# Patient Record
Sex: Female | Born: 1960
Health system: Southern US, Community
[De-identification: ages and names within clinical notes are randomized; demographics above are authoritative.]

## PROBLEM LIST (undated history)

## (undated) DIAGNOSIS — F329 Major depressive disorder, single episode, unspecified: Secondary | ICD-10-CM

## (undated) DIAGNOSIS — I10 Essential (primary) hypertension: Secondary | ICD-10-CM

## (undated) DIAGNOSIS — F32A Depression, unspecified: Secondary | ICD-10-CM

## (undated) DIAGNOSIS — E785 Hyperlipidemia, unspecified: Secondary | ICD-10-CM

## (undated) HISTORY — DX: Hyperlipidemia, unspecified: E78.5

## (undated) HISTORY — DX: Major depressive disorder, single episode, unspecified: F32.9

## (undated) HISTORY — DX: Essential (primary) hypertension: I10

## (undated) HISTORY — DX: Depression, unspecified: F32.A

---

## 1998-08-15 ENCOUNTER — Other Ambulatory Visit: Admission: RE | Admit: 1998-08-15 | Discharge: 1998-08-15 | Payer: Self-pay | Admitting: *Deleted

## 1999-09-07 ENCOUNTER — Other Ambulatory Visit: Admission: RE | Admit: 1999-09-07 | Discharge: 1999-09-07 | Payer: Self-pay | Admitting: *Deleted

## 2000-10-09 ENCOUNTER — Other Ambulatory Visit: Admission: RE | Admit: 2000-10-09 | Discharge: 2000-10-09 | Payer: Self-pay | Admitting: *Deleted

## 2002-04-16 HISTORY — PX: ABDOMINAL HYSTERECTOMY: SHX81

## 2002-04-16 HISTORY — PX: TOTAL ABDOMINAL HYSTERECTOMY: SHX209

## 2008-05-27 ENCOUNTER — Ambulatory Visit: Payer: Self-pay | Admitting: Family Medicine

## 2008-05-27 DIAGNOSIS — G4700 Insomnia, unspecified: Secondary | ICD-10-CM | POA: Insufficient documentation

## 2008-05-27 DIAGNOSIS — I10 Essential (primary) hypertension: Secondary | ICD-10-CM | POA: Insufficient documentation

## 2008-05-27 DIAGNOSIS — E785 Hyperlipidemia, unspecified: Secondary | ICD-10-CM | POA: Insufficient documentation

## 2008-05-27 DIAGNOSIS — F341 Dysthymic disorder: Secondary | ICD-10-CM | POA: Insufficient documentation

## 2008-07-19 ENCOUNTER — Ambulatory Visit: Payer: Self-pay | Admitting: Family Medicine

## 2008-08-02 ENCOUNTER — Telehealth (INDEPENDENT_AMBULATORY_CARE_PROVIDER_SITE_OTHER): Payer: Self-pay | Admitting: *Deleted

## 2008-08-12 ENCOUNTER — Ambulatory Visit: Payer: Self-pay | Admitting: Family Medicine

## 2008-10-19 ENCOUNTER — Ambulatory Visit: Payer: Self-pay | Admitting: Occupational Medicine

## 2008-12-31 ENCOUNTER — Ambulatory Visit: Payer: Self-pay | Admitting: Family Medicine

## 2009-01-02 LAB — CONVERTED CEMR LAB
Alkaline Phosphatase: 61 units/L (ref 39–117)
BUN: 20 mg/dL (ref 6–23)
CO2: 26 meq/L (ref 19–32)
Cholesterol: 300 mg/dL — ABNORMAL HIGH (ref 0–200)
Creatinine, Ser: 1.06 mg/dL (ref 0.40–1.20)
Folate: >20 ng/mL
Glucose, Bld: 95 mg/dL (ref 70–99)
HDL: 53 mg/dL (ref >39)
LDL Cholesterol: 193 mg/dL — ABNORMAL HIGH (ref 0–99)
Sodium: 142 meq/L (ref 135–145)
Total Bilirubin: 0.4 mg/dL (ref 0.3–1.2)
Total Protein: 7.7 g/dL (ref 6.0–8.3)
Triglycerides: 268 mg/dL — ABNORMAL HIGH (ref <150)
VLDL: 54 mg/dL — ABNORMAL HIGH (ref 0–40)

## 2009-04-23 ENCOUNTER — Ambulatory Visit: Payer: Self-pay | Admitting: Family Medicine

## 2009-04-25 ENCOUNTER — Encounter: Payer: Self-pay | Admitting: Family Medicine

## 2009-04-29 ENCOUNTER — Ambulatory Visit: Payer: Self-pay | Admitting: Family Medicine

## 2009-05-03 ENCOUNTER — Telehealth: Payer: Self-pay | Admitting: Family Medicine

## 2009-09-06 ENCOUNTER — Ambulatory Visit: Payer: Self-pay | Admitting: Family Medicine

## 2009-09-06 ENCOUNTER — Encounter: Payer: Self-pay | Admitting: Family Medicine

## 2009-11-08 ENCOUNTER — Encounter: Payer: Self-pay | Admitting: Family Medicine

## 2010-01-06 ENCOUNTER — Telehealth: Payer: Self-pay | Admitting: Family Medicine

## 2010-01-21 ENCOUNTER — Ambulatory Visit: Payer: Self-pay | Admitting: Emergency Medicine

## 2010-01-26 ENCOUNTER — Encounter: Payer: Self-pay | Admitting: Family Medicine

## 2010-01-27 ENCOUNTER — Telehealth (INDEPENDENT_AMBULATORY_CARE_PROVIDER_SITE_OTHER): Payer: Self-pay | Admitting: *Deleted

## 2010-01-27 ENCOUNTER — Ambulatory Visit: Payer: Self-pay | Admitting: Family Medicine

## 2010-02-03 ENCOUNTER — Encounter: Payer: Self-pay | Admitting: Family Medicine

## 2010-04-11 ENCOUNTER — Encounter: Payer: Self-pay | Admitting: Family Medicine

## 2010-04-11 LAB — CONVERTED CEMR LAB: Cholesterol: 336 mg/dL

## 2010-04-19 ENCOUNTER — Encounter: Payer: Self-pay | Admitting: Family Medicine

## 2010-05-14 LAB — CONVERTED CEMR LAB
Cholesterol: 266 mg/dL
HDL: 51 mg/dL
LDL Cholesterol: 156 mg/dL

## 2010-05-18 NOTE — Assessment & Plan Note (Signed)
Summary: THYORID CHECK for possible thyroiditis   Vital Signs:  Patient profile:   50 year old female Height:      62.5 inches Weight:      157 pounds Pulse rate:   69 / minute BP sitting:   107 / 64  (left arm) Cuff size:   regular  Vitals Entered By: Kathlene November (April 29, 2009 8:09 AM) CC: seen at [facility] Saturday for sore throat, chills and achy- told had virus on thyroid- feeling better but still tired   Primary Care Provider:  Nani Gasser, MD   CC:  seen at [facility] Saturday for sore throat and chills and achy- told had virus on thyroid- feeling better but still tired.  History of Present Illness: seen at [facility] Saturday for sore throat, chills and achy- told had virus on thyroid- feeling better but still tired.  Had had ST for about a week.  Had tenderness over her thyroid gland.   Is is better. Feels it responded pretty quickly to the steroids. No fever. Throat still a little sore.   Current Medications (verified): 1)  Diovan 80 Mg Tabs (Valsartan) .... Take One Tablet By Mouth Once A Day 2)  Triamterene-Hctz 37.5-25 Mg Tabs (Triamterene-Hctz) .... Take One Tablet By Mouth Once A Day 3)  Fluvoxamine Maleate 100 Mg Tabs (Fluvoxamine Maleate) .... Take One Tablet By Mouth Once A Day 4)  Trazodone Hcl 50 Mg Tabs (Trazodone Hcl) .... Take 1/2 To One  Tablet By Mouth Daily 5)  Alprazolam 0.5 Mg Tabs (Alprazolam) .... Take One Tablet By Mouth Once A Day 6)  Maxifed .... Twice A Day 7)  Simvastatin 80 Mg Tabs (Simvastatin) .... Take 1 Tablet By Mouth Once A Day At [facility]  Allergies (verified): 1)  ! * Lisinopril  Comments:  Nurse/Medical Assistant: The patient's medications and allergies were reviewed with the patient and were updated in the Medication and Allergy Lists. Kathlene November (April 29, 2009 8:10 AM)  Physical Exam  General:  Well-developed,well-nourished,in no acute distress; alert,appropriate and cooperative throughout examination Head:  Normocephalic and atraumatic  without obvious abnormalities. No apparent alopecia or balding. Eyes:  No corneal or conjunctival inflammation noted. EOMI. Perrla.  Ears:  External ear exam shows no significant lesions or deformities.  Otoscopic examination reveals clear canals, tympanic membranes are intact bilaterally without bulging, retraction, inflammation or discharge. Hearing is grossly normal bilaterally. Nose:  External nasal examination shows no deformity or inflammation. Nasal mucosa are pink and moist without lesions or exudates. Mouth:  Oral mucosa and oropharynx without lesions or exudates.  Teeth in good repair. Neck:  No deformities, masses, or tenderness noted. Thyroid gland no enlarged or swollen. Stil a little tender on the left side.  Lungs:  Normal respiratory effort, chest expands symmetrically. Lungs are clear to auscultation, no crackles or wheezes. Heart:  Normal rate and regular rhythm. S1 and S2 normal without gallop, murmur, click, rub or other extra sounds. Skin:  no rashes.   Psych:  Cognition and judgment appear intact. Alert and cooperative with normal attention span and concentration. No apparent delusions, illusions, hallucinations   Impression & Recommendations:  Problem # 1:  UNSPECIFIED THYROIDITIS (ICD-245.9) She is still alittle tender on the left side. Overall likely viral but will check thyroid levels today. If abnormal then will recheck in 2 weeks. If normal then likely not her thyroid gland. Orders: T-TSH [phone]) T-T3, Free [phone]) T-T4, Free [phone]) T-TSH Antibodies  (5500)  Problem # 2:  ACUTE PHARYNGITIS (ICD-462)  Likely viral. Cutlure was neg.  Symptomatic care. May take another 5 days to completely resolved.   Complete Medication List: 1)  Diovan 80 Mg Tabs (Valsartan) .... Take one tablet by mouth once a day 2)  Triamterene-hctz 37.5-25 Mg Tabs (Triamterene-hctz) .... Take one tablet by mouth once a day 3)  Fluvoxamine Maleate 100 Mg Tabs (Fluvoxamine  maleate) .... Take one tablet by mouth once a day 4)  Trazodone Hcl 50 Mg Tabs (Trazodone hcl) .... Take 1/2 to one  tablet by mouth daily 5)  Alprazolam 0.5 Mg Tabs (Alprazolam) .... Take one tablet by mouth once a day 6)  Maxifed  .... Twice a day 7)  Simvastatin 80 Mg Tabs (Simvastatin) .... Take 1 tablet by mouth once a day at hs

## 2010-05-18 NOTE — Assessment & Plan Note (Signed)
Summary: CONGESTION/TM   Vital Signs:  Patient Profile:   50 Years Old Female Height:     62.5 inches O2 Sat:      98 % O2 treatment:    Room Air Temp:     98.5 degrees F oral Pulse rate:   72 / minute Pulse rhythm:   regular Resp:     18 per minute BP sitting:   118 / 92  (left arm)                  Prior Medications Reviewed Using: Patient Recall  Current Allergies: ! * LISINOPRILHistory of Present Illness History from: patient History of Present Illness: Patient complains of onset of cold symptoms for 5 days.  They have been using Tessalon which is helping a little bit.  She is taking care of a cancer patient now and is mostly concerned with being contagious. + sore throat + cough No pleuritic pain No wheezing + nasal congestion + post-nasal drainage + sinus pain/pressure No itchy/red eyes No earache No hemoptysis No SOB No chills/sweats No fever No nausea No vomiting No abdominal pain No diarrhea No skin rashes No fatigue No myalgias + headache    Physical Exam General appearance: well developed, well nourished, no acute distress Ears: normal, no lesions or deformities Nasal: clear discharge Oral/Pharynx: pharyngeal erythema without exudate, uvula midline without deviation.  +clear PND Neck: neck supple,  trachea midline, no masses Chest/Lungs: no rales, wheezes, or rhonchi bilateral, breath sounds equal without effort Heart: regular rate and  rhythm, no murmur Skin: no obvious rashes or lesions MSE: oriented to time, place, and person Assessment New Problems: UPPER RESPIRATORY INFECTION, ACUTE (ICD-465.9)   Patient Education: Patient and/or caregiver instructed in the following: rest, fluids, Tylenol prn. wash hands frequently  Plan New Medications/Changes: CHERATUSSIN AC 100-10 MG/5ML SYRP (GUAIFENESIN-CODEINE) 5-10cc by mouth every 6 hours as needed for cough  #6oz x 0, 01/21/2010, Hoyt Koch MD ZITHROMAX Z-PAK 250 MG TABS  (AZITHROMYCIN) use as directed  #1 x 0, 01/21/2010, Hoyt Koch MD  New Orders: Est. Patient Level II [09811] Planning Comments:   Follow-up with your primary care physician if not improving or if getting worse   The patient and/or caregiver has been counseled thoroughly with regard to medications prescribed including dosage, schedule, interactions, rationale for use, and possible side effects and they verbalize understanding.  Diagnoses and expected course of recovery discussed and will return if not improved as expected or if the condition worsens. Patient and/or caregiver verbalized understanding.  Prescriptions: CHERATUSSIN AC 100-10 MG/5ML SYRP (GUAIFENESIN-CODEINE) 5-10cc by mouth every 6 hours as needed for cough  #6oz x 0   Entered and Authorized by:   Hoyt Koch MD   Signed by:   Hoyt Koch MD on 01/21/2010   Method used:   Print then Give to Patient   RxID:   [id number] ZITHROMAX Z-PAK 250 MG TABS (AZITHROMYCIN) use as directed  #1 x 0   Entered and Authorized by:   Hoyt Koch MD   Signed by:   Hoyt Koch MD on 01/21/2010   Method used:   Print then Give to Patient   RxID:   [id number]   Orders Added: 1)  Est. Patient Level II [40102]

## 2010-05-18 NOTE — Progress Notes (Signed)
Summary: Lab results  Phone Note Outgoing Call   Summary of Call: Call pt: Labs show thyroid is completelynormal so no need to recheck.  Normal liver and kidney and cholesterol. Blood sugar was borderline at 101, so rec recheck in 6 months the sugar to make sure stable.  Initial call taken by: Nani Gasser MD,  May 03, 2009 1:03 PM  Follow-up for Phone Call        Pt notified of results.  Follow-up by: Kathlene November,  May 04, 2009 8:00 AM

## 2010-05-18 NOTE — Assessment & Plan Note (Signed)
Summary: Achy, chills, Sorethroat x 3 dys rm 3   Vital Signs:  Patient Profile:   50 Years Old Female CC:      Cold & URI symptoms Height:     62.5 inches Weight:      156 pounds O2 Sat:      100 % O2 treatment:    Room Air Temp:     98 degrees F oral Pulse rate:   83 / minute Pulse rhythm:   regular Resp:     16 per minute BP sitting:   115 / 77  (right arm) Cuff size:   regular  Vitals Entered By: Areta Haber CMA (April 23, 2009 10:21 AM)                  Current Allergies: ! * LISINOPRIL    History of Present Illness Chief Complaint: Cold & URI symptoms History of Present Illness: Subjective: Patient complains of sore throat for 3 days. No cough No pleuritic pain No wheezing No nasal congestion No post-nasal drainage No sinus pain/pressure No itchy/red eyes No earache No hemoptysis No SOB No fever/chills No nausea No vomiting No abdominal pain No diarrhea No skin rashes + fatigue + myalgias No headache Used OTC meds without relief   Current Problems: ACUTE PHARYNGITIS (ICD-462) ROUTINE GYNECOLOGICAL EXAMINATION (ICD-V72.31) URI (ICD-465.9) ACUTE MAXILLARY SINUSITIS (ICD-461.0) HRT (ICD-V07.4) SINUSITIS- ACUTE-NOS (ICD-461.9) HYPERLIPIDEMIA (ICD-272.4) INSOMNIA (ICD-780.52) ANXIETY DEPRESSION (ICD-300.4) HYPERTENSION, BENIGN (ICD-401.1)   Current Meds DIOVAN 80 MG TABS (VALSARTAN) Take one tablet by mouth once a day TRIAMTERENE-HCTZ 37.5-25 MG TABS (TRIAMTERENE-HCTZ) Take one tablet by mouth once a day FLUVOXAMINE MALEATE 100 MG TABS (FLUVOXAMINE MALEATE) Take one tablet by mouth once a day TRAZODONE HCL 50 MG TABS (TRAZODONE HCL) Take 1/2 to one  tablet by mouth daily ALPRAZOLAM 0.5 MG TABS (ALPRAZOLAM) Take one tablet by mouth once a day * MAXIFED twice a day SIMVASTATIN 80 MG TABS (SIMVASTATIN) Take 1 tablet by mouth once a day at HS ERYTHROMYCIN 5 MG/GM OINT (ERYTHROMYCIN) Apply a ribbon of ointment in the lower eyelid twice a  day for 5 days. PREDNISONE 10 MG TABS (PREDNISONE) 2 PO BID for 2 days, then 1 BID for 2 days, then 1 daily for 2 days.  Take PC  REVIEW OF SYSTEMS Constitutional Symptoms       Complains of chills and fatigue.     Denies fever, night sweats, weight loss, and weight gain.  Eyes       Denies change in vision, eye pain, eye discharge, glasses, contact lenses, and eye surgery. Ear/Nose/Throat/Mouth       Complains of sore throat.      Denies hearing loss/aids, change in hearing, ear pain, ear discharge, dizziness, frequent runny nose, frequent nose bleeds, sinus problems, hoarseness, and tooth pain or bleeding.      Comments: x3 dys Respiratory       Denies dry cough, productive cough, wheezing, shortness of breath, asthma, bronchitis, and emphysema/COPD.  Cardiovascular       Denies murmurs, chest pain, and tires easily with exhertion.    Gastrointestinal       Denies stomach pain, nausea/vomiting, diarrhea, constipation, blood in bowel movements, and indigestion. Genitourniary       Denies painful urination, kidney stones, and loss of urinary control. Neurological       Denies paralysis, seizures, and fainting/blackouts. Musculoskeletal       Denies muscle pain, joint pain, joint stiffness, decreased range of motion, redness, swelling,  muscle weakness, and gout.  Skin       Denies bruising, unusual mles/lumps or sores, and hair/skin or nail changes.  Psych       Denies mood changes, temper/anger issues, anxiety/stress, speech problems, depression, and sleep problems. Other Comments: Pt has not seen PCP   Past History:  Past Medical History: Last updated: 12/31/2008 high blood pressure high cholesterol depression Dr. Abbe Amsterdam - GYN.  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 Single.  denies drinking denies smoking denies recreational drug use  Risk Factors: Caffeine Use: 1 (05/27/2008)  Risk Factors: Smoking Status: never (05/27/2008) Passive Smoke Exposure: no (05/27/2008)   Objective:  Appearance:  Patient appears healthy, stated age, and in no acute distress  Eyes:  Pupils are equal, round, and reactive to light and accomdation.  Extraocular movement is intact.  Conjunctivae are not inflamed.  Ears:  Canals normal.  Tympanic membranes normal.   Nose:  Normal septum.  Normal turbinates, mildly congested.   No sinus tenderness present.  Pharynx:  Normal  Neck:  Supple.  Anterior/posterior nodes are tender but not enlarged.  Thyroid is tender to palpation but not enlarged Lungs:  Clear to auscultation.  Breath sounds are equal.  Heart:  Regular rate and rhythm without murmurs, rubs, or gallops.  Abdomen:  Nontender without masses or hepatosplenomegaly.  Bowel sounds are present.  No CVA or flank tenderness.  Rapid strep test negative  Assessment New Problems: ACUTE PHARYNGITIS (ICD-462)  SUSPECT EARLY VIRAL URI AND THYROID INFLAMMATION  Plan New Medications/Changes: PREDNISONE 10 MG TABS (PREDNISONE) 2 PO BID for 2 days, then 1 BID for 2 days, then 1 daily for 2 days.  Take PC  #14 x 0, 04/23/2009, Donna Christen MD  New Orders: Rapid Strep [[id number]] T-Culture, Throat [[id number]] Est. Patient Level III [09811] Planning Comments:   Begin tapering dose of prednisone.  Throat culture pending. Follow-up with PCP if not improving 5 to 7days.   The patient and/or caregiver has been counseled thoroughly with regard to medications prescribed including dosage, schedule, interactions, rationale for use, and possible side effects and they verbalize understanding.  Diagnoses and expected course of recovery discussed and will return if not improved as expected or if the condition worsens. Patient and/or caregiver  verbalized understanding.  Prescriptions: PREDNISONE 10 MG TABS (PREDNISONE) 2 PO BID for 2 days, then 1 BID for 2 days, then 1 daily for 2 days.  Take PC  #14 x 0   Entered and Authorized by:   Donna Christen MD   Signed by:   Donna Christen MD on 04/23/2009   Method used:   Print then Give to Patient   RxID:   [id number]   Laboratory Results  Date/Time Received: April 23, 2009 10:40 AM  Date/Time Reported: April 23, 2009 10:40 AM   Other Tests  Rapid Strep: negative  Kit Test Internal QC: Negative   (Normal Range: Negative)

## 2010-05-18 NOTE — Miscellaneous (Signed)
Summary: lipids  Clinical Lists Changes  Medications: Changed medication from CRESTOR 10 MG TABS (ROSUVASTATIN CALCIUM) Take 1 tablet by mouth once a day at bedtime to SIMVASTATIN 40 MG TABS (SIMVASTATIN) Take 1 tablet by mouth once a day - Signed Rx of SIMVASTATIN 40 MG TABS (SIMVASTATIN) Take 1 tablet by mouth once a day;  #30 x 6;  Signed;  Entered by: Nani Gasser MD;  Authorized by: Nani Gasser MD;  Method used: Electronically to [facility] [phone]*, [address] [place], [place]  [address], Ph: [phone], Fax: [phone] Allergies: Added new allergy or adverse reaction of CRESTOR (ROSUVASTATIN CALCIUM) Observations: Added new observation of TRIGLYCERIDE: 213 mg/dL (32/35/5732 2:02) Added new observation of HDL: 55 mg/dL (54/27/0623 7:62) Added new observation of LDL: 238 mg/dL (83/15/1761 6:07) Added new observation of CHOLESTEROL: 336 mg/dL (37/01/6268 4:85)    Prescriptions: SIMVASTATIN 40 MG TABS (SIMVASTATIN) Take 1 tablet by mouth once a day  #30 x 6   Entered and Authorized by:   Nani Gasser MD   Signed by:   Nani Gasser MD on 04/21/2010   Method used:   Electronically to        [facility] [phone]* (retail)       [address]       [place], [place]  [address]       Ph: [phone]       Fax: [phone]   RxID:   [id number]    Lipid Panel Test Date: 04/11/2010                        Value        Units        H/L   Reference  Cholesterol:          336          mg/dL         H    (235-361) LDL Cholesterol:      238          mg/dL         H    (44-315) HDL Cholesterol:      55           mg/dL              (40-08) Triglyceride:         213          mg/dL              (67-619)   Call pt: Did she start the crestor?   January  4, 20128:58 AM Levonte Molina MD, Santina Evans   1:57 PM April 19, 2010 McCrimmon CMA, Duncan Dull), Sue Lush called and left message withj pt's mother to call us back in re starting the  crestor  04/21/2010 @ 9:35am- Pt returned call and states she took the Crestor but has stopped it for sometime now because it made her ache all ocer and not feel good. York Spaniel has used Simvastatin in past and tolerated this ok if this is something you would consider giving her.KJ LPN

## 2010-05-18 NOTE — Progress Notes (Signed)
Summary: Simvastatin  Phone Note From Pharmacy Call back at [phone]   [facility]er: [facility] Call For: [place]  Summary of Call: Alot of risk of myopathy too high for Simvastatin 80mg . Do you want to contiune at this dose or rx something else. Pharmacist states they have to call and if you want to continue with the dose prescribed they just have to document you are aware and wish to continue med. Initial call taken by: Kathlene November,  January 06, 2010 8:47 AM  Follow-up for Phone Call        Yes, would like to change her to lipitor 40mg  nightly. Call pt and ler her know also have changed her medication. New sent to pharmacy.   Seh can go online to their website and request a $4 copay card so it will on ly cost $4.  Follow-up by: Nani Gasser MD,  January 06, 2010 8:59 AM  Additional Follow-up for Phone Call Additional follow up Details #1::        Pt notified Additional Follow-up by: Kathlene November,  January 06, 2010 9:06 AM    New/Updated Medications: LIPITOR 40 MG TABS (ATORVASTATIN CALCIUM) Take 1 tablet by mouth once a day at bedtime Prescriptions: LIPITOR 40 MG TABS (ATORVASTATIN CALCIUM) Take 1 tablet by mouth once a day at bedtime  #30 x 3   Entered by:   Kathlene November   Authorized by:   Nani Gasser MD   Signed by:   Kathlene November on 01/06/2010   Method used:   Electronically to        [facility] [phone]* (retail)       [address]       [place], [place]  [address]       Ph: [phone]       Fax: [phone]   RxID:   [id number] LIPITOR 40 MG TABS (ATORVASTATIN CALCIUM) Take 1 tablet by mouth once a day at bedtime  #30 x 3   Entered and Authorized by:   Nani Gasser MD   Signed by:   Nani Gasser MD on 01/06/2010   Method used:   Electronically to        [facility] [phone]* (retail)       [address] [place], [place]  [address]       Ph: [phone]       Fax: [phone]   RxID:   [id number]

## 2010-05-18 NOTE — Progress Notes (Signed)
----   Converted from flag ---- ---- 01/27/2010 12:17 PM, Nani Gasser MD wrote: Call pt:Let her know will mail her the lab sli pfor cholesterol so she can get it checked in one month. ------------------------------  01/27/10 called and left pt message stating we will mail rx an sent lab in the mail

## 2010-05-18 NOTE — Progress Notes (Signed)
Summary: Cholesterol med change  Phone Note From Pharmacy   Caller: Patient Call For: Nani Gasser MD Summary of Call: Pharmacist called back submitted the cholesterol med Lipitor and they recommend Crestor first 20mg . [facility]er: [facility] Call For: [place]  Summary of Call: When submitted the Lipitor rx insurance came back and said pt needs to try Crestor 20mg  first. Initial call taken by: Kathlene November,  January 06, 2010 10:00 AM  Follow-up for Phone Call        OK will send over crestor.  Follow-up by: Nani Gasser MD,  January 06, 2010 10:11 AM    New/Updated Medications: CRESTOR 5 MG TABS (ROSUVASTATIN CALCIUM) Take 1 tablet by mouth once a day at bedtime Prescriptions: CRESTOR 5 MG TABS (ROSUVASTATIN CALCIUM) Take 1 tablet by mouth once a day at bedtime  #90 x 1   Entered and Authorized by:   Nani Gasser MD   Signed by:   Nani Gasser MD on 01/06/2010   Method used:   Electronically to        [facility] [phone]* (retail)       [address]       [place], [place]  [address]       Ph: [phone]       Fax: [phone]   RxID:   [id number]

## 2010-05-18 NOTE — Assessment & Plan Note (Signed)
Summary: SINUS PAIN/PRESSURE/TJ   Vital Signs:  Patient Profile:   50 Years Old Female CC:      runny nose, productive cough, X 4 days, fatigue abdominal discomfort X today Height:     62.5 inches Weight:      149 pounds O2 Sat:      96 % O2 treatment:    Room Air Temp:     98.1 degrees F oral Pulse rate:   76 / minute Pulse rhythm:   regular Resp:     16 per minute BP sitting:   120 / 76  (right arm) Cuff size:   regular  Pt. in pain?   no  Vitals Entered By: Lajean Saver RN (Sep 06, 2009 5:37 PM)                   Updated Prior Medication List: DIOVAN 80 MG TABS (VALSARTAN) Take one tablet by mouth once a day HYDROCHLOROTHIAZIDE 25 MG TABS (HYDROCHLOROTHIAZIDE) 1 tab by mouth qAM FLUVOXAMINE MALEATE 100 MG TABS (FLUVOXAMINE MALEATE) Take one tablet by mouth once a day TRAZODONE HCL 50 MG TABS (TRAZODONE HCL) Take 1/2 to one  tablet by mouth daily ALPRAZOLAM 0.5 MG TABS (ALPRAZOLAM) Take one tablet by mouth once a day SIMVASTATIN 80 MG TABS (SIMVASTATIN) once daily TYLENOL 325 MG TABS (ACETAMINOPHEN) as needed symptoms  Current Allergies (reviewed today): ! * LISINOPRILHistory of Present Illness Chief Complaint: runny nose, productive cough, X 4 days, fatigue abdominal discomfort X today History of Present Illness: Subjective: Patient complains of URI symptoms that started 4 days ago with runny nose, sneezing, and mild sore throat (now resolved). Now has a productive cough No pleuritic pain No wheezing No post-nasal drainage No sinus pain/pressure No itchy/red eyes No earache No hemoptysis No SOB No fever, + chills No nausea No vomiting No abdominal pain No diarrhea No skin rashes + fatigue + myalgias initially No headache Used OTC meds without relief   REVIEW OF SYSTEMS Constitutional Symptoms       Complains of fatigue.     Denies fever, chills, night sweats, weight loss, and weight gain.  Eyes       Denies change in vision, eye pain, eye  discharge, glasses, contact lenses, and eye surgery. Ear/Nose/Throat/Mouth       Complains of frequent runny nose, sinus problems, and hoarseness.      Denies hearing loss/aids, change in hearing, ear pain, ear discharge, dizziness, frequent nose bleeds, sore throat, and tooth pain or bleeding.      Comments: sore throat X 2 days-resolved Respiratory       Complains of productive cough and wheezing.      Denies dry cough, shortness of breath, asthma, bronchitis, and emphysema/COPD.  Cardiovascular       Denies murmurs, chest pain, and tires easily with exhertion.    Gastrointestinal       Denies stomach pain, nausea/vomiting, diarrhea, constipation, blood in bowel movements, and indigestion. Genitourniary       Denies painful urination, kidney stones, and loss of urinary control. Neurological       Complains of headaches.      Denies paralysis, seizures, and fainting/blackouts. Musculoskeletal       Denies muscle pain, joint pain, joint stiffness, decreased range of motion, redness, swelling, muscle weakness, and gout.  Skin       Denies bruising, unusual mles/lumps or sores, and hair/skin or nail changes.  Psych       Denies mood changes, temper/anger  issues, anxiety/stress, speech problems, depression, and sleep problems.  Past History:  Past Medical History: Reviewed history from 12/31/2008 and no changes required. high blood pressure high cholesterol depression Dr. Abbe Amsterdam - GYN.  Past Surgical History: Reviewed history from 05/27/2008 and no changes required. [facility] 2004 @ [facility]  Family History: Reviewed history from 12/31/2008 and no changes required. Aunt with Breast  Cancer mother and father deceased brother alive and healthy  Social History: Reviewed history from 07/19/2008 and no changes required. 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  denies drinking denies smoking denies recreational drug use   Objective:  Appearance:  Patient appears  healthy, stated age, and in no acute distress  Eyes:  Pupils are equal, round, and reactive to light and accomdation.  Extraocular movement is intact.  Conjunctivae are not inflamed.  Ears:  Canals normal.  Tympanic membranes normal.   Nose:  Normal septum.  Normal turbinates, mildly congested.   No sinus tenderness present.  Pharynx:  Normal  Neck:  Supple.  No adenopathy is present.  Lungs:  Clear to auscultation.  Breath sounds are equal.  Heart:  Regular rate and rhythm with Gr 2/4 SEM LSB (not new) Abdomen:  Nontender without masses or hepatosplenomegaly.  Bowel sounds are present.  No CVA or flank tenderness.  Extremities:  No edema.   Assessment New Problems: URI (ICD-465.9)  VIRAL URI  Plan New Medications/Changes: AZITHROMYCIN 250 MG TABS (AZITHROMYCIN) Two tabs by mouth on day 1, then 1 tab daily on days 2 through 5 (Rx void after 09/14/09)  #6 tabs x 0, 09/06/2009, Donna Christen MD BENZONATATE 200 MG CAPS (BENZONATATE) One by mouth hs as needed cough  #12 x 0, 09/06/2009, Donna Christen MD  New Orders: Est. Patient Level III [phone] Planning Comments:   Treat symptomatically for now:  expectorant, topical decongestant, fluids. cough suppressant at bedtime. If develops facial pain or fever after several days, begin Z-pack (given Rx to hold).  Follow-up with PCP if not improving.   The patient and/or caregiver has been counseled thoroughly with regard to medications prescribed including dosage, schedule, interactions, rationale for use, and possible side effects and they verbalize understanding.  Diagnoses and expected course of recovery discussed and will return if not improved as expected or if the condition worsens. Patient and/or caregiver verbalized understanding.  Prescriptions: AZITHROMYCIN 250 MG TABS (AZITHROMYCIN) Two tabs by mouth on day 1, then 1 tab daily on days 2 through 5 (Rx void after 09/14/09)  #6 tabs x 0   Entered and Authorized by:   Donna Christen MD   Signed  by:   Donna Christen MD on 09/06/2009   Method used:   Print then Give to Patient   RxID:   [id number] BENZONATATE 200 MG CAPS (BENZONATATE) One by mouth hs as needed cough  #12 x 0   Entered and Authorized by:   Donna Christen MD   Signed by:   Donna Christen MD on 09/06/2009   Method used:   Print then Give to Patient   RxID:   [id number]   Patient Instructions: 1)  May use Mucinex  (guaifenesin) twice daily for congestion, or use plain Robitussin several times daily. 2)  Increase fluid intake, rest. 3)  May use Afrin nasal spray (or generic oxymetazoline) twice daily for about 5 days.  Also recommend using saline nasal spray several times daily and/or saline nasal irrigation. 4)  Add azithromycin if facial pain or fever develop 5)  Followup with family  doctor if not improving 7 to 10 days.

## 2010-05-18 NOTE — Miscellaneous (Signed)
Summary: change Maxzide to HCTZ  Clinical Lists Changes  Medications: Changed medication from TRIAMTERENE-HCTZ 37.5-25 MG TABS (TRIAMTERENE-HCTZ) Take one tablet by mouth once a day to HYDROCHLOROTHIAZIDE 25 MG TABS (HYDROCHLOROTHIAZIDE) 1 tab by mouth qAM - Signed Rx of HYDROCHLOROTHIAZIDE 25 MG TABS (HYDROCHLOROTHIAZIDE) 1 tab by mouth qAM;  #30 x 3;  Signed;  Entered by: Seymour Bars DO;  Authorized by: Seymour Bars DO;  Method used: Electronically to [id number]*, [address] [place], [place]  [address], Ph: [phone], Fax: [phone]    Prescriptions: HYDROCHLOROTHIAZIDE 25 MG TABS (HYDROCHLOROTHIAZIDE) 1 tab by mouth qAM  #30 x 3   Entered and Authorized by:   Seymour Bars DO   Signed by:   Seymour Bars DO on 09/06/2009   Method used:   Electronically to        [facility] [phone]* (retail)       [address] [place], [place]  [address]       Ph: [phone]       Fax: [phone]   RxID:   [id number]   Appended Document: change Maxzide to HCTZ Pls let pt know that i changed her Maxzide to HCTZ b/c they aren't making Maxzide right now.  Seymour Bars, D.O.  Appended Document: change Maxzide to HCTZ Pt notified. KJ LPN

## 2010-05-18 NOTE — Miscellaneous (Signed)
  Clinical Lists Changes  Medications: Removed medication of HYDROCHLOROTHIAZIDE 25 MG TABS (HYDROCHLOROTHIAZIDE) 1 tab by mouth qAM Added new medication of TRIAMTERENE-HCTZ 37.5-25 MG TABS (TRIAMTERENE-HCTZ) Take one tablet by mouth once a day - Signed Rx of TRIAMTERENE-HCTZ 37.5-25 MG TABS (TRIAMTERENE-HCTZ) Take one tablet by mouth once a day;  #30 x 4;  Signed;  Entered by: Kathlene November;  Authorized by: Nani Gasser MD;  Method used: Electronically to [facility] [address]*, [address] [place], [place]  [address], Ph: [phone], Fax: [phone]   Pt came in office BP-121/70 Prescriptions: TRIAMTERENE-HCTZ 37.5-25 MG TABS (TRIAMTERENE-HCTZ) Take one tablet by mouth once a day  #30 x 4   Entered by:   Kathlene November   Authorized by:   Nani Gasser MD   Signed by:   Kathlene November on 11/08/2009   Method used:   Electronically to        [facility] [phone]* (retail)       [address] [place], [place]  [address]       Ph: [phone]       Fax: [phone]   RxID:   [id number]

## 2010-05-18 NOTE — Assessment & Plan Note (Signed)
Summary: CPE   Vital Signs:  Patient profile:   50 year old female Height:      62.5 inches Weight:      148 pounds BMI:     26.73 Pulse rate:   91 / minute BP sitting:   98 / 61  (right arm) Cuff size:   regular  Vitals Entered By: Avon Gully CMA, Duncan Dull) (January 27, 2010 11:03 AM) CC: CPE no pap   Primary Care Provider:  Nani Gasser, MD   CC:  CPE no pap.  History of Present Illness: Here for CPE. Does have a GYN who orders her mammogram. She says she is due soon Hx of hysterectomy.  Also wants to discusse her simvastain 80mg . See the phone notes. Had tried lipitor in the past and caused muscles aches. Says tried [employer] as well but didn't work to bring her cholesterol down.   Habits & Providers  Exercise-Depression-Behavior     Does Patient Exercise: yes  Current Medications (verified): 1)  Diovan 80 Mg Tabs (Valsartan) .... Take One Tablet By Mouth Once A Day 2)  Fluvoxamine Maleate 100 Mg Tabs (Fluvoxamine Maleate) .... Take One Tablet By Mouth Once A Day 3)  Trazodone Hcl 50 Mg Tabs (Trazodone Hcl) .... Take 1/2 To One  Tablet By Mouth Daily 4)  Alprazolam 0.5 Mg Tabs (Alprazolam) .... Take One Tablet By Mouth Once A Day 5)  Crestor 5 Mg Tabs (Rosuvastatin Calcium) .... Take 1 Tablet By Mouth Once A Day At Bedtime 6)  Tylenol 325 Mg Tabs (Acetaminophen) .... As Needed Symptoms 7)  Benzonatate 200 Mg Caps (Benzonatate) .... One By Mouth Hs As Needed Cough 8)  Triamterene-Hctz 37.5-25 Mg Tabs (Triamterene-Hctz) .... Take One Tablet By Mouth Once A Day 9)  Zithromax Z-Pak 250 Mg Tabs (Azithromycin) .... Use As Directed 10)  Cheratussin Ac 100-10 Mg/34ml Syrp (Guaifenesin-Codeine) .... 5-10cc By Mouth Every 6 Hours As Needed For Cough  Allergies (verified): 1)  ! * Lisinopril 2)  ! Lipitor (Atorvastatin Calcium)  Comments:  Nurse/Medical Assistant: The patient's medications and allergies were reviewed with the patient and were updated in the Medication  and Allergy Lists. 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    Physical Exam  General:  Well-developed,well-nourished,in no acute distress; alert,appropriate and cooperative throughout examination Head:  Normocephalic and atraumatic without obvious abnormalities. No apparent alopecia or balding. Eyes:  No corneal or conjunctival inflammation noted. EOMI. Perrla. Ears:  External ear exam shows no significant lesions or deformities.  Otoscopic examination reveals clear canals, tympanic membranes are intact bilaterally without bulging, retraction, inflammation or discharge. Hearing is grossly normal bilaterally. Nose:  External nasal examination shows no deformity or inflammation. Nasal mucosa are pink and moist without lesions or exudates. Mouth:  Oral mucosa and oropharynx without lesions  or exudates.  Teeth in good repair. Neck:  No deformities, masses, or tenderness noted. Chest Wall:  No deformities, masses, or tenderness noted. Breasts:  No mass, nodules, thickening, tenderness, bulging, retraction, inflamation,  nipple discharge or skin changes noted.   Lungs:  Normal respiratory effort, chest expands symmetrically. Lungs are clear to auscultation, no crackles or wheezes. Heart:  Normal rate and regular rhythm. S1 and S2 normal without gallop, murmur, click, rub or other extra sounds. Abdomen:  Bowel sounds positive,abdomen soft and non-tender without masses, organomegaly or hernias noted. Msk:  No deformity or scoliosis noted of thoracic or lumbar spine.   Pulses:  R and L carotid,radial,dorsalis pedis and posterior tibial pulses are full and equal bilaterally Extremities:  No clubbing, cyanosis, edema, or deformity noted with normal full range of motion of all joints.   Neurologic:  No cranial nerve deficits noted. Station and gait are normal. . DTRs are symmetrical throughout. Sensory, motor and coordinative functions appear intact. Skin:  no rashes.   Cervical Nodes:  No lymphadenopathy noted Axillary Nodes:  No palpable lymphadenopathy Psych:  Cognition and judgment appear intact. Alert and cooperative with normal attention span and concentration. No apparent delusions, illusions, hallucinations   Impression & Recommendations:  Problem # 1:  HEALTH MAINTENANCE EXAM (ICD-V70.0) Dsicussed regular exercise.  Recommend replens for vaginal dryness since she is not intersted in an estrogen cream.  Went for labs yesterday at [facility]. I don't have the results yet.  BP looks great. Thinks her tdap is up to date. Will call wiht that info.   Problem # 2:  HYPERLIPIDEMIA (ICD-272.4)  Discussed why no longer using simvastatin 80mg  and asked her to retry the Crestor. GAve samples of 10mg  for one month and then go and have cholesterol checked in one month.  Her updated medication list for this problem includes:    Crestor 10 Mg Tabs (Rosuvastatin calcium) .Marland Kitchen... Take 1 tablet by mouth once a day at bedtime  Orders: T-Lipid Profile ([id number])  Complete Medication List: 1)  Diovan 80 Mg Tabs  (Valsartan) .... Take one tablet by mouth once a day 2)  Fluvoxamine Maleate 100 Mg Tabs (Fluvoxamine maleate) .... Take one tablet by mouth once a day 3)  Trazodone Hcl 50 Mg Tabs (Trazodone hcl) .... Take 1/2 to one  tablet by mouth daily 4)  Alprazolam 0.5 Mg Tabs (Alprazolam) .... Take one tablet by mouth once a day 5)  Crestor 10 Mg Tabs (Rosuvastatin calcium) .... Take 1 tablet by mouth once a day at bedtime 6)  Tylenol 325 Mg Tabs (Acetaminophen) .... As needed symptoms 7)  Triamterene-hctz 37.5-25 Mg Tabs (Triamterene-hctz) .... Take one tablet by mouth once a day  Patient Instructions: 1)  Start crestor one a day at bedtime.  2)  It is important that you exercise reguarly at least 20 minutes 5 times a week. If you develop chest pain, have severe difficulty breathing, or feel very tired, stop exercising immediately and seek medical attention.  3)  Take calcium +vitamin D daily.     Immunization History:  Influenza Immunization History:    Influenza:  historical (01/16/2010)

## 2010-06-02 ENCOUNTER — Telehealth: Payer: Self-pay | Admitting: Family Medicine

## 2010-06-06 ENCOUNTER — Ambulatory Visit (INDEPENDENT_AMBULATORY_CARE_PROVIDER_SITE_OTHER): Payer: 59 | Admitting: Emergency Medicine

## 2010-06-06 ENCOUNTER — Encounter: Payer: Self-pay | Admitting: Emergency Medicine

## 2010-06-06 DIAGNOSIS — H6980 Other specified disorders of Eustachian tube, unspecified ear: Secondary | ICD-10-CM

## 2010-06-07 NOTE — Progress Notes (Signed)
Summary: [facility]-REfill meds  Phone Note From Pharmacy   Caller: [facility] Summary of Call: Pt is [facility].  All rx's crossed over with refills except Trazadone and Alprazolam.  Is it OK to fill these meds or does pt need OV first?  Pt last seen for CPE on 01/27/10.  Refills should be sent to [facility]. Initial call taken by: Francee Piccolo CMA Duncan Dull),  June 02, 2010 2:39 PM  Follow-up for Phone Call        [facility] Follow-up by: Nani Gasser MD,  June 02, 2010 4:45 PM    Prescriptions: ALPRAZOLAM 0.5 MG TABS (ALPRAZOLAM) Take one tablet by mouth once a day  #30 x 1   Entered and Authorized by:   Nani Gasser MD   Signed by:   Nani Gasser MD on 06/02/2010   Method used:   Printed then faxed to ...         RxID:   [id number] TRAZODONE HCL 50 MG TABS (TRAZODONE HCL) Take 1/2 to one  tablet by mouth daily  #30 Each x 1   Entered and Authorized by:   Nani Gasser MD   Signed by:   Nani Gasser MD on 06/02/2010   Method used:   Printed then faxed to ...         RxID:   [id number]

## 2010-06-13 NOTE — Assessment & Plan Note (Signed)
Summary: EARS STUFFY/TJ rm 4   Vital Signs:  Patient Profile:   50 Years Old Female CC:      ears stopped up, sinus pain Height:     62.5 inches O2 Sat:      96 % O2 treatment:    Room Air Temp:     99.3 degrees F oral Pulse rate:   73 / minute Resp:     16 per minute BP sitting:   110 / 72  (left arm) Cuff size:   regular  Vitals Entered By: Clemens Catholic LPN (June 06, 2010 6:07 PM)                  Updated Prior Medication List: DIOVAN 80 MG TABS (VALSARTAN) Take one tablet by mouth once a day SIMVASTATIN 40 MG TABS (SIMVASTATIN) Take 1 tablet by mouth once a day TRIAMTERENE-HCTZ 37.5-25 MG TABS (TRIAMTERENE-HCTZ) Take one tablet by mouth once a day  Current Allergies (reviewed today): ! * LISINOPRIL ! LIPITOR (ATORVASTATIN CALCIUM) ! CRESTOR (ROSUVASTATIN CALCIUM) ! ASAHistory of Present Illness History from: patient Chief Complaint: ears stopped up, sinus pain History of Present Illness: 50 Years Old Female complains of onset of cold symptoms for a month.  Tina Meyer has been using no OTC meds.  Mild congestion, but ears feel stuffy as if she were in a tunnel.  No exposure to loud sounds, no new meds.  She feels that she is getting a little worse over the past 7-10 days.  She is a dental asst. No sore throat No cough No pleuritic pain No wheezing No nasal congestion No post-nasal drainage + sinus pain/pressure No chest congestion No itchy/red eyes + ears feels stuffy, as in a tunnel No hemoptysis No SOB No chills/sweats No fever No nausea No vomiting No abdominal pain No diarrhea No skin rashes No fatigue No myalgias No headache   REVIEW OF SYSTEMS Constitutional Symptoms      Denies fever, chills, night sweats, weight loss, weight gain, and fatigue.  Eyes       Denies change in vision, eye pain, eye discharge, glasses, contact lenses, and eye surgery. Ear/Nose/Throat/Mouth       Complains of ear pain and sinus problems.      Denies hearing  loss/aids, change in hearing, ear discharge, dizziness, frequent runny nose, frequent nose bleeds, sore throat, hoarseness, and tooth pain or bleeding.  Respiratory       Denies dry cough, productive cough, wheezing, shortness of breath, asthma, bronchitis, and emphysema/COPD.  Cardiovascular       Complains of murmurs.      Denies chest pain and tires easily with exhertion.    Gastrointestinal       Denies stomach pain, nausea/vomiting, diarrhea, constipation, blood in bowel movements, and indigestion.      Comments: nausea Genitourniary       Denies painful urination, kidney stones, and loss of urinary control. Neurological       Complains of headaches.      Denies paralysis, seizures, and fainting/blackouts. Musculoskeletal       Denies muscle pain, joint pain, joint stiffness, decreased range of motion, redness, swelling, muscle weakness, and gout.  Skin       Denies bruising, unusual mles/lumps or sores, and hair/skin or nail changes.  Psych       Denies mood changes, temper/anger issues, anxiety/stress, speech problems, depression, and sleep problems. Other Comments: pt c/o ears stuffy, dizzy at times, RT jaw hurts, she can feel her  E-tubes draining, and sinus pain x . she states that she was nauseated on friday. she has not tried any OTC meds, she did use OTC swimmers ear med in her ears with no relief.   Past History:  Past Surgical History: Reviewed history from 05/27/2008 and no changes required. [facility] 2004 @ [facility]  Family History: Reviewed history from 12/31/2008 and no changes required. Aunt with Breast  Cancer mother and father deceased brother alive and healthy  Social History: Reviewed history from 01/27/2010 and no changes required. 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  denies drinking denies smoking denies recreational drug use Regular exercise-yes Physical Exam General appearance: well developed, well nourished, no acute  distress Ears: normal, no lesions or deformities Nasal: mucosa pink, nonedematous, no septal deviation, turbinates normal Oral/Pharynx: tongue normal, posterior pharynx without erythema or exudate Neck: mild tenderness bilateral TMJ areas Chest/Lungs: no rales, wheezes, or rhonchi bilateral, breath sounds equal without effort Heart: regular rate and  rhythm, no murmur MSE: oriented to time, place, and person Assessment New Problems: DYSFUNCTION OF EUSTACHIAN TUBE (ICD-381.81)   Patient Education: Patient and/or caregiver instructed in the following: rest, fluids.  Plan New Medications/Changes: PREDNISONE 10 MG TABS (PREDNISONE) 20mg  two times a day for 2 days, then 10mg  two times a day for 2 days  #QS x 0, 06/06/2010, Hoyt Koch MD AMOXICILLIN 875 MG TABS (AMOXICILLIN) 1 by mouth two times a day for 7 days  #14 x 0, 06/06/2010, Hoyt Koch MD  New Orders: Est. Patient Level III [phone] Planning Comments:   I'm not sure why her ears feel stuffy since exam completely normal.  However is becoming a chronic problem and perhaps getting worse.  Possible chronic sinusitis causing her symptoms via eustachian tube compression/dysfunction, so will treat with Amox + Pred + Sudafed.  Chew gum and yawn as well.  If not improving after a week, would suggest visiting an ENT for further evaluation.     The patient and/or caregiver has been counseled thoroughly with regard to medications prescribed including dosage, schedule, interactions, rationale for use, and possible side effects and they verbalize understanding.  Diagnoses and expected course of recovery discussed and will return if not improved as expected or if the condition worsens. Patient and/or caregiver verbalized understanding.  Prescriptions: PREDNISONE 10 MG TABS (PREDNISONE) 20mg  two times a day for 2 days, then 10mg  two times a day for 2 days  #QS x 0   Entered and Authorized by:   Hoyt Koch MD   Signed by:    Hoyt Koch MD on 06/06/2010   Method used:   Print then Give to Patient   RxID:   [id number] AMOXICILLIN 875 MG TABS (AMOXICILLIN) 1 by mouth two times a day for 7 days  #14 x 0   Entered and Authorized by:   Hoyt Koch MD   Signed by:   Hoyt Koch MD on 06/06/2010   Method used:   Print then Give to Patient   RxID:   [id number]   Orders Added: 1)  Est. Patient Level III [53664]

## 2010-07-15 ENCOUNTER — Other Ambulatory Visit: Payer: Self-pay | Admitting: Family Medicine

## 2010-07-19 ENCOUNTER — Other Ambulatory Visit: Payer: Self-pay | Admitting: *Deleted

## 2010-07-19 MED ORDER — VALSARTAN 80 MG PO TABS: 80.0000 mg | ORAL_TABLET | Freq: Every day | ORAL | Status: DC

## 2010-08-05 ENCOUNTER — Other Ambulatory Visit: Payer: Self-pay | Admitting: Family Medicine

## 2010-08-29 ENCOUNTER — Other Ambulatory Visit: Payer: Self-pay | Admitting: Family Medicine

## 2010-09-24 ENCOUNTER — Other Ambulatory Visit: Payer: Self-pay | Admitting: Family Medicine

## 2010-09-30 ENCOUNTER — Other Ambulatory Visit: Payer: Self-pay | Admitting: Family Medicine

## 2010-10-04 ENCOUNTER — Other Ambulatory Visit: Payer: Self-pay | Admitting: Family Medicine

## 2010-10-10 ENCOUNTER — Other Ambulatory Visit: Payer: Self-pay | Admitting: Family Medicine

## 2010-10-19 ENCOUNTER — Other Ambulatory Visit: Payer: Self-pay | Admitting: Family Medicine

## 2010-10-19 NOTE — Telephone Encounter (Signed)
Pt needs an office visit and has been told the last two refills she needed an office visit.  Please have patient call the office to schedule an appt.  Triage nurse

## 2010-10-27 ENCOUNTER — Other Ambulatory Visit: Payer: Self-pay | Admitting: *Deleted

## 2010-10-27 MED ORDER — VALSARTAN 80 MG PO TABS: 80.0000 mg | ORAL_TABLET | Freq: Every day | ORAL | Status: DC

## 2010-11-01 ENCOUNTER — Other Ambulatory Visit: Payer: Self-pay | Admitting: Family Medicine

## 2010-11-04 ENCOUNTER — Other Ambulatory Visit: Payer: Self-pay | Admitting: Family Medicine

## 2010-11-05 ENCOUNTER — Encounter: Payer: Self-pay | Admitting: Family Medicine

## 2010-11-09 ENCOUNTER — Ambulatory Visit (INDEPENDENT_AMBULATORY_CARE_PROVIDER_SITE_OTHER): Payer: 59 | Admitting: Family Medicine

## 2010-11-09 ENCOUNTER — Encounter: Payer: Self-pay | Admitting: Family Medicine

## 2010-11-09 VITALS — BP 105/70 | HR 77 | Ht 62.5 in | Wt 148.0 lb

## 2010-11-09 DIAGNOSIS — I10 Essential (primary) hypertension: Secondary | ICD-10-CM

## 2010-11-09 DIAGNOSIS — E785 Hyperlipidemia, unspecified: Secondary | ICD-10-CM

## 2010-11-09 DIAGNOSIS — F341 Dysthymic disorder: Secondary | ICD-10-CM

## 2010-11-09 LAB — LIPID PANEL
HDL: 54 mg/dL (ref >39)
Total CHOL/HDL Ratio: 4.2 Ratio
Triglycerides: 164 mg/dL — ABNORMAL HIGH (ref <150)

## 2010-11-09 LAB — TSH: TSH: 2.515 u[IU]/mL (ref 0.350–4.500)

## 2010-11-09 MED ORDER — VALSARTAN-HYDROCHLOROTHIAZIDE 160-25 MG PO TABS: 1.0000 | ORAL_TABLET | Freq: Every day | ORAL | Status: DC

## 2010-11-09 NOTE — Assessment & Plan Note (Signed)
Given lab slip. Due to recheck levels today. Adjust medication if needed. Encourage more regular exercise.

## 2010-11-09 NOTE — Assessment & Plan Note (Addendum)
The blood pressure is well-controlled that we can back off of her medication. We'll discontinue the triamterene. We'll combine HCTZ and Diovan. Followup in 6 months. She can call sooner if she is having any low blood pressures. I encouraged regular exercise. Given coupon card for Diovan.

## 2010-11-09 NOTE — Assessment & Plan Note (Addendum)
Stable. She is happy with her current regimen. Her GAD-7 score is 7.

## 2010-11-09 NOTE — Progress Notes (Signed)
  Subjective:    Patient ID: Tina Meyer, female    DOB: 21-Sep-1960, 50 y.o.   MRN: [id number]  HPI Hyperlipidemia-she has had no problems or side effects with her medication. She says she takes it regularly. She is to recheck her lipid levels.  Anxiety-she says her mood is stable and well controlled. She is currently very happy with the results as she's had. She does want to get the trazodone  in 25 mg as she doesn't have to split them in half.  Hypertension-doing well and she is very compliant with her medications. She has checked her blood pressure several times and once it was actually in the 90s. Typically it tends to run under 120. She denies any headache chest pain, shortness of breath or dizziness. She does get some exercise and has not retained. She currently takes $50 per month for her Diovan.  Review of Systems  BP 105/70  Pulse 77  Ht 5' 2.5" (1.588 m)  Wt 148 lb (67.132 kg)  BMI 26.64 kg/m2    Allergies  Allergen Reactions  . Aspirin     REACTION: nausea  . Atorvastatin     REACTION: muscle aches.  . Lisinopril     REACTION: anaphalactic  . Rosuvastatin     REACTION: myalgias    Past Medical History  Diagnosis Date  . Hypertension   . Hyperlipidemia   . Depression     Past Surgical History  Procedure Date  . Abdominal hysterectomy     History   Social History  . Marital Status: Divorced    Spouse Name: N/A    Number of Children: N/A  . Years of Education: N/A   Occupational History  . Not on file.   Social History Main Topics  . Smoking status: Never Smoker   . Smokeless tobacco: Not on file  . Alcohol Use: No  . Drug Use: No  . Sexually Active:    Other Topics Concern  . Not on file   Social History Narrative  . No narrative on file    Family History  Problem Relation Age of Onset  . Breast cancer      aunt    Ms. Bevins had no medications administered during this visit.     Objective:   Physical Exam  Constitutional:  She is oriented to person, place, and time. She appears well-developed and well-nourished.  HENT:  Head: Normocephalic and atraumatic.  Eyes: EOM are normal.  Neck: Neck supple. No thyromegaly present.  Cardiovascular: Normal rate, regular rhythm and normal heart sounds.   Pulmonary/Chest: Effort normal and breath sounds normal.  Musculoskeletal: She exhibits no edema.  Lymphadenopathy:    She has no cervical adenopathy.  Neurological: She is alert and oriented to person, place, and time.  Skin: Skin is warm and dry.  Psychiatric: She has a normal mood and affect. Her behavior is normal.          Assessment & Plan:

## 2010-11-10 ENCOUNTER — Telehealth: Payer: Self-pay | Admitting: Family Medicine

## 2010-11-10 LAB — COMPLETE METABOLIC PANEL WITH GFR
ALT: 23 U/L (ref 0–35)
BUN: 19 mg/dL (ref 6–23)
CO2: 28 mEq/L (ref 19–32)
Creat: 1.04 mg/dL (ref 0.50–1.10)
GFR, Est African American: >60 mL/min (ref >60)
GFR, Est Non African American: 56 mL/min — ABNORMAL LOW (ref >60)
Total Bilirubin: 0.5 mg/dL (ref 0.3–1.2)

## 2010-11-10 MED ORDER — SIMVASTATIN 80 MG PO TABS: 80.0000 mg | ORAL_TABLET | Freq: Every day | ORAL | Status: DC

## 2010-11-10 NOTE — Telephone Encounter (Signed)
Pt.notified

## 2010-11-10 NOTE — Telephone Encounter (Signed)
Call patient: Complete metabolic looks good. Kidney function is stable. Cholesterol including triglycerides and LDL are high. Let's work on low-fat diet and regular exercise and recheck in 6 months. I would also like to increase her simvastatin to 80 mg. I will send her for new prescription. Thyroid looks great.

## 2010-12-14 ENCOUNTER — Other Ambulatory Visit: Payer: Self-pay | Admitting: Family Medicine

## 2010-12-15 ENCOUNTER — Other Ambulatory Visit: Payer: Self-pay | Admitting: Family Medicine

## 2010-12-19 ENCOUNTER — Encounter: Payer: Self-pay | Admitting: Emergency Medicine

## 2010-12-19 ENCOUNTER — Inpatient Hospital Stay (INDEPENDENT_AMBULATORY_CARE_PROVIDER_SITE_OTHER)
Admission: RE | Admit: 2010-12-19 | Discharge: 2010-12-19 | Disposition: A | Discharge status: Home / Self Care | Payer: 59 | Source: Ambulatory Visit | Attending: Emergency Medicine | Admitting: Emergency Medicine

## 2010-12-19 DIAGNOSIS — L2089 Other atopic dermatitis: Secondary | ICD-10-CM

## 2010-12-19 DIAGNOSIS — D690 Allergic purpura: Secondary | ICD-10-CM | POA: Insufficient documentation

## 2011-01-16 ENCOUNTER — Other Ambulatory Visit: Payer: Self-pay | Admitting: Family Medicine

## 2011-01-23 ENCOUNTER — Telehealth: Payer: Self-pay | Admitting: Family Medicine

## 2011-01-24 NOTE — Telephone Encounter (Signed)
Closed

## 2011-02-20 ENCOUNTER — Other Ambulatory Visit: Payer: Self-pay | Admitting: Family Medicine

## 2011-02-26 ENCOUNTER — Other Ambulatory Visit: Payer: Self-pay | Admitting: *Deleted

## 2011-02-26 MED ORDER — ALPRAZOLAM 0.5 MG PO TABS: 0.5000 mg | ORAL_TABLET | Freq: Every day | ORAL | Status: DC | PRN

## 2011-03-17 ENCOUNTER — Other Ambulatory Visit: Payer: Self-pay | Admitting: Family Medicine

## 2011-03-19 NOTE — Progress Notes (Signed)
Summary: ALLERIC REACTION? (room 2)   Vital Signs:  Patient Profile:   50 Years Old Female CC:      Scattered Height:     62.5 inches Weight:      147 pounds O2 Sat:      97 % O2 treatment:    Room Air Temp:     98.3 degrees F oral Pulse rate:   71 / minute Resp:     16 per minute BP sitting:   108 / 75  (left arm) Cuff size:   regular  Pt. in pain?   no  Vitals Entered By: Lavell Islam RN (December 19, 2010 7:42 PM)                   Updated Prior Medication List: DIOVAN 80 MG TABS (VALSARTAN) Take one tablet by mouth once a day SIMVASTATIN 40 MG TABS (SIMVASTATIN) Take 1 tablet by mouth once a day TRIAMTERENE-HCTZ 37.5-25 MG TABS (TRIAMTERENE-HCTZ) Take one tablet by mouth once a day  Current Allergies: ! * LISINOPRIL ! LIPITOR (ATORVASTATIN CALCIUM) ! CRESTOR (ROSUVASTATIN CALCIUM) ! ASAHistory of Present Illness Chief Complaint: Scattered History of Present Illness: Itchy scattered rash over the past few days.  Doesn't know what she got into.  Started on lower legs and progressed to L neck, chest, back.  Took 1 prednisone prior to arriving which helped.  No URI symptoms.  No new meds, soaps, shampoos, detergents, travel, tick bites.  REVIEW OF SYSTEMS Constitutional Symptoms      Denies fever, chills, night sweats, weight loss, weight gain, and fatigue.  Eyes       Denies change in vision, eye pain, eye discharge, glasses, contact lenses, and eye surgery. Ear/Nose/Throat/Mouth       Denies hearing loss/aids, change in hearing, ear pain, ear discharge, dizziness, frequent runny nose, frequent nose bleeds, sinus problems, sore throat, hoarseness, and tooth pain or bleeding.  Respiratory       Denies dry cough, productive cough, wheezing, shortness of breath, asthma, bronchitis, and emphysema/COPD.  Cardiovascular       Denies murmurs, chest pain, and tires easily with exhertion.    Gastrointestinal       Complains of diarrhea.      Denies stomach pain,  nausea/vomiting, constipation, blood in bowel movements, and indigestion.      Comments: last night Genitourniary       Denies painful urination, kidney stones, and loss of urinary control. Neurological       Denies paralysis, seizures, and fainting/blackouts. Musculoskeletal       Denies muscle pain, joint pain, joint stiffness, decreased range of motion, redness, swelling, muscle weakness, and gout.  Skin       Denies bruising, unusual mles/lumps or sores, and hair/skin or nail changes.  Psych       Denies mood changes, temper/anger issues, anxiety/stress, speech problems, depression, and sleep problems. Other Comments: scattered rash x 4 days   Past History:  Past Medical History: Reviewed history from 12/31/2008 and no changes required. high blood pressure high cholesterol depression Dr. Abbe Amsterdam - GYN.  Past Surgical History: Reviewed history from 05/27/2008 and no changes required. Hysterectomy 2004 @ [facility]  Family History: Reviewed history from 12/31/2008 and no changes required. Aunt with Breast  Cancer mother and father deceased brother alive and healthy  Social History: Reviewed history from 01/27/2010 and no changes required. [employer] at [facility]. Single.  denies drinking denies smoking denies recreational drug use  Regular exercise-yes Physical Exam General appearance: well developed, well nourished, no acute distress MSE: oriented to time, place, and person Scattered irritated eczema/excoritation rash on neck, legs.  Lower legs with a mild vasulitic rash.   Assessment New Problems: DERMATITIS, ATOPIC (ICD-691.8) ALLERGIC PURPURA (ICD-287.0)   Plan New Medications/Changes: PREDNISONE (PAK) 10 MG TABS (PREDNISONE) 6 day pack, use as directed  #1 x 0, 12/19/2010, Hoyt Koch MD  New Orders: Solumedrol up to 125mg  [J2930] Admin of Therapeutic Inj  intramuscular or subcutaneous [[id number]] Planning Comments:   Pt with seemingly  a few types of rashes.  Lower legs appear more like a small vessel vaculitis or allergic purpura but the rest seems atopic.  No new meds and no major medical issues (no SLE, etc) , so likely is an allergic or hypersensitivity cause.  Will treat with prednisone (Solumedrol given in clinic also).  She reports not being able to take antihistamines because she doesn't feel good while taking them.  If not improving or if worsening, would like her to see her PCP to consider CBC, UA, ESR, HIV, or other workup such as RMSF (but no HA or fever yet).   The patient and/or caregiver has been counseled thoroughly with regard to medications prescribed including dosage, schedule, interactions, rationale for use, and possible side effects and they verbalize understanding.  Diagnoses and expected course of recovery discussed and will return if not improved as expected or if the condition worsens. Patient and/or caregiver verbalized understanding.  Prescriptions: PREDNISONE (PAK) 10 MG TABS (PREDNISONE) 6 day pack, use as directed  #1 x 0   Entered and Authorized by:   Hoyt Koch MD   Signed by:   Hoyt Koch MD on 12/19/2010   Method used:   Print then Give to Patient   RxID:   [id number]   Medication Administration  Injection # 1:    Medication: Solumedrol up to 125mg     Diagnosis: DERMATITIS, ATOPIC (ICD-691.8)    Route: IM    Site: RUOQ gluteus    Exp Date: 08/14/2013    Lot #: [id number]    Mfr: [employer]    Comments: 125 mg given     Patient tolerated injection without complications    Given by: Clemens Catholic LPN (December 19, 2010 8:10 PM)  Orders Added: 1)  Solumedrol up to 125mg  [J2930] 2)  Admin of Therapeutic Inj  intramuscular or subcutaneous [[id number]]

## 2011-04-06 ENCOUNTER — Emergency Department
Admission: EM | Admit: 2011-04-06 | Discharge: 2011-04-06 | Disposition: A | Discharge status: Home / Self Care | Payer: 59 | Source: Home / Self Care | Attending: Emergency Medicine | Admitting: Emergency Medicine

## 2011-04-06 ENCOUNTER — Encounter: Payer: Self-pay | Admitting: *Deleted

## 2011-04-06 DIAGNOSIS — R05 Cough: Secondary | ICD-10-CM

## 2011-04-06 DIAGNOSIS — J069 Acute upper respiratory infection, unspecified: Secondary | ICD-10-CM

## 2011-04-06 DIAGNOSIS — J029 Acute pharyngitis, unspecified: Secondary | ICD-10-CM

## 2011-04-06 DIAGNOSIS — R059 Cough, unspecified: Secondary | ICD-10-CM

## 2011-04-06 LAB — POCT INFLUENZA A/B
Influenza A, POC: NEGATIVE
Influenza B, POC: NEGATIVE

## 2011-04-06 LAB — POCT RAPID STREP A (OFFICE): Rapid Strep A Screen: NEGATIVE

## 2011-04-06 MED ORDER — AZITHROMYCIN 250 MG PO TABS: ORAL_TABLET | ORAL | Status: AC

## 2011-04-06 MED ORDER — GUAIFENESIN-CODEINE 100-10 MG/5ML PO SYRP: 5.0000 mL | ORAL_SOLUTION | Freq: Four times a day (QID) | ORAL | Status: AC | PRN

## 2011-04-06 NOTE — ED Provider Notes (Signed)
History     CSN: [id number]  Arrival date & time 04/06/11  1535   First MD Initiated Contact with Patient 04/06/11 1554      Chief Complaint  Patient presents with  . Generalized Body Aches  . Chills    (Consider location/radiation/quality/duration/timing/severity/associated sxs/prior treatment) HPI Tina Meyer is a 50 y.o. female who complains of onset of cold symptoms for a few days. Her husband has non-Hodgkin's lymphoma and she is very concerned that she is to be contagious around him. She had a flu shot a few months ago. + sore throat + cough No pleuritic pain No wheezing + nasal congestion + post-nasal drainage + sinus pain/pressure + chest congestion No itchy/red eyes No earache No hemoptysis No SOB + chills/sweats No fever No nausea No vomiting No abdominal pain No diarrhea No skin rashes No fatigue No myalgias No headache    Past Medical History  Diagnosis Date  . Hypertension   . Hyperlipidemia   . Depression     Past Surgical History  Procedure Date  . Abdominal hysterectomy     Family History  Problem Relation Age of Onset  . Breast cancer      aunt    History  Substance Use Topics  . Smoking status: Never Smoker   . Smokeless tobacco: Not on file  . Alcohol Use: No    OB History    Grav Para Term Preterm Abortions TAB SAB Ect Mult Living                  Review of Systems  Allergies  Aspirin; Atorvastatin; Lisinopril; and Rosuvastatin  Home Medications   Current Outpatient Rx  Name Route Sig Dispense Refill  . ALPRAZOLAM 0.5 MG PO TABS Oral Take 1 tablet (0.5 mg total) by mouth daily as needed for sleep. 30 tablet 0  . AZITHROMYCIN 250 MG PO TABS  Use as directed 1 each 0  . FLUVOXAMINE MALEATE 100 MG PO TABS  TAKE 1 TABLET BY MOUTH DAILY 30 tablet 2  . GUAIFENESIN-CODEINE 100-10 MG/5ML PO SYRP Oral Take 5 mLs by mouth 4 (four) times daily as needed for cough or congestion. 120 mL 0  . SIMVASTATIN 80 MG PO TABS Oral Take  1 tablet (80 mg total) by mouth at bedtime. 90 tablet 1  . TRAZODONE HCL 50 MG PO TABS  TAKE 1/2 TO 1 TABLET BY MOUTH DAILY 30 tablet 1  . TRIAMTERENE-HCTZ 37.5-25 MG PO TABS  TAKE 1 TABLET BY MOUTH EVERY DAY 30 tablet 1  . VALSARTAN-HYDROCHLOROTHIAZIDE 160-25 MG PO TABS Oral Take 1 tablet by mouth daily. 90 tablet 1    BP 98/64  Pulse 87  Temp(Src) 98.4 F (36.9 C) (Oral)  Resp 16  Ht 5\' 2"  (1.575 m)  Wt 144 lb (65.318 kg)  BMI 26.34 kg/m2  SpO2 97%  Physical Exam  Nursing note and vitals reviewed. Constitutional: She is oriented to person, place, and time. She appears well-developed and well-nourished.  HENT:  Head: Normocephalic and atraumatic.  Right Ear: Tympanic membrane, external ear and ear canal normal.  Left Ear: Tympanic membrane, external ear and ear canal normal.  Nose: Mucosal edema and rhinorrhea present.  Mouth/Throat: Posterior oropharyngeal erythema present. No oropharyngeal exudate or posterior oropharyngeal edema.  Eyes: No scleral icterus.  Neck: Neck supple.  Cardiovascular: Regular rhythm and normal heart sounds.   Pulmonary/Chest: Effort normal and breath sounds normal. No respiratory distress.  Neurological: She is alert and oriented to person, place, and  time.  Skin: Skin is warm and dry.  Psychiatric: She has a normal mood and affect. Her speech is normal.    ED Course  Procedures (including critical care time)   Labs Reviewed  STREP A DNA PROBE  POCT INFLUENZA A/B  POCT RAPID STREP A (OFFICE)   No results found.   1. Acute upper respiratory infections of unspecified site   2. Acute pharyngitis   3. Cough       MDM   We tested her for strep and culture. A rapid strep test is negative. We also tested for flu which is negative. We did this because of her husbands compromised immune system. I gave her prescription for a Z-Pak as well as Cheratussin for the cough. I have advised her to make sure that her hygiene is very good and that she  wears the mask around her husband to prevent him from becoming sick.  Follow up with your primary care physician or specialist within 5-7 days if not improving, have worsening of symptoms, or develop new severe symptoms.      Lily Kocher, MD 04/06/11 1622

## 2011-04-06 NOTE — ED Notes (Signed)
Patient c/o body aches, fever/chills, HA and scratchy throat x 1 1/2 days. Had flu shot 2 months ago.

## 2011-04-07 LAB — STREP A DNA PROBE: GASP: NEGATIVE

## 2011-04-16 ENCOUNTER — Other Ambulatory Visit: Payer: Self-pay | Admitting: *Deleted

## 2011-04-16 MED ORDER — VALSARTAN-HYDROCHLOROTHIAZIDE 160-25 MG PO TABS: 1.0000 | ORAL_TABLET | Freq: Every day | ORAL | Status: DC

## 2011-04-16 MED ORDER — ALPRAZOLAM 0.5 MG PO TABS: 0.5000 mg | ORAL_TABLET | Freq: Every day | ORAL | Status: DC | PRN

## 2011-04-20 ENCOUNTER — Other Ambulatory Visit: Payer: Self-pay | Admitting: Family Medicine

## 2011-05-18 ENCOUNTER — Other Ambulatory Visit: Payer: Self-pay | Admitting: Family Medicine

## 2011-05-28 ENCOUNTER — Other Ambulatory Visit: Payer: Self-pay | Admitting: *Deleted

## 2011-05-28 MED ORDER — ALPRAZOLAM 0.5 MG PO TABS: 0.5000 mg | ORAL_TABLET | Freq: Every day | ORAL | Status: DC | PRN

## 2011-06-15 ENCOUNTER — Other Ambulatory Visit: Payer: Self-pay | Admitting: *Deleted

## 2011-06-15 MED ORDER — VALSARTAN-HYDROCHLOROTHIAZIDE 160-25 MG PO TABS: 1.0000 | ORAL_TABLET | Freq: Every day | ORAL | Status: DC

## 2011-07-23 ENCOUNTER — Other Ambulatory Visit: Payer: Self-pay | Admitting: *Deleted

## 2011-07-23 MED ORDER — ALPRAZOLAM 0.5 MG PO TABS: 0.5000 mg | ORAL_TABLET | Freq: Every day | ORAL | Status: DC | PRN

## 2011-08-02 ENCOUNTER — Other Ambulatory Visit: Payer: Self-pay | Admitting: Family Medicine

## 2011-08-03 ENCOUNTER — Encounter: Payer: Self-pay | Admitting: *Deleted

## 2011-08-07 ENCOUNTER — Encounter: Payer: Self-pay | Admitting: Family Medicine

## 2011-08-07 ENCOUNTER — Ambulatory Visit (INDEPENDENT_AMBULATORY_CARE_PROVIDER_SITE_OTHER): Payer: 59 | Admitting: Family Medicine

## 2011-08-07 VITALS — BP 90/58 | HR 65 | Ht 61.5 in | Wt 141.0 lb

## 2011-08-07 DIAGNOSIS — Z1211 Encounter for screening for malignant neoplasm of colon: Secondary | ICD-10-CM

## 2011-08-07 DIAGNOSIS — Z0000 Encounter for general adult medical examination without abnormal findings: Secondary | ICD-10-CM

## 2011-08-07 DIAGNOSIS — R011 Cardiac murmur, unspecified: Secondary | ICD-10-CM

## 2011-08-07 NOTE — Progress Notes (Signed)
  Subjective:     Tina Meyer is a 51 y.o. female and is here for a comprehensive physical exam. The patient reports no problems.  History   Social History  . Marital Status: Widowed    Spouse Name: N/A    Number of Children: 0  . Years of Education: N/A   Occupational History  . dental office     [employer]   Social History Main Topics  . Smoking status: Never Smoker   . Smokeless tobacco: Not on file  . Alcohol Use: No  . Drug Use: No  . Sexually Active:    Other Topics Concern  . Not on file   Social History Narrative   Had dogs.  Occ exercise.     Health Maintenance  Topic Date Due  . Pap Smear  07/30/1978  . Tetanus/tdap  07/30/1979  . Mammogram  07/30/2010  . Colonoscopy  07/30/2010  . Influenza Vaccine  01/15/2012    The following portions of the patient's history were reviewed and updated as appropriate: allergies, current medications, past family history, past medical history, past social history, past surgical history and problem list.  Review of Systems A comprehensive review of systems was negative.   Objective:    BP 90/58  Pulse 65  Ht 5' 1.5" (1.562 m)  Wt 141 lb (63.957 kg)  BMI 26.21 kg/m2 General appearance: alert, cooperative and appears stated age Head: Normocephalic, without obvious abnormality, atraumatic Eyes: conj clear, EOMi, PEERLA Ears: normal TM's and external ear canals both ears Nose: Nares normal. Septum midline. Mucosa normal. No drainage or sinus tenderness., no sinus tenderness Throat: lips, mucosa, and tongue normal; teeth and gums normal Neck: no adenopathy, no carotid bruit, no JVD, supple, symmetrical, trachea midline and thyroid not enlarged, symmetric, no tenderness/mass/nodules Back: symmetric, no curvature. ROM normal. No CVA tenderness. Lungs: clear to auscultation bilaterally Breasts: normal appearance, no masses or tenderness Heart: systolic murmur: holosystolic 3/6,  at lower left sternal  border Abdomen: soft, non-tender; bowel sounds normal; no masses,  no organomegaly Extremities: extremities normal, atraumatic, no cyanosis or edema Pulses: 2+ and symmetric Skin: Skin color, texture, turgor normal. No rashes or lesions Lymph nodes: Cervical, supraclavicular, and axillary nodes normal. Neurologic: Alert and oriented X 3, normal strength and tone. Normal symmetric reflexes. Normal coordination and gait    Assessment:    Healthy female exam.      Plan:     See After Visit Summary for Counseling Recommendations  Start a regular exercise program and make sure you are eating a healthy diet Try to eat 4 servings of dairy a day or take a calcium supplement (500mg  twice a day). Your vaccines are up to date.  Due fore screening labs. Given lab slip to go later. She is not fasting today She will check with employee health at her work to see for tetanus is up-to-date. If not she will likely get it there.  Refer for screening colonoscopy. She will test her mammogram this year. She does have a GYN as her Pap smear for her.

## 2011-08-07 NOTE — Patient Instructions (Signed)
Start a regular exercise program and make sure you are eating a healthy diet Try to eat 4 servings of dairy a day or take a calcium supplement (500mg twice a day). Your vaccines are up to date.   

## 2011-08-08 ENCOUNTER — Telehealth: Payer: Self-pay | Admitting: Family Medicine

## 2011-08-08 NOTE — Telephone Encounter (Signed)
We tried to call or cardiology to see if we could get copy of her old echo from 2000. They were unfortunately unable to locate it. Thus if she would like her to get an up-to-date echocardiogram. Certainly she is not having symptoms so is not pressing, thus we can get it anytime.

## 2011-08-10 NOTE — Telephone Encounter (Signed)
Called and left message with woman that answered the phone to have pt to call back

## 2011-08-13 NOTE — Telephone Encounter (Signed)
Called and pt is not at home per mother?. Will mail letter

## 2011-08-30 ENCOUNTER — Other Ambulatory Visit: Payer: Self-pay | Admitting: Family Medicine

## 2011-09-21 LAB — COMPLETE METABOLIC PANEL WITH GFR
ALT: 13 U/L (ref 0–35)
AST: 20 U/L (ref 0–37)
Albumin: 4.6 g/dL (ref 3.5–5.2)
CO2: 29 mEq/L (ref 19–32)
Calcium: 9.4 mg/dL (ref 8.4–10.5)
Chloride: 103 mEq/L (ref 96–112)
Creat: 1 mg/dL (ref 0.50–1.10)
GFR, Est African American: 75 mL/min
Potassium: 3.9 mEq/L (ref 3.5–5.3)
Total Protein: 7.2 g/dL (ref 6.0–8.3)

## 2011-09-21 LAB — LIPID PANEL
Cholesterol: 181 mg/dL (ref 0–200)
Triglycerides: 123 mg/dL (ref <150)
VLDL: 25 mg/dL (ref 0–40)

## 2011-09-21 LAB — CBC
HCT: 36.8 % (ref 36.0–46.0)
Hemoglobin: 12.6 g/dL (ref 12.0–15.0)
MCH: 27.9 pg (ref 26.0–34.0)
RBC: 4.51 MIL/uL (ref 3.87–5.11)

## 2011-09-27 ENCOUNTER — Other Ambulatory Visit: Payer: Self-pay | Admitting: Family Medicine

## 2011-10-23 ENCOUNTER — Other Ambulatory Visit: Payer: Self-pay | Admitting: Family Medicine

## 2011-11-01 ENCOUNTER — Other Ambulatory Visit: Payer: Self-pay | Admitting: Family Medicine

## 2011-11-05 ENCOUNTER — Other Ambulatory Visit: Payer: Self-pay | Admitting: *Deleted

## 2011-11-05 MED ORDER — ALPRAZOLAM 0.5 MG PO TABS: 0.5000 mg | ORAL_TABLET | Freq: Every day | ORAL | Status: DC | PRN

## 2011-11-23 ENCOUNTER — Other Ambulatory Visit: Payer: Self-pay | Admitting: Family Medicine

## 2011-11-23 NOTE — Telephone Encounter (Signed)
Under medications it states 1/4 of tablet. Last filled 03/2011. Please advise.

## 2011-11-23 NOTE — Telephone Encounter (Signed)
Ok to keep instructions the same.

## 2011-12-01 ENCOUNTER — Other Ambulatory Visit: Payer: Self-pay | Admitting: Family Medicine

## 2011-12-13 ENCOUNTER — Other Ambulatory Visit: Payer: Self-pay | Admitting: *Deleted

## 2011-12-13 MED ORDER — ALPRAZOLAM 0.5 MG PO TABS: 0.5000 mg | ORAL_TABLET | Freq: Every day | ORAL | Status: DC | PRN

## 2012-01-14 ENCOUNTER — Other Ambulatory Visit: Payer: Self-pay | Admitting: Family Medicine

## 2012-01-16 ENCOUNTER — Telehealth: Payer: Self-pay | Admitting: *Deleted

## 2012-01-16 MED ORDER — LOSARTAN POTASSIUM-HCTZ 100-25 MG PO TABS: 1.0000 | ORAL_TABLET | Freq: Every day | ORAL | Status: DC

## 2012-01-16 NOTE — Telephone Encounter (Signed)
Surgery. She needs to stop the Maxzide. We can also change her to losartan HCTZ which is fairly comparable to Diovan HCT. New prescriptions sent. Thus the only pill that she should be taking for blood pressure is the losartan HCTZ. Please have her followup in one month for blood pressure check.

## 2012-01-16 NOTE — Telephone Encounter (Signed)
Pt states on the Diovan-HCTZ her copay is still $30. She would like to know if there is something on the $4 list that will work as well. She also is asking if she should be on 2 diuretics?e advise.

## 2012-01-16 NOTE — Telephone Encounter (Signed)
Pt unable to come to phone and ask that we call back.

## 2012-01-17 NOTE — Telephone Encounter (Signed)
Pt informed

## 2012-01-25 ENCOUNTER — Other Ambulatory Visit: Payer: Self-pay | Admitting: Family Medicine

## 2012-01-26 ENCOUNTER — Other Ambulatory Visit: Payer: Self-pay | Admitting: Family Medicine

## 2012-02-08 ENCOUNTER — Other Ambulatory Visit: Payer: Self-pay | Admitting: Family Medicine

## 2012-02-12 ENCOUNTER — Other Ambulatory Visit: Payer: Self-pay | Admitting: Family Medicine

## 2012-02-18 ENCOUNTER — Other Ambulatory Visit: Payer: Self-pay | Admitting: Family Medicine

## 2012-02-19 NOTE — Telephone Encounter (Signed)
Is this ok to refill?  

## 2012-02-22 ENCOUNTER — Ambulatory Visit (INDEPENDENT_AMBULATORY_CARE_PROVIDER_SITE_OTHER): Payer: 59 | Admitting: Family Medicine

## 2012-02-22 ENCOUNTER — Encounter: Payer: Self-pay | Admitting: Family Medicine

## 2012-02-22 VITALS — BP 97/55 | HR 88 | Temp 97.7°F | Ht 62.0 in | Wt 143.0 lb

## 2012-02-22 DIAGNOSIS — R011 Cardiac murmur, unspecified: Secondary | ICD-10-CM

## 2012-02-22 DIAGNOSIS — E785 Hyperlipidemia, unspecified: Secondary | ICD-10-CM

## 2012-02-22 DIAGNOSIS — I10 Essential (primary) hypertension: Secondary | ICD-10-CM

## 2012-02-22 DIAGNOSIS — J329 Chronic sinusitis, unspecified: Secondary | ICD-10-CM

## 2012-02-22 LAB — COMPLETE METABOLIC PANEL WITH GFR
ALT: 12 U/L (ref 0–35)
AST: 19 U/L (ref 0–37)
Alkaline Phosphatase: 67 U/L (ref 39–117)
BUN: 14 mg/dL (ref 6–23)
Creat: 0.93 mg/dL (ref 0.50–1.10)
Total Bilirubin: 0.4 mg/dL (ref 0.3–1.2)

## 2012-02-22 LAB — LIPID PANEL
Cholesterol: 166 mg/dL (ref 0–200)
HDL: 49 mg/dL (ref >39)
Total CHOL/HDL Ratio: 3.4 Ratio
Triglycerides: 129 mg/dL (ref <150)
VLDL: 26 mg/dL (ref 0–40)

## 2012-02-22 MED ORDER — LOSARTAN POTASSIUM-HCTZ 50-12.5 MG PO TABS: 1.0000 | ORAL_TABLET | Freq: Every day | ORAL | Status: DC

## 2012-02-22 MED ORDER — AMOXICILLIN 875 MG PO TABS: 875.0000 mg | ORAL_TABLET | Freq: Two times a day (BID) | ORAL | Status: DC

## 2012-02-22 NOTE — Progress Notes (Signed)
  Subjective:    Patient ID: Tina Meyer, female    DOB: 1960/11/27, 51 y.o.   MRN: [id number]  HPI 5 days of watery itching eyes and then aches and pain started about 2 days ago. Now has green sputum from nose and chest.  + HA.  No fever.  Taking mucinex.  Had low grade fever the last 2 days. No other allergy sxs.    HTN - no CP or SOB. Taking meds regulalry. No dizziness. No swelling  Hyperlipidemia - Taking zocor regularly.  NO SE.    Review of Systems     Objective:   Physical Exam  Constitutional: She is oriented to person, place, and time. She appears well-developed and well-nourished.  HENT:  Head: Normocephalic and atraumatic.  Right Ear: External ear normal.  Left Ear: External ear normal.  Nose: Nose normal.  Mouth/Throat: Oropharynx is clear and moist.       TMs and canals are clear.   Eyes: Conjunctivae normal and EOM are normal. Pupils are equal, round, and reactive to light.  Neck: Neck supple. No thyromegaly present.       Small anterior cervical lymph node.   Cardiovascular: Normal rate, regular rhythm and normal heart sounds.        Harsh systolic murmur best heard at the left sternal border   Pulmonary/Chest: Effort normal and breath sounds normal. She has no wheezes.  Lymphadenopathy:    She has no cervical adenopathy.  Neurological: She is alert and oriented to person, place, and time.  Skin: Skin is warm and dry.  Psychiatric: She has a normal mood and affect.          Assessment & Plan:  HTN - Well controlled. Will dec her medication to 50/12.5mg . Call if blood pressure becomes elevated. Otherwise followup in 6 months.  Sinusitis  - acute sinusitis. Because she has developed fever the last couple days and let go ahead and treat her for bacterial sinusitis. Will place on high-dose amoxicillin twice a day for 10 days. If not significantly better in one week please call the office back. Can continue over-the-counter symptomatic  care.  Hyperlipidemia-she was almost at goal in June. Recheck today. She says she has made some changes to her diet. If her LDL is not under 100 then consider changing her to Livalo for better control. She had side effects with Lipitor in the past. Lab Results  Component Value Date   CHOL 181 09/21/2011   HDL 49 09/21/2011   LDLCALC 409* 09/21/2011   TRIG 123 09/21/2011   CHOLHDL 3.7 09/21/2011   Heart murmur-she says her last echocardiogram was over 10 years ago. I would like to schedule repeat echocardiogram just to further evaluate any changes in her bowels.

## 2012-02-22 NOTE — Patient Instructions (Signed)

## 2012-03-08 ENCOUNTER — Other Ambulatory Visit: Payer: Self-pay | Admitting: Family Medicine

## 2012-03-10 ENCOUNTER — Other Ambulatory Visit: Payer: Self-pay | Admitting: *Deleted

## 2012-03-25 ENCOUNTER — Ambulatory Visit (INDEPENDENT_AMBULATORY_CARE_PROVIDER_SITE_OTHER): Payer: 59 | Admitting: Sports Medicine

## 2012-03-25 ENCOUNTER — Encounter: Payer: Self-pay | Admitting: Sports Medicine

## 2012-03-25 VITALS — BP 113/76 | HR 98 | Temp 98.1°F | Wt 146.0 lb

## 2012-03-25 DIAGNOSIS — J040 Acute laryngitis: Secondary | ICD-10-CM

## 2012-03-25 MED ORDER — GUAIFENESIN-CODEINE 200-20 MG/5ML PO LIQD: 5.0000 mL | Freq: Three times a day (TID) | ORAL | Status: DC | PRN

## 2012-03-25 MED ORDER — MELOXICAM 15 MG PO TABS: ORAL_TABLET | ORAL | Status: DC

## 2012-03-25 NOTE — Patient Instructions (Addendum)
Laryngitis At the top of your windpipe is your voice box. It is the source of your voice. Inside your voice box are 2 bands of muscles called vocal cords. When you breathe, your vocal cords are relaxed and open so that air can get into the lungs. When you decide to say something, these cords come together and vibrate. The sound from these vibrations goes into your throat and comes out through your mouth as sound. Laryngitis is an inflammation of the vocal cords that causes hoarseness, cough, loss of voice, sore throat, and dry throat. Laryngitis can be temporary (acute) or long-term (chronic). Most cases of acute laryngitis improve with time.Chronic laryngitis lasts for more than 3 weeks. CAUSES Laryngitis can often be related to excessive smoking, talking, or yelling, as well as inhalation of toxic fumes and allergies. Acute laryngitis is usually caused by a viral infection, vocal strain, measles or mumps, or bacterial infections. Chronic laryngitis is usually caused by vocal cord strain, vocal cord injury, postnasal drip, growths on the vocal cords, or acid reflux. SYMPTOMS   Cough.  Sore throat.  Dry throat. RISK FACTORS  Respiratory infections.  Exposure to irritating substances, such as cigarette smoke, excessive amounts of alcohol, stomach acids, and workplace chemicals.  Voice trauma, such as vocal cord injury from shouting or speaking too loud. DIAGNOSIS  Your cargiver will perform a physical exam. During the physical exam, your caregiver will examine your throat. The most common sign of laryngitis is hoarseness. Laryngoscopy may be necessary to confirm the diagnosis of this condition. This procedure allows your caregiver to look into the larynx. HOME CARE INSTRUCTIONS  Drink enough fluids to keep your urine clear or pale yellow.  Rest until you no longer have symptoms or as directed by your caregiver.  Breathe in moist air.  Take all medicine as directed by your  caregiver.  Do not smoke.  Talk as little as possible (this includes whispering).  Write on paper instead of talking until your voice is back to normal.  Follow up with your caregiver if your condition has not improved after 10 days. SEEK MEDICAL CARE IF:   You have trouble breathing.  You cough up blood.  You have persistent fever.  You have increasing pain.  You have difficulty swallowing. MAKE SURE YOU:  Understand these instructions.  Will watch your condition.  Will get help right away if you are not doing well or get worse. Document Released: 04/02/2005 Document Revised: 06/25/2011 Document Reviewed: 06/08/2010 Exit[employer] Patient Information 2013 [employer].  

## 2012-03-25 NOTE — Progress Notes (Signed)
Subjective:    CC: Sick  HPI: Tina Meyer comes in with about a week and a half of soreness in her throat, as well as voice hoarseness. She initially had a sinusitis and was treated with amoxicillin. Unfortunately she got a little bit better, but then got worse. She denies any sinus pressure, or pain, her worst symptom is throat soreness around her larynx, as well as hoarseness. She denies any headache, shortness of breath, has a mild cough that is nonproductive, no runny nose, no HEe eyes.  Past medical history, Surgical history, Family history, Social history, Allergies, and medications have been entered into the medical record, reviewed, and no changes needed.   Review of Systems: No fevers, chills, night sweats, weight loss, chest pain, or shortness of breath.   Objective:    General: Well Developed, well nourished, and in no acute distress.  Neuro: Alert and oriented x3, extra-ocular muscles intact.  HEENT: Normocephalic, atraumatic, pupils equal round reactive to light, neck supple, no masses, shotty lymphadenopathy, thyroid nonpalpable. Her legs is mildly tender to palpation. Skin: Warm and dry, no rashes. Cardiac: Regular rate and rhythm, no murmurs rubs or gallops.  Respiratory: Clear to auscultation bilaterally. Not using accessory muscles, speaking in full sentences.  Impression and Recommendations:

## 2012-03-25 NOTE — Assessment & Plan Note (Signed)
Guaifenesin/codeine syrup. Mobic. Vocal rest. Return to clinic as needed.

## 2012-04-19 ENCOUNTER — Other Ambulatory Visit: Payer: Self-pay | Admitting: Family Medicine

## 2012-05-07 ENCOUNTER — Other Ambulatory Visit: Payer: Self-pay | Admitting: Family Medicine

## 2012-05-21 ENCOUNTER — Other Ambulatory Visit: Payer: Self-pay | Admitting: Family Medicine

## 2012-05-22 ENCOUNTER — Other Ambulatory Visit: Payer: Self-pay | Admitting: Family Medicine

## 2012-07-04 ENCOUNTER — Other Ambulatory Visit: Payer: Self-pay | Admitting: Family Medicine

## 2012-08-15 ENCOUNTER — Other Ambulatory Visit: Payer: Self-pay | Admitting: Family Medicine

## 2012-08-17 ENCOUNTER — Other Ambulatory Visit: Payer: Self-pay | Admitting: Family Medicine

## 2012-08-29 ENCOUNTER — Other Ambulatory Visit: Payer: Self-pay | Admitting: Family Medicine

## 2012-09-26 ENCOUNTER — Other Ambulatory Visit: Payer: Self-pay | Admitting: Family Medicine

## 2012-10-02 ENCOUNTER — Other Ambulatory Visit: Payer: Self-pay | Admitting: Family Medicine

## 2012-10-03 ENCOUNTER — Other Ambulatory Visit: Payer: Self-pay | Admitting: Family Medicine

## 2012-10-10 ENCOUNTER — Ambulatory Visit (INDEPENDENT_AMBULATORY_CARE_PROVIDER_SITE_OTHER): Payer: 59 | Admitting: Family Medicine

## 2012-10-10 ENCOUNTER — Encounter: Payer: Self-pay | Admitting: Family Medicine

## 2012-10-10 ENCOUNTER — Other Ambulatory Visit: Payer: Self-pay | Admitting: Family Medicine

## 2012-10-10 VITALS — BP 95/54 | HR 74 | Ht 62.0 in | Wt 142.0 lb

## 2012-10-10 DIAGNOSIS — Z23 Encounter for immunization: Secondary | ICD-10-CM

## 2012-10-10 DIAGNOSIS — Z1231 Encounter for screening mammogram for malignant neoplasm of breast: Secondary | ICD-10-CM

## 2012-10-10 DIAGNOSIS — Z1211 Encounter for screening for malignant neoplasm of colon: Secondary | ICD-10-CM

## 2012-10-10 DIAGNOSIS — I10 Essential (primary) hypertension: Secondary | ICD-10-CM

## 2012-10-10 DIAGNOSIS — L659 Nonscarring hair loss, unspecified: Secondary | ICD-10-CM

## 2012-10-10 LAB — COMPLETE METABOLIC PANEL WITH GFR
ALT: 15 U/L (ref 0–35)
Albumin: 4.4 g/dL (ref 3.5–5.2)
CO2: 30 mEq/L (ref 19–32)
Calcium: 9.5 mg/dL (ref 8.4–10.5)
Chloride: 105 mEq/L (ref 96–112)
GFR, Est African American: 74 mL/min
Sodium: 143 mEq/L (ref 135–145)
Total Protein: 7 g/dL (ref 6.0–8.3)

## 2012-10-10 LAB — FERRITIN: Ferritin: 89 ng/mL (ref 10–291)

## 2012-10-10 LAB — T3, FREE: T3, Free: 2.9 pg/mL (ref 2.3–4.2)

## 2012-10-10 LAB — CBC WITH DIFFERENTIAL/PLATELET
Eosinophils Relative: 3 % (ref 0–5)
Lymphocytes Relative: 30 % (ref 12–46)
Lymphs Abs: 2.1 10*3/uL (ref 0.7–4.0)
MCV: 80.2 fL (ref 78.0–100.0)
Platelets: 289 10*3/uL (ref 150–400)
RBC: 4.86 MIL/uL (ref 3.87–5.11)
WBC: 6.9 10*3/uL (ref 4.0–10.5)

## 2012-10-10 MED ORDER — VALSARTAN 80 MG PO TABS: 80.0000 mg | ORAL_TABLET | Freq: Every day | ORAL | Status: DC

## 2012-10-10 MED ORDER — FLUVOXAMINE MALEATE 100 MG PO TABS: ORAL_TABLET | ORAL | Status: DC

## 2012-10-10 NOTE — Progress Notes (Signed)
  Subjective:    Patient ID: Tina Meyer, female    DOB: 05-10-1960, 52 y.o.   MRN: [id number]  HPI  Hypertension followup-she's been feeling really fatigued on losartan HCTZ. She never picked up the 50/12.5 and is halving the 100/25 mg tablet. She wants to go back to her old Diovan and she think she felt better on it. She has been trying to work on her weight.  Hair loss-she's noticed hair loss for the last 4-5 months. She says it started after switching to the losartan. She then went to a local dermatologist. He felt like it was not any type of scalp disorder but felt like it could be either her thyroid or possibly from her medications. She did go [employer] and saw the losartan can cause hair loss. She is interested in switching back to her Diovan. She denies any other major changes like joint pain swelling or fevers. She has had borderline thyroid levels in the past but no true hyper-hypothyroidism.  Review of Systems Has noticed hair loss as well.  No recent skin or hair changes.     Objective:   Physical Exam  Constitutional: She is oriented to person, place, and time. She appears well-developed and well-nourished.  HENT:  Head: Normocephalic and atraumatic.  Eyes: Conjunctivae are normal. Pupils are equal, round, and reactive to light.  Neck: Neck supple. No thyromegaly present.  Cardiovascular: Normal rate, regular rhythm and normal heart sounds.   Pulmonary/Chest: Effort normal and breath sounds normal.  Lymphadenopathy:    She has no cervical adenopathy.  Neurological: She is alert and oriented to person, place, and time.  Skin: Skin is warm and dry.  Psychiatric: She has a normal mood and affect. Her behavior is normal.          Assessment & Plan:  HTN - BP is acutally low today. We will switch her back to Diovan without the HCTZ. Restart Diovan 80 mg. Followup in 2 months to make sure that she's tolerating it well and that her blood pressures coming back up and  that she's feeling better. Hopefully parallel to improve as well. Check CMP today.  Hair loss - this could be coming from the losartan HCTZ. Though this is still a potential side effect of the Diovan but she was not noticing it on the Diovan. We will switch her back. Call if there's any palms with cost. We'll also check thyroid levels, check for anemia, and check a CMP.  Declined tetanus vaccine today.  Will schedule for colonoscopy. Referral made.  Will schedule for mammogram. Referral made. She prefers the breast clinic in [place].

## 2012-10-10 NOTE — Addendum Note (Signed)
Addended by: Deno Etienne on: 10/10/2012 09:31 AM   Modules accepted: Orders, SmartSet

## 2012-10-10 NOTE — Telephone Encounter (Signed)
Last filled per med list 2012

## 2012-10-10 NOTE — Progress Notes (Signed)
Quick Note:  All labs are normal. ______ 

## 2012-10-19 ENCOUNTER — Encounter: Payer: Self-pay | Admitting: Emergency Medicine

## 2012-10-19 ENCOUNTER — Emergency Department
Admission: EM | Admit: 2012-10-19 | Discharge: 2012-10-19 | Disposition: A | Discharge status: Home / Self Care | Payer: 59 | Source: Home / Self Care | Attending: Family Medicine | Admitting: Family Medicine

## 2012-10-19 DIAGNOSIS — J069 Acute upper respiratory infection, unspecified: Secondary | ICD-10-CM

## 2012-10-19 DIAGNOSIS — M940 Chondrocostal junction syndrome [Tietze]: Secondary | ICD-10-CM

## 2012-10-19 DIAGNOSIS — J029 Acute pharyngitis, unspecified: Secondary | ICD-10-CM

## 2012-10-19 MED ORDER — AZITHROMYCIN 250 MG PO TABS: ORAL_TABLET | ORAL | Status: DC

## 2012-10-19 MED ORDER — BENZONATATE 200 MG PO CAPS: 200.0000 mg | ORAL_CAPSULE | Freq: Every day | ORAL | Status: DC

## 2012-10-19 NOTE — ED Notes (Signed)
Patient reports congestion, headache, cough, hoarseness and sore throat, with general body aches and greenish sputum x 5 days. Lives in house with person recently diagnosed with bronchitis. No OTCs this a.m.

## 2012-10-19 NOTE — ED Provider Notes (Signed)
History    CSN: [id number] Arrival date & time 10/19/12  1133  First MD Initiated Contact with Patient 10/19/12 1239     Chief Complaint  Patient presents with  . Nasal Congestion  . Cough  . Headache  . Hoarse      HPI Comments: Patient reports congestion, headache, cough, hoarseness and sore throat, with general body aches and greenish sputum x 5 days. Lives in house with person recently diagnosed with bronchitis. No OTCs this a.m.  The history is provided by the patient.   Past Medical History  Diagnosis Date  . Hypertension   . Hyperlipidemia   . Depression    Past Surgical History  Procedure Laterality Date  . Abdominal hysterectomy     Family History  Problem Relation Age of Onset  . Breast cancer Maternal Aunt   . Heart disease Maternal Aunt    History  Substance Use Topics  . Smoking status: Never Smoker   . Smokeless tobacco: Not on file  . Alcohol Use: No   OB History   Grav Para Term Preterm Abortions TAB SAB Ect Mult Living                 Review of Systems + sore throat + cough No pleuritic pain No wheezing + nasal congestion + post-nasal drainage No sinus pain/pressure No itchy/red eyes ? earache No hemoptysis No SOB No fever, + chills + nausea No vomiting No abdominal pain No diarrhea No urinary symptoms No skin rashes + fatigue + myalgias + headache Used OTC meds without relief  Allergies  Aspirin; Atorvastatin; Lisinopril; and Rosuvastatin  Home Medications   Current Outpatient Rx  Name  Route  Sig  Dispense  Refill  . ALPRAZolam (XANAX) 0.5 MG tablet      TAKE 1 TABLET BY MOUTH AT BEDTIME   30 tablet   0   . azithromycin (ZITHROMAX Z-PAK) 250 MG tablet      Take 2 tabs today; then begin one tab once daily for 4 more days. (Rx void after 10/27/12)   6 each   0   . benzonatate (TESSALON) 200 MG capsule   Oral   Take 1 capsule (200 mg total) by mouth at bedtime.   12 capsule   0   . fluvoxaMINE (LUVOX) 100 MG  tablet      TAKE 1 TABLET BY MOUTH DAILY   30 tablet   0   . simvastatin (ZOCOR) 80 MG tablet      TAKE 1 TABLET BY MOUTH AT BEDTIME.   90 tablet   1   . traZODone (DESYREL) 50 MG tablet      TAKE 1/2 TO 1 TABLET BY MOUTH DAILY   30 tablet   2   . valsartan (DIOVAN) 80 MG tablet   Oral   Take 1 tablet (80 mg total) by mouth daily.   30 tablet   3   . VIVELLE-DOT 0.05 MG/24HR                BP 107/72  Pulse 100  Temp(Src) 99.8 F (37.7 C) (Oral)  Resp 16  Ht 5\' 2"  (1.575 m)  Wt 143 lb (64.864 kg)  BMI 26.15 kg/m2  SpO2 96% Physical Exam Nursing notes and Vital Signs reviewed. Appearance:  Patient appears healthy, stated age, and in no acute distress Eyes:  Pupils are equal, round, and reactive to light and accomodation.  Extraocular movement is intact.  Conjunctivae are not inflamed  Ears:  Canals normal.  Tympanic membranes normal.  Nose:  Mildly congested turbinates.  No sinus tenderness.  Pharynx:  Normal Neck:  Supple.  Slightly tender anterior, and more tender shotty posterior nodes are palpated bilaterally  Lungs:  Clear to auscultation.  Breath sounds are equal.  Chest:  Distinct tenderness to palpation over the mid-sternum.  Heart:  Regular rate and rhythm without murmurs, rubs, or gallops.  Abdomen:  Nontender without masses or hepatosplenomegaly.  Bowel sounds are present.  No CVA or flank tenderness.  Extremities:  No edema.  No calf tenderness Skin:  No rash present.   ED Course  Procedures  None   Labs Reviewed  POCT RAPID STREP A (OFFICE) - Normal    1. Sore throat   2. Acute upper respiratory infections of unspecified site   3. Costochondritis     MDM  There is no evidence of bacterial infection today.   Prescription written for Benzonatate [facility]) to take at bedtime for night-time cough.  Take plain Robitussin (guaifenesin) for cough and congestion.  Increase fluid intake, rest. May use Afrin nasal spray (or generic  oxymetazoline) twice daily for about 5 days.  Also recommend using saline nasal spray several times daily and saline nasal irrigation (AYR is a common brand) Stop all antihistamines for now, and other non-prescription cough/cold preparations. May take Ibuprofen 200mg , 4 tabs every 8 hours with food for chest/sternum discomfort. Begin Azithromycin if not improving about one week or if persistent fever develops (Given a prescription to hold, with an expiration date)  Follow-up with family doctor if not improving about10 days.  Lattie Haw, MD 10/23/12 1056

## 2012-10-23 ENCOUNTER — Telehealth: Payer: Self-pay | Admitting: *Deleted

## 2012-10-23 NOTE — Telephone Encounter (Signed)
Pt calls and states you switched her BP med to Diovan from Losartan/HCTZ and she is having swelling in her legs and wants to know if you will give her a fluid pill back

## 2012-10-24 MED ORDER — LOSARTAN POTASSIUM-HCTZ 50-12.5 MG PO TABS: 1.0000 | ORAL_TABLET | Freq: Every day | ORAL | Status: DC

## 2012-10-24 MED ORDER — VALSARTAN-HYDROCHLOROTHIAZIDE 80-12.5 MG PO TABS: 1.0000 | ORAL_TABLET | Freq: Every day | ORAL | Status: DC

## 2012-10-24 NOTE — Telephone Encounter (Signed)
Changed prescription to Diovan HCT which has a diuretic. Please let me know she's having any problems after she starts the medication.

## 2012-10-24 NOTE — Telephone Encounter (Signed)
Ok med changed back to losartan hct.

## 2012-10-24 NOTE — Telephone Encounter (Signed)
Patient advised.

## 2012-10-24 NOTE — Telephone Encounter (Signed)
LM to call office back. Barry Dienes, LPN

## 2012-10-24 NOTE — Telephone Encounter (Signed)
Beautifull states the Diovan is not working. Her legs are swelling and she has headaches. She switched herself back to Losartan/HCTV.

## 2012-11-03 ENCOUNTER — Other Ambulatory Visit: Payer: Self-pay | Admitting: Sports Medicine

## 2012-11-04 ENCOUNTER — Ambulatory Visit (INDEPENDENT_AMBULATORY_CARE_PROVIDER_SITE_OTHER): Payer: Commercial Indemnity

## 2012-11-04 ENCOUNTER — Encounter: Payer: Self-pay | Admitting: Family Medicine

## 2012-11-04 ENCOUNTER — Ambulatory Visit (INDEPENDENT_AMBULATORY_CARE_PROVIDER_SITE_OTHER): Payer: Commercial Indemnity | Admitting: Family Medicine

## 2012-11-04 VITALS — BP 106/66 | HR 81 | Ht 62.0 in | Wt 141.0 lb

## 2012-11-04 DIAGNOSIS — I10 Essential (primary) hypertension: Secondary | ICD-10-CM

## 2012-11-04 DIAGNOSIS — Z1231 Encounter for screening mammogram for malignant neoplasm of breast: Secondary | ICD-10-CM

## 2012-11-04 MED ORDER — ALPRAZOLAM 0.5 MG PO TABS: ORAL_TABLET | ORAL | Status: DC

## 2012-11-04 MED ORDER — OLMESARTAN MEDOXOMIL-HCTZ 20-12.5 MG PO TABS: 1.0000 | ORAL_TABLET | Freq: Every day | ORAL | Status: DC

## 2012-11-04 NOTE — Progress Notes (Signed)
  Subjective:    Patient ID: Tina Meyer, female    DOB: 1961-02-26, 52 y.o.   MRN: [id number]  HPI We changed her to Diovan (no HCT) and BP went up. She started getting HA and head pressure and felt more swollen.  She went back to her losartan HCT and felt much better.     Review of Systems     Objective:   Physical Exam  Constitutional: She is oriented to person, place, and time. She appears well-developed and well-nourished.  HENT:  Head: Normocephalic and atraumatic.  Cardiovascular: Normal rate, regular rhythm and normal heart sounds.   Pulmonary/Chest: Effort normal and breath sounds normal.  Neurological: She is alert and oriented to person, place, and time.  Skin: Skin is warm and dry.  Psychiatric: She has a normal mood and affect. Her behavior is normal.          Assessment & Plan:  HTN- well controlled. But Will change to benicar HCT since she is losing hair on the losartan. If not feeling well on it then change to Diovan HCT.

## 2012-11-13 ENCOUNTER — Telehealth: Payer: Self-pay | Admitting: *Deleted

## 2012-11-13 MED ORDER — OLMESARTAN MEDOXOMIL-HCTZ 20-12.5 MG PO TABS: 1.0000 | ORAL_TABLET | Freq: Every day | ORAL | Status: DC

## 2012-11-13 NOTE — Telephone Encounter (Signed)
LMOM notifying pt of rx. 

## 2012-11-13 NOTE — Telephone Encounter (Signed)
Rx sent.  Make sure to use coupon card.

## 2012-11-13 NOTE — Telephone Encounter (Signed)
Pt called & states that the benicar samples you gave her seem to be working well & her home bp's have been great.  She would like rx for it.  Just wanted to run it by you

## 2012-11-21 ENCOUNTER — Other Ambulatory Visit: Payer: Self-pay | Admitting: *Deleted

## 2012-11-21 ENCOUNTER — Other Ambulatory Visit: Payer: Self-pay | Admitting: Family Medicine

## 2012-11-21 MED ORDER — OLMESARTAN MEDOXOMIL-HCTZ 20-12.5 MG PO TABS: 1.0000 | ORAL_TABLET | Freq: Every day | ORAL | Status: DC

## 2012-12-08 ENCOUNTER — Encounter: Payer: 59 | Admitting: Family Medicine

## 2012-12-10 ENCOUNTER — Other Ambulatory Visit: Payer: Self-pay | Admitting: Family Medicine

## 2012-12-11 ENCOUNTER — Other Ambulatory Visit: Payer: Self-pay | Admitting: Family Medicine

## 2012-12-11 NOTE — Telephone Encounter (Signed)
Looks like this was discontinued

## 2012-12-12 ENCOUNTER — Encounter: Payer: Self-pay | Admitting: Family Medicine

## 2012-12-12 ENCOUNTER — Ambulatory Visit (INDEPENDENT_AMBULATORY_CARE_PROVIDER_SITE_OTHER): Payer: Commercial Indemnity | Admitting: Family Medicine

## 2012-12-12 VITALS — BP 90/58 | HR 88 | Wt 144.0 lb

## 2012-12-12 DIAGNOSIS — I10 Essential (primary) hypertension: Secondary | ICD-10-CM

## 2012-12-12 DIAGNOSIS — Z1211 Encounter for screening for malignant neoplasm of colon: Secondary | ICD-10-CM

## 2012-12-12 MED ORDER — HYDROCHLOROTHIAZIDE 25 MG PO TABS: 25.0000 mg | ORAL_TABLET | Freq: Every day | ORAL | Status: DC

## 2012-12-12 NOTE — Progress Notes (Signed)
  Subjective:    Patient ID: Tina Meyer, female    DOB: [birth year], 52 y.o.   MRN: [id number]  HPI HTN-  Pt denies chest pain, SOB, dizziness, or heart palpitations.  Taking meds as directed w/o problems.  Denies medication side effects. She denies any lightheadedness. No headaches. She says she's taking her medications regularly.  She is not quite ready to have colon cancer screening. She says she wants to hold off until next year. She is willing to do stool cards. Next  She did have her mammogram in July and we did get that report.  She has had a hysterectomy so does not need another Pap smear at this point in time.  Review of Systems     Objective:   Physical Exam  Constitutional: She is oriented to person, place, and time. She appears well-developed and well-nourished.  HENT:  Head: Normocephalic and atraumatic.  Cardiovascular: Normal rate, regular rhythm and normal heart sounds.   Pulmonary/Chest: Effort normal and breath sounds normal.  Neurological: She is alert and oriented to person, place, and time.  Skin: Skin is warm and dry.  Psychiatric: She has a normal mood and affect. Her behavior is normal.          Assessment & Plan:  HTN - well controlled. Bp actually low today.  Will stop benicar and inc HCTZ to 25mg . F/u in 8 weeks.   colonoscopy needed.  Discussed. She declines today. She is willing to do stool cards.   Will flu shot through work.

## 2012-12-13 ENCOUNTER — Other Ambulatory Visit: Payer: Self-pay | Admitting: Family Medicine

## 2013-01-13 ENCOUNTER — Other Ambulatory Visit: Payer: Self-pay | Admitting: Family Medicine

## 2013-01-14 ENCOUNTER — Other Ambulatory Visit: Payer: Self-pay | Admitting: Family Medicine

## 2013-02-11 ENCOUNTER — Telehealth: Payer: Self-pay | Admitting: *Deleted

## 2013-02-11 MED ORDER — OLMESARTAN MEDOXOMIL-HCTZ 20-12.5 MG PO TABS: 1.0000 | ORAL_TABLET | Freq: Every day | ORAL | Status: DC

## 2013-02-11 NOTE — Telephone Encounter (Signed)
Okay. No problem. I will send a new prescription for the Benicar HCT.

## 2013-02-11 NOTE — Telephone Encounter (Signed)
LMOM with response.  Ceejay Kegley, LPN  

## 2013-02-11 NOTE — Telephone Encounter (Signed)
Pt LM saying she didn't feel right on the HCTZ and that her BP is up and she has a HA. She is asking if she can go back on the Benicar and states she feels comfortable taking it. Tried to call pt back to getting what her BP readings are but got no answer. Meyer Cory, LPN

## 2013-02-13 ENCOUNTER — Other Ambulatory Visit: Payer: Self-pay | Admitting: Family Medicine

## 2013-02-19 ENCOUNTER — Other Ambulatory Visit: Payer: Self-pay | Admitting: Family Medicine

## 2013-02-27 ENCOUNTER — Other Ambulatory Visit: Payer: Self-pay | Admitting: Family Medicine

## 2013-03-11 ENCOUNTER — Other Ambulatory Visit: Payer: Self-pay | Admitting: Family Medicine

## 2013-03-12 ENCOUNTER — Other Ambulatory Visit: Payer: Self-pay | Admitting: Family Medicine

## 2013-03-22 ENCOUNTER — Other Ambulatory Visit: Payer: Self-pay | Admitting: Family Medicine

## 2013-03-26 ENCOUNTER — Other Ambulatory Visit: Payer: Self-pay | Admitting: Family Medicine

## 2013-03-29 ENCOUNTER — Other Ambulatory Visit: Payer: Self-pay | Admitting: Family Medicine

## 2013-04-14 ENCOUNTER — Ambulatory Visit (INDEPENDENT_AMBULATORY_CARE_PROVIDER_SITE_OTHER): Payer: Commercial Indemnity | Admitting: Family Medicine

## 2013-04-14 ENCOUNTER — Encounter: Payer: Self-pay | Admitting: Family Medicine

## 2013-04-14 VITALS — BP 112/65 | HR 72 | Temp 98.1°F | Ht 63.0 in | Wt 151.0 lb

## 2013-04-14 DIAGNOSIS — E559 Vitamin D deficiency, unspecified: Secondary | ICD-10-CM

## 2013-04-14 DIAGNOSIS — R011 Cardiac murmur, unspecified: Secondary | ICD-10-CM

## 2013-04-14 DIAGNOSIS — Z8639 Personal history of other endocrine, nutritional and metabolic disease: Secondary | ICD-10-CM

## 2013-04-14 DIAGNOSIS — Z0000 Encounter for general adult medical examination without abnormal findings: Secondary | ICD-10-CM

## 2013-04-14 DIAGNOSIS — Z862 Personal history of diseases of the blood and blood-forming organs and certain disorders involving the immune mechanism: Secondary | ICD-10-CM

## 2013-04-14 NOTE — Progress Notes (Signed)
Subjective:     Tina Meyer is a 52 y.o. female and is here for a comprehensive physical exam. The patient reports no problems. She has not had a screening colonoscopy. I did give her still cards last year but she says she never needed them.  History   Social History  . Marital Status: Widowed    Spouse Name: N/A    Number of Children: 0  . Years of Education: N/A   Occupational History  . dental office     [employer]   Social History Main Topics  . Smoking status: Never Smoker   . Smokeless tobacco: Not on file  . Alcohol Use: No  . Drug Use: No  . Sexual Activity:    Other Topics Concern  . Not on file   Social History Narrative   Had dogs.  Occ exercise.     Health Maintenance  Topic Date Due  . Influenza Vaccine  12/12/2013  . Colonoscopy  12/12/2013  . Mammogram  11/05/2014  . Tetanus/tdap  10/11/2022    The following portions of the patient's history were reviewed and updated as appropriate: allergies, current medications, past family history, past medical history, past social history, past surgical history and problem list.  Review of Systems A comprehensive review of systems was negative.   Objective:    BP 112/65  Pulse 72  Temp(Src) 98.1 F (36.7 C)  Ht 5\' 3"  (1.6 m)  Wt 151 lb (68.493 kg)  BMI 26.76 kg/m2 General appearance: alert, cooperative and appears stated age Head: Normocephalic, without obvious abnormality, atraumatic Eyes: conj clear, EOMI, PEERLA Ears: normal TM's and external ear canals both ears Nose: Nares normal. Septum midline. Mucosa normal. No drainage or sinus tenderness. Throat: lips, mucosa, and tongue normal; teeth and gums normal Neck: no adenopathy, no carotid bruit, no JVD, supple, symmetrical, trachea midline and thyroid not enlarged, symmetric, no tenderness/mass/nodules Back: symmetric, no curvature. ROM normal. No CVA tenderness. Lungs: clear to auscultation bilaterally Heart: systolic  murmur: late systolic 3/6, harsh and squeaking at 2nd left intercostal space Abdomen: soft, non-tender; bowel sounds normal; no masses,  no organomegaly Extremities: extremities normal, atraumatic, no cyanosis or edema Pulses: 2+ and symmetric Skin: Skin color, texture, turgor normal. No rashes or lesions Lymph nodes: Cervical, supraclavicular, and axillary nodes normal. Neurologic: Alert and oriented X 3, normal strength and tone. Normal symmetric reflexes. Normal coordination and gait    Assessment:    Healthy female exam.      Plan:     See After Visit Summary for Counseling Recommendations  Keep up a regular exercise program and make sure you are eating a healthy diet Try to eat 4 servings of dairy a day, or if you are lactose intolerant take a calcium with vitamin D daily.  Your vaccines are up to date.    Vitamin D deficiency-she would like to recheck her levels. She's not currently taking any supplement.  She also has a history of anemia with a GI ulcer-she would like to have her iron, B12 and folate rechecked as well as hemoglobin.  Due for screening colonoscopy-she declines today but says she is willing to do stool cards.  Heart murmur - will get echo. It has been several years since her last echo. In fact it's been so long she doesn't remember when the last one was. Her murmur is concerning for possible aortic stenosis. She's asymptomatic but would like to get an echocardiogram to further evaluate her heart  valves

## 2013-04-17 ENCOUNTER — Telehealth: Payer: Self-pay | Admitting: Family Medicine

## 2013-04-17 LAB — COMPLETE METABOLIC PANEL WITH GFR
ALT: 32 U/L (ref 0–35)
AST: 29 U/L (ref 0–37)
Albumin: 4.8 g/dL (ref 3.5–5.2)
Alkaline Phosphatase: 51 U/L (ref 39–117)
BUN: 16 mg/dL (ref 6–23)
CO2: 28 mEq/L (ref 19–32)
Calcium: 9.3 mg/dL (ref 8.4–10.5)
Chloride: 103 mEq/L (ref 96–112)
Creat: 0.93 mg/dL (ref 0.50–1.10)
GFR, Est African American: 82 mL/min
GFR, Est Non African American: 71 mL/min
Glucose, Bld: 98 mg/dL (ref 70–99)
Potassium: 3.9 mEq/L (ref 3.5–5.3)
Sodium: 138 mEq/L (ref 135–145)
Total Bilirubin: 0.4 mg/dL (ref 0.3–1.2)
Total Protein: 7.5 g/dL (ref 6.0–8.3)

## 2013-04-17 LAB — CBC
HCT: 39.3 % (ref 36.0–46.0)
Hemoglobin: 14 g/dL (ref 12.0–15.0)
MCH: 28.6 pg (ref 26.0–34.0)
MCHC: 35.6 g/dL (ref 30.0–36.0)
MCV: 80.4 fL (ref 78.0–100.0)
Platelets: 295 10*3/uL (ref 150–400)
RBC: 4.89 MIL/uL (ref 3.87–5.11)
RDW: 13.8 % (ref 11.5–15.5)
WBC: 6.6 10*3/uL (ref 4.0–10.5)

## 2013-04-17 LAB — TSH: TSH: 2.724 u[IU]/mL (ref 0.350–4.500)

## 2013-04-17 LAB — FERRITIN: Ferritin: 99 ng/mL (ref 10–291)

## 2013-04-17 LAB — LIPID PANEL
Cholesterol: 197 mg/dL (ref 0–200)
HDL: 61 mg/dL (ref >39)
LDL Cholesterol: 94 mg/dL (ref 0–99)
Total CHOL/HDL Ratio: 3.2 Ratio
Triglycerides: 211 mg/dL — ABNORMAL HIGH (ref <150)
VLDL: 42 mg/dL — ABNORMAL HIGH (ref 0–40)

## 2013-04-17 LAB — VITAMIN B12: Vitamin B-12: 510 pg/mL (ref 211–911)

## 2013-04-17 LAB — FOLATE: Folate: >20 ng/mL

## 2013-04-17 NOTE — Telephone Encounter (Signed)
Please call patient: Got her echocardiogram results back today. She has been pumping function of her heart. She did have a little bit of tricuspid regurgitation. Which is a little bit of leakage at the tricuspid valve. Very small amount. She also had some aortic sclerosis. The valve did open well but is a little bit stiff from scar tissue. This may have been from birth or in may have been some damage from an illness as a child. Consider repeating echo in 4-5 years.

## 2013-04-18 LAB — VITAMIN D 25 HYDROXY (VIT D DEFICIENCY, FRACTURES): Vit D, 25-Hydroxy: 58 ng/mL (ref 30–89)

## 2013-04-20 NOTE — Telephone Encounter (Signed)
Pt informed and would like report sent to her.Marland KitchenMarland KitchenMarland KitchenAudelia Hives Meyer

## 2013-04-21 ENCOUNTER — Ambulatory Visit (INDEPENDENT_AMBULATORY_CARE_PROVIDER_SITE_OTHER): Payer: Commercial Indemnity | Admitting: Family Medicine

## 2013-04-21 ENCOUNTER — Encounter: Payer: Self-pay | Admitting: *Deleted

## 2013-04-21 ENCOUNTER — Telehealth: Payer: Self-pay | Admitting: *Deleted

## 2013-04-21 ENCOUNTER — Encounter: Payer: Self-pay | Admitting: Family Medicine

## 2013-04-21 VITALS — BP 112/68 | HR 84 | Temp 98.9°F | Wt 148.0 lb

## 2013-04-21 DIAGNOSIS — A084 Viral intestinal infection, unspecified: Secondary | ICD-10-CM

## 2013-04-21 DIAGNOSIS — A088 Other specified intestinal infections: Secondary | ICD-10-CM

## 2013-04-21 DIAGNOSIS — R6889 Other general symptoms and signs: Secondary | ICD-10-CM

## 2013-04-21 LAB — POC INFLUENZA A&B (BINAX/QUICKVUE)
Influenza A, POC: NEGATIVE
Influenza B, POC: NEGATIVE

## 2013-04-21 MED ORDER — PROMETHAZINE HCL 25 MG RE SUPP: 25.0000 mg | Freq: Four times a day (QID) | RECTAL | Status: DC | PRN

## 2013-04-21 MED ORDER — PROMETHAZINE HCL 25 MG PO TABS: 25.0000 mg | ORAL_TABLET | Freq: Four times a day (QID) | ORAL | Status: DC | PRN

## 2013-04-21 NOTE — Telephone Encounter (Signed)
She has a viral illness. In which case antibiotics will not help. She is still contagious though. Make sure staying in her room. Not to share food, utensils etc. To run into a dishwasher possible which sensitizes the dishes. Try to use separate bathrooms if possible.

## 2013-04-21 NOTE — Progress Notes (Signed)
   Subjective:    Patient ID: Tina Meyer, female    DOB: 10-31-1960, 53 y.o.   MRN: [id number]  HPI On Sunday had fever, diarrhea, and achey. Then yesterday started getting ST.  Some facial pain and pressure and congestion. + cough.  Took some tylenol yesterday.  Has vomited 3 days.  Still feels really nauseated.  She drove here today. Right ear pain.    Review of Systems     Objective:   Physical Exam  Constitutional: She is oriented to person, place, and time. She appears well-developed and well-nourished.  HENT:  Head: Normocephalic and atraumatic.  Right Ear: External ear normal.  Left Ear: External ear normal.  Nose: Nose normal.  Mouth/Throat: Oropharynx is clear and moist.  TMs and canals are clear.   Eyes: Conjunctivae and EOM are normal. Pupils are equal, round, and reactive to light.  Neck: Neck supple. No thyromegaly present.  Cardiovascular: Normal rate, regular rhythm and normal heart sounds.   Pulmonary/Chest: Effort normal and breath sounds normal. She has no wheezes.  Lymphadenopathy:    She has no cervical adenopathy.  Neurological: She is alert and oriented to person, place, and time.  Skin: Skin is warm and dry.  Psychiatric: She has a normal mood and affect.          Assessment & Plan:  Viral gastroenteritis - she does have a slight cough and mild congestion but her predominant symptoms are nausea, vomiting, diarrhea. Will send her a prescription for Phenergan tabs and Phenergan suppository. Recommend stay hydrated, get lots of rest. He did not go back to work until afebrile for at least 24 hours. Work note provided. Call his need any further recommendations. Otherwise continue supportive care.  Viral URI - see above.

## 2013-04-21 NOTE — Patient Instructions (Signed)
Stay hydrated. Call if still having fever.

## 2013-04-21 NOTE — Telephone Encounter (Signed)
Pt's room mate informed.  Oscar La, LPN

## 2013-04-21 NOTE — Telephone Encounter (Signed)
Pt states her room mate has hodgin's disease and states her WBC count is low and says she forgot to ask if she needs to be on any abx so that she doesn't make her room mate any complications. Please advise.  Oscar La, LPN

## 2013-04-24 ENCOUNTER — Other Ambulatory Visit: Payer: Self-pay | Admitting: Family Medicine

## 2013-05-04 ENCOUNTER — Other Ambulatory Visit: Payer: Self-pay | Admitting: Family Medicine

## 2013-05-05 ENCOUNTER — Encounter: Payer: Self-pay | Admitting: Family Medicine

## 2013-05-12 ENCOUNTER — Other Ambulatory Visit: Payer: Self-pay | Admitting: Family Medicine

## 2013-05-28 ENCOUNTER — Other Ambulatory Visit: Payer: Self-pay | Admitting: Family Medicine

## 2013-06-02 ENCOUNTER — Other Ambulatory Visit: Payer: Self-pay | Admitting: Family Medicine

## 2013-06-29 ENCOUNTER — Other Ambulatory Visit: Payer: Self-pay | Admitting: Physician Assistant

## 2013-07-25 ENCOUNTER — Other Ambulatory Visit: Payer: Self-pay | Admitting: Family Medicine

## 2013-08-06 ENCOUNTER — Other Ambulatory Visit: Payer: Self-pay | Admitting: Family Medicine

## 2013-08-29 ENCOUNTER — Other Ambulatory Visit: Payer: Self-pay | Admitting: Family Medicine

## 2013-09-09 ENCOUNTER — Other Ambulatory Visit: Payer: Self-pay | Admitting: Physician Assistant

## 2013-09-09 ENCOUNTER — Other Ambulatory Visit: Payer: Self-pay | Admitting: Family Medicine

## 2013-10-22 ENCOUNTER — Other Ambulatory Visit: Payer: Self-pay | Admitting: Family Medicine

## 2013-10-29 ENCOUNTER — Encounter: Payer: Commercial Indemnity | Admitting: Family Medicine

## 2013-10-29 ENCOUNTER — Ambulatory Visit (INDEPENDENT_AMBULATORY_CARE_PROVIDER_SITE_OTHER): Payer: Commercial Indemnity | Admitting: Family Medicine

## 2013-10-29 ENCOUNTER — Encounter: Payer: Self-pay | Admitting: Family Medicine

## 2013-10-29 VITALS — BP 100/65 | HR 89 | Ht 62.0 in | Wt 146.0 lb

## 2013-10-29 DIAGNOSIS — I10 Essential (primary) hypertension: Secondary | ICD-10-CM

## 2013-10-29 DIAGNOSIS — Z0000 Encounter for general adult medical examination without abnormal findings: Secondary | ICD-10-CM

## 2013-10-29 MED ORDER — ESTRADIOL 0.05 MG/24HR TD PTTW: 1.0000 | MEDICATED_PATCH | TRANSDERMAL | Status: DC

## 2013-10-29 MED ORDER — ALPRAZOLAM 0.5 MG PO TABS: ORAL_TABLET | ORAL | Status: DC

## 2013-10-29 NOTE — Progress Notes (Signed)
  Subjective:     Tina Meyer is a 53 y.o. female and is here for a comprehensive physical exam. The patient reports no problems.  History   Social History  . Marital Status: Widowed    Spouse Name: N/A    Number of Children: 0  . Years of Education: N/A   Occupational History  . dental office     [employer]   Social History Main Topics  . Smoking status: Never Smoker   . Smokeless tobacco: Not on file  . Alcohol Use: No  . Drug Use: No  . Sexual Activity:    Other Topics Concern  . Not on file   Social History Narrative   Had dogs.  Occ exercise.     Health Maintenance  Topic Date Due  . Colonoscopy  12/12/2013 (Originally 07/30/2010)  . Influenza Vaccine  11/14/2013  . Mammogram  11/05/2014  . Tetanus/tdap  10/11/2022    The following portions of the patient's history were reviewed and updated as appropriate: allergies, current medications, past family history, past medical history, past social history, past surgical history and problem list.  Review of Systems A comprehensive review of systems was negative.   Objective:    BP 100/65  Pulse 89  Ht 5\' 2"  (1.575 m)  Wt 146 lb (66.225 kg)  BMI 26.70 kg/m2 General appearance: alert, cooperative and appears stated age Head: Normocephalic, without obvious abnormality, atraumatic Eyes: conj clear, EOMi, PEERLA Ears: normal TM's and external ear canals both ears Nose: Nares normal. Septum midline. Mucosa normal. No drainage or sinus tenderness. Throat: lips, mucosa, and tongue normal; teeth and gums normal Neck: no adenopathy, no carotid bruit, no JVD, supple, symmetrical, trachea midline and thyroid not enlarged, symmetric, no tenderness/mass/nodules Back: symmetric, no curvature. ROM normal. No CVA tenderness. Lungs: clear to auscultation bilaterally Breasts: normal appearance, no masses or tenderness Heart: regular rate and rhythm, S1, S2 normal, no murmur, click, rub or gallop Abdomen:  soft, non-tender; bowel sounds normal; no masses,  no organomegaly Pelvic: deferred Extremities: extremities normal, atraumatic, no cyanosis or edema Pulses: 2+ and symmetric Skin: Skin color, texture, turgor normal. No rashes or lesions Lymph nodes: Cervical, supraclavicular, and axillary nodes normal. Neurologic: Alert and oriented X 3, normal strength and tone. Normal symmetric reflexes. Normal coordination and gait    Assessment:    Healthy female exam.      Plan:     See After Visit Summary for Counseling Recommendations  Keep up a regular exercise program and make sure you are eating a healthy diet Try to eat 4 servings of dairy a day, or if you are lactose intolerant take a calcium with vitamin D daily.  Your vaccines are up to date.   Discussed need for shingles vaccine. Additional information provided. Encouraged her to check with insurance.  Hypertension-well-controlled. Continue current regimen. Due for BMP today. Followup in 6 months.  Mammogram order placed.

## 2013-10-29 NOTE — Patient Instructions (Signed)
Keep up a regular exercise program and make sure you are eating a healthy diet Try to eat 4 servings of dairy a day, or if you are lactose intolerant take a calcium with vitamin D daily.  Your vaccines are up to date.   

## 2013-10-30 ENCOUNTER — Other Ambulatory Visit: Payer: Self-pay

## 2013-10-30 MED ORDER — HYDROCODONE-HOMATROPINE 5-1.5 MG/5ML PO SYRP: 5.0000 mL | ORAL_SOLUTION | Freq: Every evening | ORAL | Status: DC | PRN

## 2013-10-30 NOTE — Telephone Encounter (Signed)
Please verify how long she has been sick. I think when she came in yesterday she said she had only been sick for about a day. If that is the case then it is not long enough to treat with an antibiotic. She is welcomed to call back next week if she still not feeling well but this is most likely a virus as her exam was normal yesterday and most viruses last between 7-14 days. I will be happy to provide a work note. We can also write a prescription for cough medicine but she will still have to come pick it up because it has hydrocodone in it.

## 2013-10-30 NOTE — Telephone Encounter (Signed)
I spoke with Tina Meyer and informed her that she can pick up her Rx for the cough medicine. I also informed her that it sounds like she has a virus and that its has only been 2 days of her symptoms but if she is not feeling better next week that she needs to come in and see Dr. Madilyn Fireman. I explaining to her that an antibiotic does not kill a virus that it just has to run it course to about 7 - 14 days. I informed her that she can take tylenol sinus for the body aches and sinus congestion./Laxmi Choung,CMA

## 2013-10-30 NOTE — Telephone Encounter (Signed)
Tina Meyer called and stated that she had a CPE yesterday and she was not feeling well. She is having body aches, chills, sinus pain and congestion and cough. She stated that she took (2) 500 mg Tylenol around 8 am this morning but so far it has not help. She also stated that she was leaving work early to go home. She would like to see if something could be called in and is a cough medicine could also be called in./Calvert Charland,CMA

## 2013-12-02 ENCOUNTER — Encounter: Payer: Commercial Indemnity | Admitting: Family Medicine

## 2014-01-08 ENCOUNTER — Other Ambulatory Visit: Payer: Self-pay | Admitting: Family Medicine

## 2014-01-13 ENCOUNTER — Ambulatory Visit (INDEPENDENT_AMBULATORY_CARE_PROVIDER_SITE_OTHER): Payer: Commercial Indemnity

## 2014-01-13 ENCOUNTER — Ambulatory Visit: Payer: Commercial Indemnity

## 2014-01-13 DIAGNOSIS — Z0000 Encounter for general adult medical examination without abnormal findings: Secondary | ICD-10-CM

## 2014-01-13 DIAGNOSIS — Z1231 Encounter for screening mammogram for malignant neoplasm of breast: Secondary | ICD-10-CM

## 2014-01-29 ENCOUNTER — Telehealth: Payer: Self-pay | Admitting: *Deleted

## 2014-01-29 MED ORDER — HYDROCODONE-HOMATROPINE 5-1.5 MG/5ML PO SYRP: 5.0000 mL | ORAL_SOLUTION | Freq: Every evening | ORAL | Status: DC | PRN

## 2014-01-29 NOTE — Telephone Encounter (Signed)
Pt called and would like a rx for cough med. She was seen at [facility] cough and [facility]itis. Spoke w/Dr. Madilyn Fireman she agreed to rx for cough med. Informed her that she can come p/u @ front desk.Audelia Hives [place]

## 2014-02-01 ENCOUNTER — Other Ambulatory Visit: Payer: Self-pay | Admitting: Family Medicine

## 2014-02-02 ENCOUNTER — Telehealth: Payer: Self-pay

## 2014-02-02 MED ORDER — AZITHROMYCIN 250 MG PO TABS: ORAL_TABLET | ORAL | Status: DC

## 2014-02-02 NOTE — Telephone Encounter (Signed)
What is is for?

## 2014-02-02 NOTE — Telephone Encounter (Signed)
Tina Meyer states she was seen at the [facility] on Wednesday. She states she is unable to take the Augmentin due to vomiting. Could she have a different antibiotic? [facility]. Please advise.

## 2014-02-02 NOTE — Telephone Encounter (Signed)
Tina Meyer said that she was there for what they assumed to be allergies but it is now in her chest and she reports a deep, loose, wet non-producing cough and fever yesterday. The augmentin is making her sick and she reports that she is worse not better. Margette Fast, CMA

## 2014-02-02 NOTE — Telephone Encounter (Signed)
rx sent for zpack. If no better by Monday then needs appt

## 2014-02-03 NOTE — Telephone Encounter (Signed)
I called Kim's home and she was at work so I left a voice mail on her cell phone to pick up zpack and if she was not better by Monday to schedule an appt to be seen. Margette Fast, CMA

## 2014-02-08 ENCOUNTER — Ambulatory Visit (INDEPENDENT_AMBULATORY_CARE_PROVIDER_SITE_OTHER): Payer: Commercial Indemnity | Admitting: Family Medicine

## 2014-02-08 VITALS — Temp 98.0°F

## 2014-02-08 DIAGNOSIS — Z23 Encounter for immunization: Secondary | ICD-10-CM

## 2014-02-08 NOTE — Progress Notes (Signed)
   Subjective:    Patient ID: Tina Meyer, female    DOB: June 17, 1960, 53 y.o.   MRN: [id number]  HPI  Quynh is here for a flu vaccine.   Review of Systems     Objective:   Physical Exam        Assessment & Plan:  Patient tolerated injection well without any complications.

## 2014-03-05 ENCOUNTER — Ambulatory Visit (INDEPENDENT_AMBULATORY_CARE_PROVIDER_SITE_OTHER): Payer: Commercial Indemnity | Admitting: Family Medicine

## 2014-03-05 ENCOUNTER — Encounter: Payer: Self-pay | Admitting: Family Medicine

## 2014-03-05 VITALS — BP 93/55 | HR 97 | Temp 97.2°F | Wt 137.0 lb

## 2014-03-05 DIAGNOSIS — I781 Nevus, non-neoplastic: Secondary | ICD-10-CM

## 2014-03-05 DIAGNOSIS — D235 Other benign neoplasm of skin of trunk: Secondary | ICD-10-CM

## 2014-03-05 MED ORDER — ALPRAZOLAM 0.5 MG PO TABS: 0.5000 mg | ORAL_TABLET | Freq: Every day | ORAL | Status: DC

## 2014-03-05 MED ORDER — ALPRAZOLAM 0.5 MG PO TABS: ORAL_TABLET | ORAL | Status: DC

## 2014-03-05 NOTE — Progress Notes (Signed)
   Subjective:    Patient ID: Tina Meyer, female    DOB: March 29, 1961, 53 y.o.   MRN: [id number]  HPI F/U in on mole on her right breast.  I have mentioned to keep an eye on it at previous visit.  She's never had any prior, personal history of skin cancer or lesions.  She lost someone in her life from [place]. She feels that this is made her a little bit more hypervigilant about her own health care.   Review of Systems     Objective:   Physical Exam  It's an atypical nevus near the center of the chest just on the upper edge of her right breast. It's very small. Apparently half a centimeter oval shaped. It does have a little bit darker pigmentation just at the edge of the bottom of the mole. No concerning features at this point in time.      Assessment & Plan:  Lesion on the right breast. Gave reasurace. Encouraged her to take a picture with her phone and follow-up every 6-12 months to see if it's changing and if it is be happy to do a shave biopsy. We could also do a biopsy today if she would like if it would make her more comfortable with the situation since she's clearly very worried about it.  Beatrice Lecher, MD

## 2014-03-29 ENCOUNTER — Other Ambulatory Visit: Payer: Self-pay | Admitting: Family Medicine

## 2014-04-17 ENCOUNTER — Other Ambulatory Visit: Payer: Self-pay | Admitting: Family Medicine

## 2014-05-28 ENCOUNTER — Other Ambulatory Visit: Payer: Self-pay | Admitting: Family Medicine

## 2014-05-28 NOTE — Telephone Encounter (Signed)
I am not sure if you want to continue will refills. Note states that you called the pharmacy to cancel.

## 2014-06-06 ENCOUNTER — Other Ambulatory Visit: Payer: Self-pay | Admitting: Family Medicine

## 2014-06-18 ENCOUNTER — Other Ambulatory Visit: Payer: Self-pay | Admitting: Family Medicine

## 2014-06-22 ENCOUNTER — Other Ambulatory Visit: Payer: Self-pay | Admitting: Family Medicine

## 2014-06-26 ENCOUNTER — Other Ambulatory Visit: Payer: Self-pay | Admitting: Family Medicine

## 2014-07-01 ENCOUNTER — Other Ambulatory Visit: Payer: Self-pay | Admitting: *Deleted

## 2014-07-01 ENCOUNTER — Encounter: Payer: Self-pay | Admitting: Family Medicine

## 2014-07-01 MED ORDER — AZELASTINE HCL 0.1 % NA SOLN: 2.0000 | Freq: Two times a day (BID) | NASAL | Status: DC

## 2014-07-01 NOTE — Progress Notes (Signed)
rx sent.Tina Meyer  

## 2014-07-04 ENCOUNTER — Other Ambulatory Visit: Payer: Self-pay | Admitting: Family Medicine

## 2014-07-05 ENCOUNTER — Ambulatory Visit: Payer: Commercial Indemnity | Admitting: Family Medicine

## 2014-07-06 ENCOUNTER — Other Ambulatory Visit: Payer: Self-pay | Admitting: Family Medicine

## 2014-07-16 ENCOUNTER — Encounter: Payer: Self-pay | Admitting: Family Medicine

## 2014-07-16 ENCOUNTER — Ambulatory Visit (INDEPENDENT_AMBULATORY_CARE_PROVIDER_SITE_OTHER): Payer: BLUE CROSS/BLUE SHIELD | Admitting: Family Medicine

## 2014-07-16 VITALS — BP 116/62 | HR 79 | Ht 62.0 in | Wt 148.0 lb

## 2014-07-16 DIAGNOSIS — I10 Essential (primary) hypertension: Secondary | ICD-10-CM

## 2014-07-16 DIAGNOSIS — Z1159 Encounter for screening for other viral diseases: Secondary | ICD-10-CM

## 2014-07-16 DIAGNOSIS — Z114 Encounter for screening for human immunodeficiency virus [HIV]: Secondary | ICD-10-CM

## 2014-07-16 DIAGNOSIS — E785 Hyperlipidemia, unspecified: Secondary | ICD-10-CM

## 2014-07-16 DIAGNOSIS — N952 Postmenopausal atrophic vaginitis: Secondary | ICD-10-CM

## 2014-07-16 LAB — COMPLETE METABOLIC PANEL WITH GFR
ALBUMIN: 4.7 g/dL (ref 3.5–5.2)
ALT: 17 U/L (ref 0–35)
AST: 21 U/L (ref 0–37)
Alkaline Phosphatase: 61 U/L (ref 39–117)
BUN: 19 mg/dL (ref 6–23)
CHLORIDE: 103 meq/L (ref 96–112)
CO2: 28 mEq/L (ref 19–32)
Calcium: 9.7 mg/dL (ref 8.4–10.5)
Creat: 0.94 mg/dL (ref 0.50–1.10)
GFR, Est African American: 80 mL/min
GFR, Est Non African American: 69 mL/min
Glucose, Bld: 88 mg/dL (ref 70–99)
Potassium: 4.1 mEq/L (ref 3.5–5.3)
SODIUM: 142 meq/L (ref 135–145)
TOTAL PROTEIN: 7.7 g/dL (ref 6.0–8.3)
Total Bilirubin: 0.5 mg/dL (ref 0.2–1.2)

## 2014-07-16 LAB — LIPID PANEL
CHOLESTEROL: 325 mg/dL — AB (ref 0–200)
HDL: 67 mg/dL (ref >=46)
LDL CALC: 227 mg/dL — AB (ref 0–99)
Total CHOL/HDL Ratio: 4.9 Ratio
Triglycerides: 154 mg/dL — ABNORMAL HIGH (ref <150)
VLDL: 31 mg/dL (ref 0–40)

## 2014-07-16 MED ORDER — ESTRADIOL 0.1 MG/GM VA CREA: 1.0000 | TOPICAL_CREAM | Freq: Every day | VAGINAL | Status: DC

## 2014-07-16 MED ORDER — ATORVASTATIN CALCIUM 40 MG PO TABS: 40.0000 mg | ORAL_TABLET | Freq: Every day | ORAL | Status: DC

## 2014-07-16 MED ORDER — LOSARTAN POTASSIUM-HCTZ 50-12.5 MG PO TABS: 1.0000 | ORAL_TABLET | Freq: Every day | ORAL | Status: DC

## 2014-07-16 NOTE — Progress Notes (Signed)
   Subjective:    Patient ID: Tina Meyer, female    DOB: 1960-10-24, 54 y.o.   MRN: [id number]  HPI Hypertension- Pt denies chest pain, SOB, dizziness, or heart palpitations.  Taking meds as directed w/o problems.  Denies medication side effects.  She would ike to change her Benicar She feels like could be causing some hairloss.    Hyperlipidemia - says would .ike to change her simvastatin to atorvastain. She wonders if could be causing some hair loss.   She would like to consider getting back on her estradiol.    Review of Systems     Objective:   Physical Exam  Constitutional: She is oriented to person, place, and time. She appears well-developed and well-nourished.  HENT:  Head: Normocephalic and atraumatic.  Cardiovascular: Normal rate, regular rhythm and normal heart sounds.   Pulmonary/Chest: Effort normal and breath sounds normal.  Neurological: She is alert and oriented to person, place, and time.  Skin: Skin is warm and dry.  Psychiatric: She has a normal mood and affect. Her behavior is normal.          Assessment & Plan:  HTN - well controlled.  She would like to change medications.  F/U in 6 months.  For CMP and fasting lipid panel.   Hyperlipidemia- Due for CMP and lipids.  Will change to lipitor. She had tried it before but says she is willing to try it again.  Will send to mail order.   HRT - vaginal dryness - discussed options. Discussed potential risks with continued therapy with estrogen. Recommend switching to topical estrogen to particularly target the vaginal dryness and atrophy. She's willing to try this. New prescription sent. Follow-up in 6 months.

## 2014-07-17 LAB — HEPATITIS C ANTIBODY: HCV AB: NEGATIVE

## 2014-07-17 LAB — HIV ANTIBODY (ROUTINE TESTING W REFLEX): HIV 1&2 Ab, 4th Generation: NONREACTIVE

## 2014-07-23 ENCOUNTER — Other Ambulatory Visit: Payer: Self-pay | Admitting: Family Medicine

## 2014-07-27 ENCOUNTER — Telehealth: Payer: Self-pay | Admitting: *Deleted

## 2014-07-27 MED ORDER — ALPRAZOLAM 0.5 MG PO TABS: ORAL_TABLET | ORAL | Status: DC

## 2014-07-27 MED ORDER — TRAZODONE HCL 50 MG PO TABS: 25.0000 mg | ORAL_TABLET | Freq: Every day | ORAL | Status: DC

## 2014-07-27 MED ORDER — FLUVOXAMINE MALEATE 100 MG PO TABS: 100.0000 mg | ORAL_TABLET | Freq: Every day | ORAL | Status: DC

## 2014-07-27 NOTE — Telephone Encounter (Signed)
Pt left vm asking for refills on her fluvoxetine, alprazolam, & trazadone. I didn't see any documentation regarding when follow ups were needed for these meds.  Ok to fill or is an appt needed?  Please advise.

## 2014-07-27 NOTE — Telephone Encounter (Signed)
[facility].Audelia Hives [place]

## 2014-09-15 ENCOUNTER — Other Ambulatory Visit: Payer: Self-pay | Admitting: Family Medicine

## 2014-11-16 ENCOUNTER — Encounter: Payer: Self-pay | Admitting: Family Medicine

## 2014-11-16 ENCOUNTER — Ambulatory Visit (INDEPENDENT_AMBULATORY_CARE_PROVIDER_SITE_OTHER): Payer: BLUE CROSS/BLUE SHIELD | Admitting: Family Medicine

## 2014-11-16 VITALS — BP 112/73 | HR 88 | Temp 98.1°F | Ht 62.0 in | Wt 152.0 lb

## 2014-11-16 DIAGNOSIS — E785 Hyperlipidemia, unspecified: Secondary | ICD-10-CM

## 2014-11-16 DIAGNOSIS — Z0189 Encounter for other specified special examinations: Secondary | ICD-10-CM

## 2014-11-16 DIAGNOSIS — Z0000 Encounter for general adult medical examination without abnormal findings: Secondary | ICD-10-CM

## 2014-11-16 NOTE — Patient Instructions (Signed)
Keep up a regular exercise program and make sure you are eating a healthy diet Try to eat 4 servings of dairy a day, or if you are lactose intolerant take a calcium with vitamin D daily.  Your vaccines are up to date.   

## 2014-11-16 NOTE — Progress Notes (Signed)
Subjective:     Tina Meyer is a 54 y.o. female and is here for a comprehensive physical exam. The patient reports no problems. She has been having some pelvic pressure but no abnormal discharge or rashes etc. she actually had blood work done recently.  History   Social History  . Marital Status: Widowed    Spouse Name: N/A  . Number of Children: 0  . Years of Education: N/A   Occupational History  . dental office     [employer]   Social History Main Topics  . Smoking status: Never Smoker   . Smokeless tobacco: Not on file  . Alcohol Use: No  . Drug Use: No  . Sexual Activity: Not Currently   Other Topics Concern  . Not on file   Social History Narrative   Had dogs.  Occ exercise.  1 caffeine drink per days.     Health Maintenance  Topic Date Due  . COLONOSCOPY  07/30/2010  . INFLUENZA VACCINE  11/15/2014  . MAMMOGRAM  01/14/2016  . TETANUS/TDAP  10/11/2022  . Hepatitis C Screening  Completed  . HIV Screening  Completed    The following portions of the patient's history were reviewed and updated as appropriate: allergies, current medications, past family history, past medical history, past social history, past surgical history and problem list.  Review of Systems A comprehensive review of systems was negative.   Objective:    BP 112/73 mmHg  Pulse 88  Temp(Src) 98.1 F (36.7 C) (Oral)  Ht 5\' 2"  (1.575 m)  Wt 152 lb (68.947 kg)  BMI 27.79 kg/m2  SpO2 94% General appearance: alert, cooperative and appears stated age Head: Normocephalic, without obvious abnormality, atraumatic Eyes: conj clear, EOMi, PEERLA Ears: normal TM's and external ear canals both ears Nose: Nares normal. Septum midline. Mucosa normal. No drainage or sinus tenderness. Throat: lips, mucosa, and tongue normal; teeth and gums normal Neck: no adenopathy, no carotid bruit, no JVD, supple, symmetrical, trachea midline and thyroid not enlarged, symmetric, no  tenderness/mass/nodules Back: symmetric, no curvature. ROM normal. No CVA tenderness. Lungs: clear to auscultation bilaterally Breasts: normal appearance, no masses or tenderness Heart: 2/6 SEM Abdomen: soft, non-tender; bowel sounds normal; no masses,  no organomegaly Pelvic: external genitalia normal, no adnexal masses or tenderness, rectovaginal septum normal, uterus surgically absent and vagina normal without discharge Extremities: extremities normal, atraumatic, no cyanosis or edema Pulses: 2+ and symmetric Skin: Skin color, texture, turgor normal. No rashes or lesions Lymph nodes: Cervical, supraclavicular, and axillary nodes normal. Neurologic: Alert and oriented X 3, normal strength and tone. Normal symmetric reflexes. Normal coordination and gait    Assessment:    Healthy female exam.      Plan:     See After Visit Summary for Counseling Recommendations  Keep up a regular exercise program and make sure you are eating a healthy diet Try to eat 4 servings of dairy a day, or if you are lactose intolerant take a calcium with vitamin D daily.  Your vaccines are up to date.   Discussed need for colonscopy. She declined but says she will do stool cards.   Hyperlipidemia-her last lipid level jumped up. We had called her to see if she been taking her Lipitor consistently but she really couldn't remember. Thus I encouraged her to let us recheck it in about 6 months. Printed lab slip today and she can go in the next couple months. Just make sure taking consistently and  make sure fasting.

## 2015-01-24 ENCOUNTER — Other Ambulatory Visit: Payer: Self-pay | Admitting: *Deleted

## 2015-02-08 ENCOUNTER — Other Ambulatory Visit: Payer: Self-pay | Admitting: Family Medicine

## 2015-02-08 MED ORDER — ALPRAZOLAM 0.5 MG PO TABS: ORAL_TABLET | ORAL | Status: DC

## 2015-02-28 ENCOUNTER — Other Ambulatory Visit: Payer: Self-pay | Admitting: Family Medicine

## 2015-05-15 ENCOUNTER — Emergency Department
Admission: EM | Admit: 2015-05-15 | Discharge: 2015-05-15 | Disposition: A | Discharge status: Home / Self Care | Payer: BLUE CROSS/BLUE SHIELD | Source: Home / Self Care | Attending: Family Medicine | Admitting: Family Medicine

## 2015-05-15 ENCOUNTER — Encounter: Payer: Self-pay | Admitting: Emergency Medicine

## 2015-05-15 DIAGNOSIS — L298 Other pruritus: Secondary | ICD-10-CM

## 2015-05-15 DIAGNOSIS — R21 Rash and other nonspecific skin eruption: Secondary | ICD-10-CM

## 2015-05-15 MED ORDER — HYDROXYZINE HCL 25 MG PO TABS: 25.0000 mg | ORAL_TABLET | Freq: Four times a day (QID) | ORAL | Status: DC

## 2015-05-15 MED ORDER — PREDNISONE 20 MG PO TABS: ORAL_TABLET | ORAL | Status: DC

## 2015-05-15 MED ORDER — DEXAMETHASONE SODIUM PHOSPHATE 10 MG/ML IJ SOLN
10.0000 mg | Freq: Once | INTRAMUSCULAR | Status: AC
  Administered 2015-05-15: 10 mg via INTRAMUSCULAR

## 2015-05-15 MED ORDER — VALACYCLOVIR HCL 1 G PO TABS: 1000.0000 mg | ORAL_TABLET | Freq: Three times a day (TID) | ORAL | Status: DC

## 2015-05-15 NOTE — Discharge Instructions (Signed)
The rash you have may be due to poison ivy/poison oak, however, due to the rash being painful and mainly on Left side, you may also have a shingles rash.  It is import to follow up with your primary care provider later this week for further evaluation of rash, especially if rash continues to worsen on your face and scalp.

## 2015-05-15 NOTE — ED Provider Notes (Signed)
CSN: [id number]     Arrival date & time 05/15/15  1332 History   First MD Initiated Contact with Patient 05/15/15 1355     Chief Complaint  Patient presents with  . Rash   (Consider location/radiation/quality/duration/timing/severity/associated sxs/prior Treatment) HPI Pt is a 55yo female presenting to [facility] with c/o erythematous pruritic, burning, painful rash that started about 2 weeks ago.  Pain is not relieved with benadryl or calamine lotions.  Rash initially started on her hands but has spread to the Left lower side of her abdomen around her Left mid back, scattered areas on her chest, face and scalp.  Majority of rash is on the Left side of her body.  Rash started a day or two after she was doing yard work but does admit she continued to wear same clothes and use same towels and sheets but decided to wash all today as she is worried the oils have caused the rash to keep spreading. Denies fever, chills, n/v/d.  Denies change in vision, rhinorrhea, ear pain or sore throat.  She did have chicken pox when she was younger.  Past Medical History  Diagnosis Date  . Hypertension   . Hyperlipidemia   . Depression    Past Surgical History  Procedure Laterality Date  . Abdominal hysterectomy  2004   Family History  Problem Relation Age of Onset  . Breast cancer Maternal Aunt   . Heart disease Maternal Aunt    Social History  Substance Use Topics  . Smoking status: Never Smoker   . Smokeless tobacco: None  . Alcohol Use: No   OB History    No data available     Review of Systems  Constitutional: Negative for fever and chills.  HENT: Negative for congestion, ear pain, sore throat, trouble swallowing and voice change.   Respiratory: Negative for cough and shortness of breath.   Cardiovascular: Negative for chest pain and palpitations.  Gastrointestinal: Negative for nausea, vomiting, abdominal pain and diarrhea.  Musculoskeletal: Negative for myalgias, back pain and arthralgias.    Skin: Positive for rash. Negative for wound.  All other systems reviewed and are negative.   Allergies  Aspirin; Atorvastatin; Lisinopril; Losartan; and Rosuvastatin  Home Medications   Prior to Admission medications   Medication Sig Start Date End Date Taking? Authorizing Provider  atorvastatin (LIPITOR) 40 MG tablet Take 1 tablet (40 mg total) by mouth daily. 07/16/14   Hali Marry, MD  fluvoxaMINE (LUVOX) 100 MG tablet Take 1 tablet (100 mg total) by mouth daily. 07/27/14   Hali Marry, MD  hydrOXYzine (ATARAX/VISTARIL) 25 MG tablet Take 1 tablet (25 mg total) by mouth every 6 (six) hours. 05/15/15   Noland Fordyce, PA-C  predniSONE (DELTASONE) 20 MG tablet 3 tabs po day one, then 2 po daily x 4 days 05/15/15   Noland Fordyce, PA-C  valACYclovir (VALTREX) 1000 MG tablet Take 1 tablet (1,000 mg total) by mouth 3 (three) times daily. 05/15/15   Noland Fordyce, PA-C   Meds Ordered and Administered this Visit   Medications  dexamethasone (DECADRON) injection 10 mg (10 mg Intramuscular Given 05/15/15 1431)    Pulse 78  Temp(Src) 97.9 F (36.6 C)  Ht 5\' 2"  (1.575 m)  Wt 150 lb (68.04 kg)  BMI 27.43 kg/m2  SpO2 95% No data found.   Physical Exam  Constitutional: She appears well-developed and well-nourished. No distress.  HENT:  Head: Normocephalic and atraumatic.  Eyes: Conjunctivae are normal. No scleral icterus.  Neck: Normal  range of motion.  Cardiovascular: Normal rate, regular rhythm and normal heart sounds.   Pulmonary/Chest: Effort normal and breath sounds normal. No respiratory distress. She has no wheezes. She has no rales. She exhibits no tenderness.  Abdominal: Soft. Bowel sounds are normal. She exhibits no distension and no mass. There is no tenderness. There is no rebound and no guarding.  Musculoskeletal: Normal range of motion.  Neurological: She is alert.  Skin: Skin is warm and dry. Rash noted. She is not diaphoretic. There is erythema.      Diffuse erythematous maculopapular rash as diagramed   Nursing note and vitals reviewed.   ED Course  Procedures (including critical care time)  Labs Review Labs Reviewed - No data to display  Imaging Review No results found.   MDM   1. Pruritic erythematous rash   2. Rash and nonspecific skin eruption    Diffuse erythematous pruritic burning rash that started after pt had done yard work about 2 weeks ago. Rash keeps spreading.  Rash is concerning for possible shingles along abdomen and lower back as it is all on Left side of her body.  Rash may have started as contact dermatitis then caused shingles rash to start.  Will tx for both.  Tx in UC: decadron 10mg   Rx: valtrex, prednisone, atarax   Strongly encouraged to f/u with PCP this week for recheck of symptoms, especially if rash worsens on face. Patient verbalized understanding and agreement with treatment plan.   Noland Fordyce, PA-C 05/15/15 1752

## 2015-05-15 NOTE — ED Notes (Signed)
Pt c/o rash possible poison oak on face, scalp, arms, torso and fingers.  Pt was cleaning up her front yard and thinks she got into it there.

## 2015-05-16 ENCOUNTER — Telehealth: Payer: Self-pay | Admitting: Emergency Medicine

## 2015-05-16 NOTE — Telephone Encounter (Signed)
Okay to hold the  Valtrex if she is feeling better.

## 2015-05-16 NOTE — Telephone Encounter (Signed)
Patient was evaluated for rash yesterday in [facility]; would like to know if she really needs to start Valtrex since no longer painful? Shingles was a rule out. PAK

## 2015-05-23 ENCOUNTER — Ambulatory Visit (INDEPENDENT_AMBULATORY_CARE_PROVIDER_SITE_OTHER): Payer: BLUE CROSS/BLUE SHIELD | Admitting: Family Medicine

## 2015-05-23 ENCOUNTER — Encounter: Payer: Self-pay | Admitting: Family Medicine

## 2015-05-23 VITALS — BP 118/73 | HR 87 | Temp 98.2°F | Resp 18 | Ht 62.0 in | Wt 152.9 lb

## 2015-05-23 DIAGNOSIS — E785 Hyperlipidemia, unspecified: Secondary | ICD-10-CM

## 2015-05-23 DIAGNOSIS — I10 Essential (primary) hypertension: Secondary | ICD-10-CM | POA: Diagnosis not present

## 2015-05-23 DIAGNOSIS — L259 Unspecified contact dermatitis, unspecified cause: Secondary | ICD-10-CM

## 2015-05-23 DIAGNOSIS — F411 Generalized anxiety disorder: Secondary | ICD-10-CM

## 2015-05-23 LAB — COMPLETE METABOLIC PANEL WITH GFR
ALBUMIN: 4.1 g/dL (ref 3.6–5.1)
ALK PHOS: 77 U/L (ref 33–130)
ALT: 18 U/L (ref 6–29)
AST: 16 U/L (ref 10–35)
BILIRUBIN TOTAL: 0.3 mg/dL (ref 0.2–1.2)
BUN: 18 mg/dL (ref 7–25)
CALCIUM: 9 mg/dL (ref 8.6–10.4)
CO2: 29 mmol/L (ref 20–31)
Chloride: 105 mmol/L (ref 98–110)
Creat: 0.99 mg/dL (ref 0.50–1.05)
GFR, EST AFRICAN AMERICAN: 75 mL/min (ref >=60)
GFR, EST NON AFRICAN AMERICAN: 65 mL/min (ref >=60)
Glucose, Bld: 94 mg/dL (ref 65–99)
Potassium: 4.1 mmol/L (ref 3.5–5.3)
Sodium: 140 mmol/L (ref 135–146)
TOTAL PROTEIN: 6.8 g/dL (ref 6.1–8.1)

## 2015-05-23 LAB — LIPID PANEL
Cholesterol: 189 mg/dL (ref 125–200)
HDL: 54 mg/dL (ref >=46)
LDL Cholesterol: 97 mg/dL (ref <130)
TRIGLYCERIDES: 191 mg/dL — AB (ref <150)
Total CHOL/HDL Ratio: 3.5 Ratio (ref <=5.0)
VLDL: 38 mg/dL — ABNORMAL HIGH (ref <30)

## 2015-05-23 MED ORDER — PREDNISONE 20 MG PO TABS: ORAL_TABLET | ORAL | Status: DC

## 2015-05-23 MED ORDER — ALPRAZOLAM 0.5 MG PO TABS: ORAL_TABLET | ORAL | Status: DC

## 2015-05-23 MED ORDER — METOPROLOL SUCCINATE ER 25 MG PO TB24: 25.0000 mg | ORAL_TABLET | Freq: Every day | ORAL | Status: DC

## 2015-05-23 NOTE — Progress Notes (Signed)
   Subjective:    Patient ID: Tina Meyer, female    DOB: [birth year], 55 y.o.   MRN: [id number]  HPI She is here for f/u rash that started after working in the yard.  She is feeling better. She took the steroid and the hydroxyzine. She didn't take the Valtrex as she wasn't convinced that it was shingles and didn't want to take too many medications at one time. She is feeling a lot better. She still has some itching and it's worse after shower but overall feels like the rash is starting to fade. She has had bad reactions to poison ivy in the past.  Anxiety - she would like a refill on her alprazolam. She says she typically takes at bedtime as needed for sleep.  Hyperlipidemia-she's back on her statin taking it regularly. Due to repeat lipid levels.  Hypertension-she's still concerned that the losartan could be causing some hair loss and would like to try something else.  Review of Systems     Objective:   Physical Exam  Constitutional: She is oriented to person, place, and time. She appears well-developed and well-nourished.  HENT:  Head: Normocephalic and atraumatic.  Cardiovascular: Normal rate, regular rhythm and normal heart sounds.   Pulmonary/Chest: Effort normal and breath sounds normal.  Neurological: She is alert and oriented to person, place, and time.  Skin: Skin is warm and dry.  Psychiatric: She has a normal mood and affect. Her behavior is normal.          Assessment & Plan:  Contact dermatitis-seems to be improving. I did continue her steroids with a taper over the next 6 days. Call if not improving or suddenly worse especially after completing the prednisone. Next  Anxiety-read refill her alprazolam today. Follow-up in 6 months. Next  Hyperlipidemia-due to recheck lipid panel. Lab slip provided. Next  Hypertension-discontinue losartan and try metoprolol and its place. Recommend she follow up in 1-2 months for blood pressure check just to make sure that she  is getting to goal.

## 2015-06-19 ENCOUNTER — Other Ambulatory Visit: Payer: Self-pay | Admitting: Family Medicine

## 2015-07-16 ENCOUNTER — Other Ambulatory Visit: Payer: Self-pay | Admitting: Family Medicine

## 2015-07-19 ENCOUNTER — Encounter: Payer: Self-pay | Admitting: Family Medicine

## 2015-07-19 ENCOUNTER — Ambulatory Visit (INDEPENDENT_AMBULATORY_CARE_PROVIDER_SITE_OTHER): Payer: BLUE CROSS/BLUE SHIELD

## 2015-07-19 ENCOUNTER — Ambulatory Visit (INDEPENDENT_AMBULATORY_CARE_PROVIDER_SITE_OTHER): Payer: BLUE CROSS/BLUE SHIELD | Admitting: Family Medicine

## 2015-07-19 ENCOUNTER — Ambulatory Visit: Payer: BLUE CROSS/BLUE SHIELD | Admitting: Physician Assistant

## 2015-07-19 VITALS — BP 155/84 | HR 88 | Temp 99.5°F | Wt 150.0 lb

## 2015-07-19 DIAGNOSIS — R05 Cough: Secondary | ICD-10-CM

## 2015-07-19 DIAGNOSIS — R0989 Other specified symptoms and signs involving the circulatory and respiratory systems: Secondary | ICD-10-CM

## 2015-07-19 DIAGNOSIS — R059 Cough, unspecified: Secondary | ICD-10-CM | POA: Insufficient documentation

## 2015-07-19 MED ORDER — LEVOFLOXACIN 500 MG PO TABS: 500.0000 mg | ORAL_TABLET | Freq: Every day | ORAL | Status: DC

## 2015-07-19 MED ORDER — GUAIFENESIN-CODEINE 100-10 MG/5ML PO SOLN: 5.0000 mL | Freq: Every evening | ORAL | Status: DC | PRN

## 2015-07-19 MED ORDER — IPRATROPIUM BROMIDE 0.06 % NA SOLN: 2.0000 | NASAL | Status: DC | PRN

## 2015-07-19 NOTE — Assessment & Plan Note (Signed)
Cough possibly viral however symptoms are concerning for second sickening. Plan for x-ray and empiric treatment with Levaquin. Additionally use Atrovent nasal spray and codeine cough syrup. Murmur is stable. Follow-up with PCP.

## 2015-07-19 NOTE — Progress Notes (Signed)
       Tina Meyer is a 55 y.o. female who presents to [facility] today for cough. Patient notes a bothersome cough for the last week. She became acutely worse yesterday and developed subjective fevers body aches sore throat. She denies any severe chest pain or palpitations. No severe shortness of breath. No current vomiting or diarrhea. She's tried multiple over-the-counter medicines which are only been mildly helpful. Her symptoms are somewhat consistent with previous episodes of community-acquired pneumonia.   Past Medical History  Diagnosis Date  . Hypertension   . Hyperlipidemia   . Depression    Past Surgical History  Procedure Laterality Date  . Abdominal hysterectomy  2004   Social History  Substance Use Topics  . Smoking status: Never Smoker   . Smokeless tobacco: Not on file  . Alcohol Use: No   family history includes Breast cancer in her maternal aunt; Heart disease in her maternal aunt.  ROS as above Medications: Current Outpatient Prescriptions  Medication Sig Dispense Refill  . ALPRAZolam (XANAX) 0.5 MG tablet TAKE 1 TAB BY MOUTH AT BEDTIME AS NEEDED.  Try not to use every night 90 tablet 1  . atorvastatin (LIPITOR) 40 MG tablet TAKE 1 TABLET DAILY 90 tablet 2  . fluvoxaMINE (LUVOX) 100 MG tablet Take 1 tablet (100 mg total) by mouth daily. 90 tablet 3  . metoprolol succinate (TOPROL-XL) 25 MG 24 hr tablet Take 1 tablet (25 mg total) by mouth daily. 90 tablet 0  . guaiFENesin-codeine 100-10 MG/5ML syrup Take 5 mLs by mouth at bedtime as needed for cough. 120 mL 0  . ipratropium (ATROVENT) 0.06 % nasal spray Place 2 sprays into both nostrils every 4 (four) hours as needed for rhinitis. 10 mL 6  . levofloxacin (LEVAQUIN) 500 MG tablet Take 1 tablet (500 mg total) by mouth daily. 7 tablet 0   No current facility-administered medications for this visit.    Allergies  Allergen Reactions  . Aspirin     REACTION: nausea  . Lisinopril     REACTION: anaphalactic  . Losartan Other (See Comments)    Hair Loss  . Rosuvastatin     REACTION: myalgias     Exam:  BP 155/84 mmHg  Pulse 88  Temp(Src) 99.5 F (37.5 C) (Oral)  Wt 150 lb (68.04 kg)  SpO2 96% Gen: Well NAD Nontoxic HEENT: EOMI,  MMM clear nasal discharge. Posterior pharynx with cobblestoning. Normal tympanic membranes. Lungs: Normal work of breathing. CTABL Heart: RRR systolic murmur present nonradiating. Abd: NABS, Soft. Nondistended, Nontender Exts: Brisk capillary refill, warm and well perfused.   No results found for this or any previous visit (from the past 24 hour(s)). No results found.   Please see individual assessment and plan sections.

## 2015-07-19 NOTE — Patient Instructions (Signed)
Thank you for coming in today. Get xray .  Take levaquin and use nasal spray.  Use cough medicine as needed.  Call or go to the emergency room if you get worse, have trouble breathing, have chest pains, or palpitations.   Cough, Adult Coughing is a reflex that clears your throat and your airways. Coughing helps to heal and protect your lungs. It is normal to cough occasionally, but a cough that happens with other symptoms or lasts a long time may be a sign of a condition that needs treatment. A cough may last only 2-3 weeks (acute), or it may last longer than 8 weeks (chronic). CAUSES Coughing is commonly caused by:  Breathing in substances that irritate your lungs.  A viral or bacterial respiratory infection.  Allergies.  Asthma.  Postnasal drip.  Smoking.  Acid backing up from the stomach into the esophagus (gastroesophageal reflux).  Certain medicines.  Chronic lung problems, including COPD (or rarely, lung cancer).  Other medical conditions such as heart failure. HOME CARE INSTRUCTIONS  Pay attention to any changes in your symptoms. Take these actions to help with your discomfort:  Take medicines only as told by your health care provider.  If you were prescribed an antibiotic medicine, take it as told by your health care provider. Do not stop taking the antibiotic even if you start to feel better.  Talk with your health care provider before you take a cough suppressant medicine.  Drink enough fluid to keep your urine clear or pale yellow.  If the air is dry, use a cold steam vaporizer or humidifier in your bedroom or your home to help loosen secretions.  Avoid anything that causes you to cough at work or at home.  If your cough is worse at night, try sleeping in a semi-upright position.  Avoid cigarette smoke. If you smoke, quit smoking. If you need help quitting, ask your health care provider.  Avoid caffeine.  Avoid alcohol.  Rest as needed. SEEK MEDICAL  CARE IF:   You have new symptoms.  You cough up pus.  Your cough does not get better after 2-3 weeks, or your cough gets worse.  You cannot control your cough with suppressant medicines and you are losing sleep.  You develop pain that is getting worse or pain that is not controlled with pain medicines.  You have a fever.  You have unexplained weight loss.  You have night sweats. SEEK IMMEDIATE MEDICAL CARE IF:  You cough up blood.  You have difficulty breathing.  Your heartbeat is very fast.   This information is not intended to replace advice given to you by your health care provider. Make sure you discuss any questions you have with your health care provider.   Document Released: 09/29/2010 Document Revised: 12/22/2014 Document Reviewed: 06/09/2014 Elsevier Interactive Patient Education [employer].

## 2015-07-20 ENCOUNTER — Encounter: Payer: Self-pay | Admitting: Family Medicine

## 2015-07-20 ENCOUNTER — Telehealth: Payer: Self-pay

## 2015-07-20 NOTE — Telephone Encounter (Signed)
Letter written and placed in fax box

## 2015-07-20 NOTE — Telephone Encounter (Signed)
Patient would like a note for work today. She is still feeling sick and could not go in today. Please advise.    Fax note to [phone] Barrett Henle

## 2015-07-20 NOTE — Progress Notes (Signed)
Quick Note:  Normal, no changes. ______ 

## 2015-07-21 NOTE — Telephone Encounter (Signed)
Faxed note.

## 2015-07-22 ENCOUNTER — Telehealth: Payer: Self-pay

## 2015-07-22 MED ORDER — PREDNISONE 10 MG PO TABS: 30.0000 mg | ORAL_TABLET | Freq: Every day | ORAL | Status: DC

## 2015-07-22 NOTE — Telephone Encounter (Signed)
Tina Meyer complains of sore swollen glands and her cough is worse. She doesn't feel like she is getting better. She was advised to call back if not better. Please advise.

## 2015-07-22 NOTE — Telephone Encounter (Signed)
Patient's mom advised 

## 2015-07-22 NOTE — Telephone Encounter (Signed)
I have prescribed prednisone to take.  Return if not better.

## 2015-07-28 ENCOUNTER — Telehealth: Payer: Self-pay

## 2015-07-28 NOTE — Telephone Encounter (Signed)
This is a pt of Dr. Madilyn Fireman.  She reports that her bp have been 150/90 sometimes 150/100.  metheney put her on metoprolol 25 mg daily. She wants to know can she up this dose. Please advise.

## 2015-07-28 NOTE — Telephone Encounter (Signed)
Yes, increasing to 50mg  of metoprolol daily is reasonable. For now take two of the 25mg  and call in with new BPs next week.

## 2015-07-28 NOTE — Telephone Encounter (Signed)
Pt.notified

## 2015-08-17 ENCOUNTER — Telehealth: Payer: Self-pay | Admitting: Family Medicine

## 2015-08-17 MED ORDER — PREDNISONE 20 MG PO TABS: 40.0000 mg | ORAL_TABLET | Freq: Every day | ORAL | Status: DC

## 2015-08-17 NOTE — Telephone Encounter (Signed)
I will call her in her prednisone. But if she's not improving then she absolutely needs to come in and please let her know that we do normally require people to come in but we will call it in this time.

## 2015-08-17 NOTE — Telephone Encounter (Signed)
Pt called clinic today stating she has poison ivy and its spreading. It started on her arms, spread to her hands, and is now on her face. Pt reports it is on her eyelids, around her eyes, and around her mouth. Offered Pt an appointment today, unable to come. Pt reports she cannot get off work. Will route to PCP for review.

## 2015-08-17 NOTE — Telephone Encounter (Signed)
Pt notified of Rx and she was also advised if she does not start to see improvement to contact clinic.

## 2015-08-18 DIAGNOSIS — L255 Unspecified contact dermatitis due to plants, except food: Secondary | ICD-10-CM | POA: Diagnosis not present

## 2015-09-02 ENCOUNTER — Telehealth: Payer: Self-pay | Admitting: Family Medicine

## 2015-09-02 ENCOUNTER — Encounter: Payer: Self-pay | Admitting: Family Medicine

## 2015-09-02 ENCOUNTER — Ambulatory Visit (INDEPENDENT_AMBULATORY_CARE_PROVIDER_SITE_OTHER): Payer: BLUE CROSS/BLUE SHIELD | Admitting: Family Medicine

## 2015-09-02 VITALS — BP 117/62 | HR 94 | Wt 149.0 lb

## 2015-09-02 DIAGNOSIS — L237 Allergic contact dermatitis due to plants, except food: Secondary | ICD-10-CM

## 2015-09-02 DIAGNOSIS — I10 Essential (primary) hypertension: Secondary | ICD-10-CM

## 2015-09-02 MED ORDER — ALPRAZOLAM 0.5 MG PO TABS: ORAL_TABLET | ORAL | Status: DC

## 2015-09-02 MED ORDER — METOPROLOL SUCCINATE ER 50 MG PO TB24: 50.0000 mg | ORAL_TABLET | Freq: Every day | ORAL | Status: DC

## 2015-09-02 NOTE — Progress Notes (Signed)
   Subjective:    Patient ID: Tina Meyer, female    DOB: [birth year], 55 y.o.   MRN: [id number]  HPI Hypertension-I saw her about 3 months ago and we discontinued her losartan because of hair loss and switch her to metoprolol. After month it was not controlling her blood pressure well so she called and I told her to increase to 2. Unfortunately she ran out early so she went back to her old losartan. She says that when she was taking the tube tabs metoprolol her blood pressure did look great.  Poison ivy- she's still battling poison ivy. She was out of town and action caught about 2 weeks ago and caught her and some prednisone. She still has several places on her forearms and around her ankles. He is been using an over-the-counter cream.   Review of Systems     Objective:   Physical Exam  Constitutional: She is oriented to person, place, and time. She appears well-developed and well-nourished.  HENT:  Head: Normocephalic and atraumatic.  Cardiovascular: Normal rate, regular rhythm and normal heart sounds.   Pulmonary/Chest: Effort normal and breath sounds normal.  Neurological: She is alert and oriented to person, place, and time.  Skin: Skin is warm and dry.  Psychiatric: She has a normal mood and affect. Her behavior is normal.    She does have erythematous streaking rash on both forearms and over her left ankle and right lower leg.      Assessment & Plan:  Hypertension-will increase prescription to metoprolol 50 mg. She'll call back in a couple weeks and let us know for blood pressures look good. Otherwise I will see her back in August of September for a wellness exam. Continue current regimen.  Poison ivy - offered to give her another round of prednisone. Explained that sometimes poison ivy can last up to 4 weeks due to a delayed hypersensitivity reaction. She says she's just continue with her over-the-counter cream for now.

## 2015-09-02 NOTE — Telephone Encounter (Signed)
Pt contacted clinic regarding her BP Rx (metolrolol 25mg ). Pt states she was advised by PCP to contact clinic if her BP was not being controlled. Pt states her BP has been "running high" and questions if dosage should be increased. Attempted to return Pt call for values of home readings- no answer and VM full. Will route to PCP.

## 2015-09-02 NOTE — Telephone Encounter (Signed)
Pt returned clinic call and her BP is ranging from the 0000000 systolic. Pt does report she ran out of her metoprolol so she has been taking the old losartan Rx. Pt states she had contacted clinic a couple weeks ago and was advised to double the metoprolol Rx, so she started taking 50mg  daily. This caused her Rx to run out faster. Pt reports her BP was "good" then. Pt was added onto PCP schedule for today by schedulers.

## 2015-09-27 ENCOUNTER — Other Ambulatory Visit: Payer: Self-pay | Admitting: Family Medicine

## 2015-09-28 ENCOUNTER — Other Ambulatory Visit: Payer: Self-pay | Admitting: Family Medicine

## 2016-01-13 ENCOUNTER — Other Ambulatory Visit: Payer: Self-pay

## 2016-01-13 MED ORDER — METOPROLOL SUCCINATE ER 50 MG PO TB24: 50.0000 mg | ORAL_TABLET | Freq: Every day | ORAL | 1 refills | Status: DC

## 2016-01-30 ENCOUNTER — Encounter: Payer: Self-pay | Admitting: Family Medicine

## 2016-01-30 ENCOUNTER — Ambulatory Visit (INDEPENDENT_AMBULATORY_CARE_PROVIDER_SITE_OTHER): Payer: BLUE CROSS/BLUE SHIELD | Admitting: Family Medicine

## 2016-01-30 VITALS — BP 143/74 | HR 63 | Ht 62.0 in | Wt 155.0 lb

## 2016-01-30 DIAGNOSIS — I700 Atherosclerosis of aorta: Secondary | ICD-10-CM

## 2016-01-30 DIAGNOSIS — Z0000 Encounter for general adult medical examination without abnormal findings: Secondary | ICD-10-CM | POA: Diagnosis not present

## 2016-01-30 DIAGNOSIS — I10 Essential (primary) hypertension: Secondary | ICD-10-CM | POA: Diagnosis not present

## 2016-01-30 DIAGNOSIS — N760 Acute vaginitis: Secondary | ICD-10-CM | POA: Diagnosis not present

## 2016-01-30 DIAGNOSIS — IMO0001 Reserved for inherently not codable concepts without codable children: Secondary | ICD-10-CM

## 2016-01-30 DIAGNOSIS — Z23 Encounter for immunization: Secondary | ICD-10-CM

## 2016-01-30 LAB — WET PREP FOR TRICH, YEAST, CLUE
Trich, Wet Prep: NONE SEEN
Yeast Wet Prep HPF POC: NONE SEEN

## 2016-01-30 MED ORDER — ESTRADIOL 0.025 MG/24HR TD PTTW: 1.0000 | MEDICATED_PATCH | TRANSDERMAL | 5 refills | Status: DC

## 2016-01-30 MED ORDER — METRONIDAZOLE 500 MG PO TABS: 500.0000 mg | ORAL_TABLET | Freq: Two times a day (BID) | ORAL | 0 refills | Status: DC

## 2016-01-30 MED ORDER — VALSARTAN-HYDROCHLOROTHIAZIDE 80-12.5 MG PO TABS: 1.0000 | ORAL_TABLET | Freq: Every day | ORAL | 5 refills | Status: DC

## 2016-01-30 NOTE — Progress Notes (Signed)
Subjective:     Tina Meyer is a 55 y.o. female and is here for a comprehensive physical exam. The patient reports problems - she had a cyst at the opening of the vagina that she noticed about 2 months ago. She says it finally got extremely large and then broke open one evening. She says it drained for several days but seemed to completely heal. But ever since then she's had a lot of vaginal itching and irritation. she is postmenopausal also gets a lot of vaginal dryness at baseline. She came off of her hormone therapy a little over a year ago and just feels like her hormones are all over the place.  Attention-she says she really just doesn't like how she feels on metoprolol. She said she took Diovan years ago and did well and wonders if she could switch back.  Social History   Social History  . Marital status: Widowed    Spouse name: N/A  . Number of children: 0  . Years of education: N/A   Occupational History  . dental office     [employer]   Social History Main Topics  . Smoking status: Never Smoker  . Smokeless tobacco: Not on file  . Alcohol use No  . Drug use: No  . Sexual activity: Not Currently   Other Topics Concern  . Not on file   Social History Narrative   Had dogs.  Occ exercise.  1 caffeine drink per days.     Health Maintenance  Topic Date Due  . COLONOSCOPY  07/30/2010  . INFLUENZA VACCINE  11/15/2015  . MAMMOGRAM  01/14/2016  . TETANUS/TDAP  10/11/2022  . Hepatitis C Screening  Completed  . HIV Screening  Completed    The following portions of the patient's history were reviewed and updated as appropriate: allergies, current medications, past family history, past medical history, past social history, past surgical history and problem list.  Review of Systems A comprehensive review of systems was negative.   Objective:    BP (!) 143/74   Pulse 63   Ht 5\' 2"  (1.575 m)   Wt 155 lb (70.3 kg)   BMI 28.35 kg/m   General  appearance: alert, cooperative and appears stated age Head: Normocephalic, without obvious abnormality, atraumatic Eyes: conj clear, EOMi, PEERLA Ears: normal TM's and external ear canals both ears Nose: Nares normal. Septum midline. Mucosa normal. No drainage or sinus tenderness. Throat: lips, mucosa, and tongue normal; teeth and gums normal Neck: no adenopathy, no carotid bruit, no JVD, supple, symmetrical, trachea midline and thyroid not enlarged, symmetric, no tenderness/mass/nodules Back: symmetric, no curvature. ROM normal. No CVA tenderness. Lungs: clear to auscultation bilaterally Breasts: normal appearance, no masses or tenderness Heart: regular rate and rhythm, S1, S2 normal, no murmur, click, rub or gallop Abdomen: soft, non-tender; bowel sounds normal; no masses,  no organomegaly Pelvic: external genitalia normal, rectovaginal septum normal and vagina normal without discharge Extremities: extremities normal, atraumatic, no cyanosis or edema Pulses: 2+ and symmetric Skin: Skin color, texture, turgor normal. No rashes or lesions Lymph nodes: Cervical, supraclavicular, and axillary nodes normal. Neurologic: Alert and oriented X 3, normal strength and tone. Normal symmetric reflexes. Normal coordination and gait    Assessment:    Healthy female exam.      Plan:     See After Visit Summary for Counseling Recommendations  Keep up a regular exercise program and make sure you are eating a healthy diet Try to  eat 4 servings of dairy a day, or if you are lactose intolerant take a calcium with vitamin D daily.  Your vaccines are up to date.   Post menopausal symptoms- restart viville for 6 months.  Vaginitis-wet prep sent for further evaluation. Next  Hypertension-discontinue metoprolol and switch to Diovan HCTZ 80/12.5 which is what she was on previously. Follow-up in 6 weeks for blood pressure check.

## 2016-01-30 NOTE — Addendum Note (Signed)
Addended by: Beatrice Lecher D on: 01/30/2016 09:49 PM   Modules accepted: Orders

## 2016-01-30 NOTE — Patient Instructions (Signed)
Keep up a regular exercise program and make sure you are eating a healthy diet Try to eat 4 servings of dairy a day, or if you are lactose intolerant take a calcium with vitamin D daily.  Your vaccines are up to date.   

## 2016-02-01 ENCOUNTER — Telehealth: Payer: Self-pay

## 2016-02-01 NOTE — Telephone Encounter (Signed)
Pt understands that she have BV.  She wants to know could this come from vaginal dryness? And how can she prevent this in the future. Please advise.

## 2016-02-01 NOTE — Telephone Encounter (Signed)
It was probably from recent antibiotic use.  Sometimes taking an oral probiotic can help.

## 2016-02-01 NOTE — Telephone Encounter (Signed)
Attempted to contact Pt regarding recommendation, no answer and no VM

## 2016-02-17 ENCOUNTER — Emergency Department
Admission: EM | Admit: 2016-02-17 | Discharge: 2016-02-17 | Disposition: A | Discharge status: Home / Self Care | Payer: BLUE CROSS/BLUE SHIELD | Source: Home / Self Care | Attending: Family Medicine | Admitting: Family Medicine

## 2016-02-17 DIAGNOSIS — J069 Acute upper respiratory infection, unspecified: Secondary | ICD-10-CM

## 2016-02-17 DIAGNOSIS — B9689 Other specified bacterial agents as the cause of diseases classified elsewhere: Secondary | ICD-10-CM | POA: Diagnosis not present

## 2016-02-17 DIAGNOSIS — J019 Acute sinusitis, unspecified: Secondary | ICD-10-CM | POA: Diagnosis not present

## 2016-02-17 MED ORDER — PROMETHAZINE-CODEINE 6.25-10 MG/5ML PO SYRP: 5.0000 mL | ORAL_SOLUTION | Freq: Four times a day (QID) | ORAL | 0 refills | Status: DC | PRN

## 2016-02-17 MED ORDER — FLUTICASONE PROPIONATE 50 MCG/ACT NA SUSP: 2.0000 | Freq: Every day | NASAL | 2 refills | Status: DC

## 2016-02-17 MED ORDER — AMOXICILLIN-POT CLAVULANATE 875-125 MG PO TABS: 1.0000 | ORAL_TABLET | Freq: Two times a day (BID) | ORAL | 0 refills | Status: DC

## 2016-02-17 NOTE — Discharge Instructions (Signed)
°  Promethazine-codeine is a cough medication that can cause drowsiness. Do not drink alcohol or drive while taking.  You may want to eat a small snack or take with dinner to help prevent nausea.

## 2016-02-17 NOTE — ED Triage Notes (Signed)
Pt stated that she started with nausea and sore throat on 10/25.  On Friday,she started with sneezing, coughing, runny nose.  Saturday afternoon had a fever.  This week, she has felt better then the next day worse.  Is coughing up and blowing nose with yellowish/greenish drainage.  Is having facial pain.

## 2016-02-17 NOTE — ED Provider Notes (Signed)
CSN: [id number]     Arrival date & time 02/17/16  1558 History   First MD Initiated Contact with Patient 02/17/16 1642     Chief Complaint  Patient presents with  . Facial Pain   (Consider location/radiation/quality/duration/timing/severity/associated sxs/prior Treatment) HPI  DENEANE TETTER is a 55 y.o. female presenting to UC with c/o gradually worsening sinus and facial pain with sneezing, cough, and rhinorrhea.  She notes symptoms started initially on 10/25 with nausea and a sore throat.  She is now coughing up and blowing out yellow/green discharge. She did have a fever of 100.9*F the other week but none this week.  Denies n/v/d. She has taken OTC medications with mild relief.    Past Medical History:  Diagnosis Date  . Depression   . Hyperlipidemia   . Hypertension    Past Surgical History:  Procedure Laterality Date  . ABDOMINAL HYSTERECTOMY  2004   Family History  Problem Relation Age of Onset  . Breast cancer Maternal Aunt   . Heart disease Maternal Aunt    Social History  Substance Use Topics  . Smoking status: Never Smoker  . Smokeless tobacco: Not on file  . Alcohol use No   OB History    No data available     Review of Systems  Constitutional: Negative for chills and fever.  HENT: Positive for congestion, rhinorrhea, sinus pressure and sore throat. Negative for ear pain, trouble swallowing and voice change.   Respiratory: Positive for cough. Negative for shortness of breath.   Cardiovascular: Negative for chest pain and palpitations.  Gastrointestinal: Negative for abdominal pain, diarrhea, nausea and vomiting.  Musculoskeletal: Negative for arthralgias, back pain and myalgias.  Skin: Negative for rash.  Neurological: Positive for headaches. Negative for dizziness and light-headedness.    Allergies  Aspirin; Lisinopril; Losartan; and Rosuvastatin  Home Medications   Prior to Admission medications   Medication Sig Start Date End Date Taking?  Authorizing Provider  ALPRAZolam (XANAX) 0.5 MG tablet TAKE 1 TAB BY MOUTH AT BEDTIME AS NEEDED.  Try not to use every night 09/02/15   Hali Marry, MD  amoxicillin-clavulanate (AUGMENTIN) 875-125 MG tablet Take 1 tablet by mouth 2 (two) times daily. One po bid x 7 days 02/17/16   Noland Fordyce, PA-C  atorvastatin (LIPITOR) 40 MG tablet TAKE 1 TABLET DAILY 07/18/15   Hali Marry, MD  estradiol (VIVELLE-DOT) 0.025 MG/24HR Place 1 patch onto the skin 2 (two) times a week. 01/30/16   Hali Marry, MD  fluticasone (FLONASE) 50 MCG/ACT nasal spray Place 2 sprays into both nostrils daily. 02/17/16   Noland Fordyce, PA-C  fluvoxaMINE (LUVOX) 100 MG tablet TAKE 1 TABLET DAILY 09/27/15   Hali Marry, MD  ipratropium (ATROVENT) 0.06 % nasal spray Place 2 sprays into both nostrils every 4 (four) hours as needed for rhinitis. 07/19/15   Gregor Hams, MD  metroNIDAZOLE (FLAGYL) 500 MG tablet Take 1 tablet (500 mg total) by mouth 2 (two) times daily. 01/30/16   Hali Marry, MD  promethazine-codeine (PHENERGAN WITH CODEINE) 6.25-10 MG/5ML syrup Take 5 mLs by mouth every 6 (six) hours as needed for cough. 02/17/16   Noland Fordyce, PA-C  valsartan-hydrochlorothiazide (DIOVAN HCT) 80-12.5 MG tablet Take 1 tablet by mouth daily. 01/30/16   Hali Marry, MD   Meds Ordered and Administered this Visit  Medications - No data to display  BP 136/89 (BP Location: Left Arm)   Pulse 92   Temp 97.9 F (36.6  C) (Oral)   Ht 5\' 2"  (1.575 m)   Wt 154 lb 1.9 oz (69.9 kg)   SpO2 96%   BMI 28.19 kg/m  No data found.   Physical Exam  Constitutional: She is oriented to person, place, and time. She appears well-developed and well-nourished. No distress.  HENT:  Head: Normocephalic and atraumatic.  Right Ear: Tympanic membrane normal.  Left Ear: Tympanic membrane normal.  Nose: Mucosal edema present. Right sinus exhibits maxillary sinus tenderness and frontal sinus tenderness. Left  sinus exhibits maxillary sinus tenderness and frontal sinus tenderness.  Mouth/Throat: Uvula is midline, oropharynx is clear and moist and mucous membranes are normal.  Eyes: EOM are normal.  Neck: Normal range of motion. Neck supple.  Cardiovascular: Normal rate and regular rhythm.   Pulmonary/Chest: Effort normal and breath sounds normal. No respiratory distress. She has no wheezes. She has no rales.  Abdominal: Soft. She exhibits no distension. There is no tenderness.  Musculoskeletal: Normal range of motion.  Neurological: She is alert and oriented to person, place, and time.  Skin: Skin is warm and dry. She is not diaphoretic.  Psychiatric: She has a normal mood and affect. Her behavior is normal.  Nursing note and vitals reviewed.   Urgent Care Course   Clinical Course    Procedures (including critical care time)  Labs Review Labs Reviewed - No data to display  Imaging Review No results found.   MDM   1. Acute bacterial rhinosinusitis   2. Upper respiratory tract infection, unspecified type    Pt c/o worsening sinus congestion and facial pain. Worsening URI symptoms.   Sinus tenderness on exam.  Rx: Augmentin, Flonase and promethazine-codeine for cough at night. Encouraged f/u with PCP next week if not improving. Patient verbalized understanding and agreement with treatment plan.    Noland Fordyce, PA-C 02/17/16 1956

## 2016-03-27 ENCOUNTER — Other Ambulatory Visit: Payer: Self-pay | Admitting: Family Medicine

## 2016-04-13 ENCOUNTER — Other Ambulatory Visit: Payer: Self-pay | Admitting: Family Medicine

## 2016-04-25 ENCOUNTER — Other Ambulatory Visit: Payer: Self-pay | Admitting: *Deleted

## 2016-04-25 MED ORDER — ALPRAZOLAM 0.5 MG PO TABS: ORAL_TABLET | ORAL | 1 refills | Status: DC

## 2016-04-26 ENCOUNTER — Other Ambulatory Visit: Payer: Self-pay | Admitting: *Deleted

## 2016-04-26 MED ORDER — TRAZODONE HCL 50 MG PO TABS: 25.0000 mg | ORAL_TABLET | Freq: Every day | ORAL | 3 refills | Status: DC

## 2016-06-22 ENCOUNTER — Telehealth: Payer: Self-pay

## 2016-06-22 NOTE — Telephone Encounter (Signed)
Pt is sending a medical evaluation form for you to fill out for her to start adoption process. She had a PE in October, can you fill it out from this?  Or does she need an appointment for one?

## 2016-06-23 NOTE — Telephone Encounter (Signed)
Let me look over the form and see if I have all the info needed from the PE.  I will let her know if we need any additional info.

## 2016-06-25 ENCOUNTER — Other Ambulatory Visit: Payer: Self-pay | Admitting: Family Medicine

## 2016-06-25 NOTE — Telephone Encounter (Signed)
Left information on Pt's VM.

## 2016-07-07 ENCOUNTER — Other Ambulatory Visit: Payer: Self-pay | Admitting: Family Medicine

## 2016-07-12 ENCOUNTER — Other Ambulatory Visit: Payer: Self-pay | Admitting: Family Medicine

## 2016-08-04 ENCOUNTER — Other Ambulatory Visit: Payer: Self-pay | Admitting: Family Medicine

## 2016-09-02 ENCOUNTER — Other Ambulatory Visit: Payer: Self-pay | Admitting: Family Medicine

## 2016-09-05 ENCOUNTER — Ambulatory Visit (INDEPENDENT_AMBULATORY_CARE_PROVIDER_SITE_OTHER): Payer: BLUE CROSS/BLUE SHIELD | Admitting: Family Medicine

## 2016-09-05 ENCOUNTER — Encounter: Payer: Self-pay | Admitting: Family Medicine

## 2016-09-05 VITALS — BP 129/71 | HR 88 | Ht 62.0 in | Wt 151.0 lb

## 2016-09-05 DIAGNOSIS — I700 Atherosclerosis of aorta: Secondary | ICD-10-CM | POA: Diagnosis not present

## 2016-09-05 DIAGNOSIS — I10 Essential (primary) hypertension: Secondary | ICD-10-CM | POA: Diagnosis not present

## 2016-09-05 DIAGNOSIS — E785 Hyperlipidemia, unspecified: Secondary | ICD-10-CM | POA: Diagnosis not present

## 2016-09-05 DIAGNOSIS — IMO0001 Reserved for inherently not codable concepts without codable children: Secondary | ICD-10-CM

## 2016-09-05 DIAGNOSIS — F341 Dysthymic disorder: Secondary | ICD-10-CM

## 2016-09-05 DIAGNOSIS — Z1231 Encounter for screening mammogram for malignant neoplasm of breast: Secondary | ICD-10-CM

## 2016-09-05 MED ORDER — ALPRAZOLAM 0.5 MG PO TABS: ORAL_TABLET | ORAL | 0 refills | Status: DC

## 2016-09-05 MED ORDER — ATORVASTATIN CALCIUM 40 MG PO TABS: 40.0000 mg | ORAL_TABLET | Freq: Every day | ORAL | 3 refills | Status: DC

## 2016-09-05 MED ORDER — FLUTICASONE PROPIONATE 50 MCG/ACT NA SUSP: 2.0000 | Freq: Every day | NASAL | 2 refills | Status: DC

## 2016-09-05 MED ORDER — VALSARTAN-HYDROCHLOROTHIAZIDE 80-12.5 MG PO TABS: 1.0000 | ORAL_TABLET | Freq: Every day | ORAL | 1 refills | Status: DC

## 2016-09-05 NOTE — Progress Notes (Signed)
Subjective:    Patient ID: Tina Meyer, female    DOB: 11-Oct-1960, 56 y.o.   MRN: [id number]  HPI Hypertension- Pt denies chest pain, SOB, dizziness, or heart palpitations.  Taking meds as directed w/o problems.  Denies medication side effects.    Follow-up anxiety depression-currently on Luvox and Xanax as needed.He feels like this is going well. She's happy with her regimen and is not interested in weaning at this point but says she might be when she sees me back in 6 months. She feels like she sleeping well.  Hyperlipidemia-currently on Lipitor 40 mg daily. Tolerating well without any side effects or problems.   Review of Systems   BP 129/71   Pulse 88   Ht 5\' 2"  (1.575 m)   Wt 151 lb (68.5 kg)   SpO2 97%   BMI 27.62 kg/m     Allergies  Allergen Reactions  . Aspirin     REACTION: nausea  . Lisinopril     REACTION: anaphalactic  . Losartan Other (See Comments)    Hair Loss  . Rosuvastatin     REACTION: myalgias    Past Medical History:  Diagnosis Date  . Depression   . Hyperlipidemia   . Hypertension     Past Surgical History:  Procedure Laterality Date  . ABDOMINAL HYSTERECTOMY  2004    Social History   Social History  . Marital status: Widowed    Spouse name: N/A  . Number of children: 0  . Years of education: N/A   Occupational History  . dental office     [employer]   Social History Main Topics  . Smoking status: Never Smoker  . Smokeless tobacco: Never Used  . Alcohol use No  . Drug use: No  . Sexual activity: Not Currently   Other Topics Concern  . Not on file   Social History Narrative   Had dogs.  Occ exercise.  1 caffeine drink per days.      Family History  Problem Relation Age of Onset  . Breast cancer Maternal Aunt   . Heart disease Maternal Aunt     Outpatient Encounter Prescriptions as of 09/05/2016  Medication Sig  . ALPRAZolam (XANAX) 0.5 MG tablet TAKE 1 TAB BY MOUTH AT BEDTIME AS NEEDED.   Try not to use every night  . atorvastatin (LIPITOR) 40 MG tablet Take 1 tablet (40 mg total) by mouth daily.  Marland Kitchen estradiol (VIVELLE-DOT) 0.025 MG/24HR Place 1 patch onto the skin 2 (two) times a week.  . fluticasone (FLONASE) 50 MCG/ACT nasal spray Place 2 sprays into both nostrils daily.  . fluvoxaMINE (LUVOX) 100 MG tablet TAKE 1 TABLET DAILY  . ipratropium (ATROVENT) 0.06 % nasal spray Place 2 sprays into both nostrils every 4 (four) hours as needed for rhinitis.  Marland Kitchen traZODone (DESYREL) 50 MG tablet Take 0.5-1 tablets (25-50 mg total) by mouth daily.  . valsartan-hydrochlorothiazide (DIOVAN-HCT) 80-12.5 MG tablet Take 1 tablet by mouth daily.  . [DISCONTINUED] ALPRAZolam (XANAX) 0.5 MG tablet TAKE 1 TAB BY MOUTH AT BEDTIME AS NEEDED.  Try not to use every night  . [DISCONTINUED] atorvastatin (LIPITOR) 40 MG tablet Take 1 tablet (40 mg total) by mouth daily. NEED LAB WORK  . [DISCONTINUED] fluticasone (FLONASE) 50 MCG/ACT nasal spray Place 2 sprays into both nostrils daily.  . [DISCONTINUED] valsartan-hydrochlorothiazide (DIOVAN-HCT) 80-12.5 MG tablet TAKE 1 TABLET BY MOUTH DAILY. APPOINTMENT NEEDED FOR FURTHER REFILLS   No facility-administered encounter medications on  file as of 09/05/2016.           Objective:   Physical Exam  Constitutional: She is oriented to person, place, and time. She appears well-developed and well-nourished.  HENT:  Head: Normocephalic and atraumatic.  Cardiovascular: Normal rate and regular rhythm.   Murmur heard. 2/6 systolic ejection murmur best heard at the left sternal border.  Pulmonary/Chest: Effort normal and breath sounds normal.  Neurological: She is alert and oriented to person, place, and time.  Skin: Skin is warm and dry.  Psychiatric: She has a normal mood and affect. Her behavior is normal.          Assessment & Plan:  Anxiety/depression - Well controlled. PHQ 9 score of 0 and GAD 7 score of 1. Rates symptoms as not difficult. Continue  current regimen. Follow up in 6 months.   Hypertension-well-controlled. Continue current regimen. Due for CMP and lipid panel.  Hyperlipemia-due to recheck lipid and liver panel.  Mild aortic sclerosis - murmur still present on the exam. Last echocardiogram was 3 years ago. Consider repeating sometime this year.  Mammogram order placed today.

## 2016-10-02 ENCOUNTER — Other Ambulatory Visit: Payer: Self-pay | Admitting: Family Medicine

## 2016-10-21 ENCOUNTER — Other Ambulatory Visit: Payer: Self-pay | Admitting: Family Medicine

## 2016-12-05 ENCOUNTER — Other Ambulatory Visit: Payer: Self-pay | Admitting: Family Medicine

## 2016-12-24 ENCOUNTER — Other Ambulatory Visit: Payer: Self-pay | Admitting: Family Medicine

## 2017-01-30 ENCOUNTER — Other Ambulatory Visit: Payer: Self-pay | Admitting: Family Medicine

## 2017-01-31 ENCOUNTER — Other Ambulatory Visit: Payer: Self-pay

## 2017-01-31 MED ORDER — ALPRAZOLAM 0.5 MG PO TABS: 0.5000 mg | ORAL_TABLET | Freq: Every day | ORAL | 0 refills | Status: DC

## 2017-02-06 ENCOUNTER — Other Ambulatory Visit: Payer: Self-pay | Admitting: *Deleted

## 2017-02-06 MED ORDER — TRAZODONE HCL 50 MG PO TABS: 25.0000 mg | ORAL_TABLET | Freq: Every day | ORAL | 3 refills | Status: DC

## 2017-02-11 ENCOUNTER — Encounter: Payer: BLUE CROSS/BLUE SHIELD | Admitting: Family Medicine

## 2017-02-14 ENCOUNTER — Other Ambulatory Visit: Payer: Self-pay | Admitting: Family Medicine

## 2017-02-14 MED ORDER — ALPRAZOLAM 0.5 MG PO TABS: 0.5000 mg | ORAL_TABLET | Freq: Every day | ORAL | 0 refills | Status: DC

## 2017-02-14 NOTE — Progress Notes (Signed)
Faxed new 90 day rx to her mail order pt has appt on Monday.

## 2017-02-15 ENCOUNTER — Ambulatory Visit (INDEPENDENT_AMBULATORY_CARE_PROVIDER_SITE_OTHER): Payer: BLUE CROSS/BLUE SHIELD

## 2017-02-15 ENCOUNTER — Encounter: Payer: Self-pay | Admitting: Family Medicine

## 2017-02-15 ENCOUNTER — Ambulatory Visit (INDEPENDENT_AMBULATORY_CARE_PROVIDER_SITE_OTHER): Payer: BLUE CROSS/BLUE SHIELD | Admitting: Family Medicine

## 2017-02-15 VITALS — BP 129/71 | HR 75 | Ht 62.0 in | Wt 160.0 lb

## 2017-02-15 DIAGNOSIS — Z0000 Encounter for general adult medical examination without abnormal findings: Secondary | ICD-10-CM | POA: Diagnosis not present

## 2017-02-15 DIAGNOSIS — Z1231 Encounter for screening mammogram for malignant neoplasm of breast: Secondary | ICD-10-CM

## 2017-02-15 MED ORDER — VALSARTAN-HYDROCHLOROTHIAZIDE 160-12.5 MG PO TABS: 1.0000 | ORAL_TABLET | Freq: Every day | ORAL | 1 refills | Status: DC

## 2017-02-15 NOTE — Patient Instructions (Addendum)

## 2017-02-15 NOTE — Progress Notes (Signed)
Subjective:     Tina Meyer is a 56 y.o. female and is here for a comprehensive physical exam. The patient reports no problems.  Social History   Social History  . Marital status: Widowed    Spouse name: N/A  . Number of children: 0  . Years of education: N/A   Occupational History  . dental office     [employer]   Social History Main Topics  . Smoking status: Never Smoker  . Smokeless tobacco: Never Used  . Alcohol use No  . Drug use: No  . Sexual activity: Not Currently   Other Topics Concern  . Not on file   Social History Narrative   Had dogs.  Occ exercise.  1 caffeine drink per days.     Health Maintenance  Topic Date Due  . COLONOSCOPY  07/30/2010  . MAMMOGRAM  01/14/2016  . INFLUENZA VACCINE  11/14/2016  . TETANUS/TDAP  10/11/2022  . Hepatitis C Screening  Completed  . HIV Screening  Completed    The following portions of the patient's history were reviewed and updated as appropriate: allergies, current medications, past family history, past medical history, past social history, past surgical history and problem list.  Review of Systems A comprehensive review of systems was negative.   Objective:    BP 129/71   Pulse 75   Ht 5\' 2"  (1.575 m)   Wt 160 lb (72.6 kg)   SpO2 97%   BMI 29.26 kg/m  General appearance: alert, cooperative and appears stated age Head: Normocephalic, without obvious abnormality, atraumatic Eyes: conj clear, EOMI, PEERL Ears: normal TM's and external ear canals both ears Nose: Nares normal. Septum midline. Mucosa normal. No drainage or sinus tenderness. Throat: lips, mucosa, and tongue normal; teeth and gums normal Neck: no adenopathy, no carotid bruit, no JVD, supple, symmetrical, trachea midline and thyroid not enlarged, symmetric, no tenderness/mass/nodules Back: symmetric, no curvature. ROM normal. No CVA tenderness. Lungs: clear to auscultation bilaterally Breasts: normal appearance, no masses  or tenderness Heart: regular rate and rhythm, S1, S2 normal, no murmur, click, rub or gallop Abdomen: soft, non-tender; bowel sounds normal; no masses,  no organomegaly Extremities: extremities normal, atraumatic, no cyanosis or edema Pulses: 2+ and symmetric Skin: Skin color, texture, turgor normal. No rashes or lesions Lymph nodes: Cervical, supraclavicular, and axillary nodes normal. Neurologic: Alert and oriented X 3, normal strength and tone. Normal symmetric reflexes. Normal coordination and gait    Assessment:    Healthy female exam.      Plan:     See After Visit Summary for Counseling Recommendations   Keep up a regular exercise program and make sure you are eating a healthy diet Try to eat 4 servings of dairy a day, or if you are lactose intolerant take a calcium with vitamin D daily.  Your vaccines are up to date.  Will do mammo today.   [employer] form completed. We will wait for her phone call before he fax it to see for insurance will pay for.

## 2017-02-22 DIAGNOSIS — Z0000 Encounter for general adult medical examination without abnormal findings: Secondary | ICD-10-CM | POA: Diagnosis not present

## 2017-02-23 LAB — LIPID PANEL W/REFLEX DIRECT LDL
CHOL/HDL RATIO: 3.6 (calc) (ref <5.0)
Cholesterol: 204 mg/dL — ABNORMAL HIGH (ref <200)
HDL: 56 mg/dL (ref >50)
LDL CHOLESTEROL (CALC): 121 mg/dL — AB
NON-HDL CHOLESTEROL (CALC): 148 mg/dL — AB (ref <130)
TRIGLYCERIDES: 154 mg/dL — AB (ref <150)

## 2017-02-23 LAB — CBC
HEMATOCRIT: 39.3 % (ref 35.0–45.0)
HEMOGLOBIN: 13.4 g/dL (ref 11.7–15.5)
MCH: 27.1 pg (ref 27.0–33.0)
MCHC: 34.1 g/dL (ref 32.0–36.0)
MCV: 79.6 fL — AB (ref 80.0–100.0)
MPV: 10 fL (ref 7.5–12.5)
Platelets: 339 10*3/uL (ref 140–400)
RBC: 4.94 10*6/uL (ref 3.80–5.10)
RDW: 13.3 % (ref 11.0–15.0)
WBC: 6.6 10*3/uL (ref 3.8–10.8)

## 2017-02-23 LAB — COMPLETE METABOLIC PANEL WITH GFR
AG Ratio: 1.8 (calc) (ref 1.0–2.5)
ALBUMIN MSPROF: 4.7 g/dL (ref 3.6–5.1)
ALT: 20 U/L (ref 6–29)
AST: 23 U/L (ref 10–35)
Alkaline phosphatase (APISO): 83 U/L (ref 33–130)
BILIRUBIN TOTAL: 0.6 mg/dL (ref 0.2–1.2)
BUN / CREAT RATIO: 17 (calc) (ref 6–22)
BUN: 18 mg/dL (ref 7–25)
CALCIUM: 9.7 mg/dL (ref 8.6–10.4)
CHLORIDE: 104 mmol/L (ref 98–110)
CO2: 27 mmol/L (ref 20–32)
CREATININE: 1.07 mg/dL — AB (ref 0.50–1.05)
GFR, EST AFRICAN AMERICAN: 67 mL/min/{1.73_m2} (ref >=60)
GFR, EST NON AFRICAN AMERICAN: 58 mL/min/{1.73_m2} — AB (ref >=60)
GLUCOSE: 96 mg/dL (ref 65–99)
Globulin: 2.6 g/dL (calc) (ref 1.9–3.7)
Potassium: 3.9 mmol/L (ref 3.5–5.3)
Sodium: 142 mmol/L (ref 135–146)
TOTAL PROTEIN: 7.3 g/dL (ref 6.1–8.1)

## 2017-02-23 LAB — TSH: TSH: 2.27 m[IU]/L (ref 0.40–4.50)

## 2017-04-10 ENCOUNTER — Other Ambulatory Visit: Payer: Self-pay | Admitting: Family Medicine

## 2017-05-06 ENCOUNTER — Telehealth: Payer: Self-pay | Admitting: Family Medicine

## 2017-05-06 MED ORDER — CANDESARTAN CILEXETIL-HCTZ 16-12.5 MG PO TABS: 1.0000 | ORAL_TABLET | Freq: Every day | ORAL | 0 refills | Status: DC

## 2017-05-06 NOTE — Telephone Encounter (Signed)
Left message asking that pt come in for a nurse visit for BP check after being on the new medication after a couple of wks. She will need to call an make an appt.Tina Meyer

## 2017-05-06 NOTE — Telephone Encounter (Signed)
Call patient: I just received notification from her pharmacy benefit that since she is taking the valsartan HCTZ it has been recalled.  They are requesting that we send over substitution.  Please  let me know if she has a preference and if she has a preference for pharmacy.

## 2017-05-06 NOTE — Telephone Encounter (Signed)
Called pt and informed her of medication change. She is ok with change send to [facility].Tina Meyer [place]

## 2017-06-04 ENCOUNTER — Telehealth: Payer: Self-pay | Admitting: *Deleted

## 2017-06-04 NOTE — Telephone Encounter (Signed)
Medical evaluation form has been completed. Form copied and scanned. Attempted to call pt unable to reach due to vm being full.. Will place up front for p/u.Tina Meyer, [place]

## 2017-06-07 NOTE — Telephone Encounter (Signed)
Pt advised.

## 2017-07-05 ENCOUNTER — Other Ambulatory Visit: Payer: Self-pay | Admitting: *Deleted

## 2017-07-05 MED ORDER — ALPRAZOLAM 0.5 MG PO TABS: 0.5000 mg | ORAL_TABLET | Freq: Every day | ORAL | 0 refills | Status: DC

## 2017-07-05 NOTE — Telephone Encounter (Signed)
Routed to pcp for signature.Kadisha Goodine [place], CMA  

## 2017-07-06 ENCOUNTER — Other Ambulatory Visit: Payer: Self-pay | Admitting: Family Medicine

## 2017-08-05 ENCOUNTER — Telehealth: Payer: Self-pay | Admitting: Family Medicine

## 2017-08-05 MED ORDER — CANDESARTAN CILEXETIL-HCTZ 16-12.5 MG PO TABS: 1.0000 | ORAL_TABLET | Freq: Every day | ORAL | 0 refills | Status: DC

## 2017-08-05 NOTE — Telephone Encounter (Signed)
10 day RX sent to [facility].   Pt aware and states she will be keeping her upcoming appt.

## 2017-08-05 NOTE — Telephone Encounter (Signed)
Pt has appointment scheduled w/ Dr. Madilyn Fireman on Friday May 3rd but will be out of her bp meds this week. Can she get a small refill to last her to her appointment. Thanks

## 2017-08-16 ENCOUNTER — Encounter: Payer: Self-pay | Admitting: Family Medicine

## 2017-08-16 ENCOUNTER — Ambulatory Visit: Payer: BLUE CROSS/BLUE SHIELD | Admitting: Family Medicine

## 2017-08-16 VITALS — BP 133/69 | HR 85 | Ht 62.0 in | Wt 157.0 lb

## 2017-08-16 DIAGNOSIS — I10 Essential (primary) hypertension: Secondary | ICD-10-CM

## 2017-08-16 DIAGNOSIS — F341 Dysthymic disorder: Secondary | ICD-10-CM

## 2017-08-16 DIAGNOSIS — H6121 Impacted cerumen, right ear: Secondary | ICD-10-CM | POA: Diagnosis not present

## 2017-08-16 MED ORDER — AZELASTINE HCL 0.1 % NA SOLN: 2.0000 | Freq: Two times a day (BID) | NASAL | 3 refills | Status: DC

## 2017-08-16 MED ORDER — CANDESARTAN CILEXETIL-HCTZ 16-12.5 MG PO TABS: 1.0000 | ORAL_TABLET | Freq: Every day | ORAL | 1 refills | Status: DC

## 2017-08-16 NOTE — Progress Notes (Signed)
Subjective:    CC: BP, Mood 6 mo f/u   HPI:  Hypertension- Pt denies chest pain, SOB, dizziness, or heart palpitations.  Taking meds as directed w/o problems.  Denies medication side effects.  She has been on the candesartan since January.  We had to switch her from valsartan because of a medication recall.  Fortunately she actually feels much better on the new medication and is very pleased with it.  F/U anxiety/depression -she is currently on Luvox 100 mg daily.  She uses Xanax as needed.  She is me to look in her right ear today.  She still feels like it is full of wax.  Occasionally it will get painful and then pop.  But it does bother her at times. - Past medical history, Surgical history, Family history not pertinant except as noted below, Social history, Allergies, and medications have been entered into the medical record, reviewed, and corrections made.   Review of Systems: No fevers, chills, night sweats, weight loss, chest pain, or shortness of breath.   Objective:    General: Well Developed, well nourished, and in no acute distress.  Neuro: Alert and oriented x3, extra-ocular muscles intact, sensation grossly intact.  HEENT: Normocephalic, atraumatic left TM and canals clear.  Right TM is blocked by cerumen.  Oropharynx is clear.  No significant cervical lymphadenopathy. Skin: Warm and dry, no rashes. Cardiac: Regular rate and rhythm, no murmurs rubs or gallops, no lower extremity edema.  Respiratory: Clear to auscultation bilaterally. Not using accessory muscles, speaking in full sentences.   Impression and Recommendations:    HTN - Well controlled. Continue current regimen. Follow up in 6 months.  Anxiety/Depression   Anxiety/depression-PHQ 9 score of 0 and gad 7 score of 0 height-she is very happy with her regimen.  She does try to use her Xanax sparingly.  Sometimes once or twice a week on average.  Cerumen impaction, right ear-irrigation performed.  She tolerated  well.  Call if any probs or concerns.

## 2017-09-04 DIAGNOSIS — J0100 Acute maxillary sinusitis, unspecified: Secondary | ICD-10-CM | POA: Diagnosis not present

## 2017-09-05 ENCOUNTER — Other Ambulatory Visit: Payer: Self-pay | Admitting: Family Medicine

## 2017-10-11 ENCOUNTER — Telehealth: Payer: Self-pay

## 2017-10-11 MED ORDER — FLUVOXAMINE MALEATE 100 MG PO TABS: 100.0000 mg | ORAL_TABLET | Freq: Every day | ORAL | 0 refills | Status: DC

## 2017-10-11 NOTE — Telephone Encounter (Signed)
Pt requesting 5 day supply to be sent to local pharmacy while she waits for mail order to be delivered.   RX sent

## 2017-11-19 ENCOUNTER — Emergency Department (INDEPENDENT_AMBULATORY_CARE_PROVIDER_SITE_OTHER)
Admission: EM | Admit: 2017-11-19 | Discharge: 2017-11-19 | Disposition: A | Discharge status: Home / Self Care | Payer: BLUE CROSS/BLUE SHIELD | Source: Home / Self Care | Attending: Family Medicine | Admitting: Family Medicine

## 2017-11-19 ENCOUNTER — Encounter: Payer: Self-pay | Admitting: Emergency Medicine

## 2017-11-19 ENCOUNTER — Other Ambulatory Visit: Payer: Self-pay

## 2017-11-19 DIAGNOSIS — B349 Viral infection, unspecified: Secondary | ICD-10-CM

## 2017-11-19 DIAGNOSIS — J029 Acute pharyngitis, unspecified: Secondary | ICD-10-CM | POA: Diagnosis not present

## 2017-11-19 LAB — POCT RAPID STREP A (OFFICE): Rapid Strep A Screen: NEGATIVE

## 2017-11-19 MED ORDER — ACETAMINOPHEN 325 MG PO TABS
975.0000 mg | ORAL_TABLET | Freq: Once | ORAL | Status: AC
  Administered 2017-11-19: 975 mg via ORAL

## 2017-11-19 NOTE — ED Provider Notes (Signed)
[name]    CSN: [id number] Arrival date & time: 11/19/17  1724     History   Chief Complaint Chief Complaint  Patient presents with  . Sore Throat  . Chills  . Generalized Body Aches    HPI Tina Meyer is a 57 y.o. female.   HPI Tina Meyer is a 57 y.o. female presenting to UC with c/o sudden onset body aches, chills, temp of 100*F, sore throat, mild congestion and minimally productive cough since this morning. Temporary relief after taking ibuprofen around 10:30AM.  She does have a 4yo foster child at her home who has had congestion, cough and sore throat since this weekend. No known exposure to strep. Denies n/v/d. She is able to swallow liquids. No trouble breathing.    Past Medical History:  Diagnosis Date  . Depression   . Hyperlipidemia   . Hypertension     Patient Active Problem List   Diagnosis Date Noted  . Mild aortic sclerosis ([place]) 01/30/2016  . Heart murmur 08/07/2011  . ALLERGIC PURPURA 12/19/2010  . DERMATITIS, ATOPIC 12/19/2010  . Hyperlipidemia 05/27/2008  . ANXIETY DEPRESSION 05/27/2008  . HYPERTENSION, BENIGN 05/27/2008  . INSOMNIA 05/27/2008    Past Surgical History:  Procedure Laterality Date  . ABDOMINAL HYSTERECTOMY  2004    OB History   None      Home Medications    Prior to Admission medications   Medication Sig Start Date End Date Taking? Authorizing Provider  ALPRAZolam Duanne Moron) 0.5 MG tablet Take 1 tablet (0.5 mg total) by mouth daily. Try not to take every night. 07/05/17   Hali Marry, MD  atorvastatin (LIPITOR) 40 MG tablet Take 1 tablet (40 mg total) by mouth daily. 09/05/16   Hali Marry, MD  azelastine (ASTELIN) 0.1 % nasal spray Place 2 sprays into both nostrils 2 (two) times daily. Use in each nostril as directed 08/16/17   Hali Marry, MD  candesartan-hydrochlorothiazide (ATACAND HCT) 16-12.5 MG tablet Take 1 tablet by mouth daily. 08/16/17   Hali Marry,  MD  estradiol (VIVELLE-DOT) 0.025 MG/24HR Place 1 patch onto the skin 2 (two) times a week. 01/30/16   Hali Marry, MD  fluticasone (FLONASE) 50 MCG/ACT nasal spray SPRAY 2 SPRAYS INTO EACH NOSTRIL EVERY DAY 09/05/17   Hali Marry, MD  fluvoxaMINE (LUVOX) 100 MG tablet Take 1 tablet (100 mg total) by mouth daily. Short supply while pt waits for mail order 10/11/17   Hali Marry, MD  traZODone (DESYREL) 50 MG tablet Take 0.5-1 tablets (25-50 mg total) by mouth daily. 02/06/17   Hali Marry, MD    Family History Family History  Problem Relation Age of Onset  . Breast cancer Maternal Aunt   . Heart disease Maternal Aunt     Social History Social History   Tobacco Use  . Smoking status: Never Smoker  . Smokeless tobacco: Never Used  Substance Use Topics  . Alcohol use: No    Alcohol/week: 0.0 oz  . Drug use: No     Allergies   Aspirin; Lisinopril; Losartan; and Rosuvastatin   Review of Systems Review of Systems  Constitutional: Positive for chills and fever.  HENT: Positive for congestion and sore throat. Negative for ear pain, trouble swallowing and voice change.   Respiratory: Positive for cough. Negative for shortness of breath.   Cardiovascular: Negative for chest pain and palpitations.  Gastrointestinal: Negative for abdominal pain, diarrhea, nausea and vomiting.  Musculoskeletal:  Negative for arthralgias, back pain and myalgias.  Skin: Negative for rash.     Physical Exam Triage Vital Signs ED Triage Vitals  Enc Vitals Group     BP 11/19/17 1746 138/81     Pulse Rate 11/19/17 1746 98     Resp 11/19/17 1746 18     Temp 11/19/17 1746 99.7 F (37.6 C)     Temp Source 11/19/17 1746 Oral     SpO2 11/19/17 1746 97 %     Weight 11/19/17 1747 149 lb (67.6 kg)     Height 11/19/17 1747 5\' 2"  (1.575 m)     Head Circumference --      Peak Flow --      Pain Score 11/19/17 1747 7     Pain Loc --      Pain Edu? --      Excl. in [place]?  --    No data found.  Updated Vital Signs BP 138/81 (BP Location: Right Arm)   Pulse 98   Temp 99.7 F (37.6 C) (Oral)   Resp 18   Ht 5\' 2"  (1.575 m)   Wt 149 lb (67.6 kg)   SpO2 97%   BMI 27.25 kg/m   Visual Acuity Right Eye Distance:   Left Eye Distance:   Bilateral Distance:    Right Eye Near:   Left Eye Near:    Bilateral Near:     Physical Exam  Constitutional: She is oriented to person, place, and time. She appears well-developed and well-nourished.  Non-toxic appearance. She does not appear ill. No distress.  HENT:  Head: Normocephalic and atraumatic.  Right Ear: Tympanic membrane normal.  Left Ear: Tympanic membrane normal.  Nose: Nose normal.  Mouth/Throat: Uvula is midline and mucous membranes are normal. Posterior oropharyngeal edema and posterior oropharyngeal erythema present. No oropharyngeal exudate or tonsillar abscesses.  Eyes: EOM are normal.  Neck: Normal range of motion. Neck supple.  Cardiovascular: Normal rate and regular rhythm.  Pulmonary/Chest: Effort normal and breath sounds normal. No stridor. She has no wheezes. She has no rhonchi.  Musculoskeletal: Normal range of motion.  Lymphadenopathy:    She has no cervical adenopathy.  Neurological: She is alert and oriented to person, place, and time.  Skin: Skin is warm and dry.  Psychiatric: She has a normal mood and affect. Her behavior is normal.  Nursing note and vitals reviewed.    UC Treatments / Results  Labs (all labs ordered are listed, but only abnormal results are displayed) Labs Reviewed  STREP A DNA PROBE  POCT RAPID STREP A (OFFICE)    EKG None  Radiology No results found.  Procedures Procedures (including critical care time)  Medications Ordered in UC Medications  acetaminophen (TYLENOL) tablet 975 mg (has no administration in time range)    Initial Impression / Assessment and Plan / UC Course  I have reviewed the triage vital signs and the nursing  notes.  Pertinent labs & imaging results that were available during my care of the patient were reviewed by me and considered in my medical decision making (see chart for details).     Rapid strep: NEGATIVE Culture sent Will tx as viral illness at this time.  Final Clinical Impressions(s) / UC Diagnoses   Final diagnoses:  Viral syndrome  Pharyngitis, unspecified etiology     Discharge Instructions      You may take 500mg  acetaminophen every 4-6 hours or in combination with ibuprofen 400-600mg  every 6-8 hours as needed for  pain, inflammation, and fever.  Be sure to stay well hydrated drinking plenty of water and get at least 8 hours of sleep at night, preferably more while sick.   Please follow up with family medicine in 7-10 days if not improving, sooner if significantly worsening.     ED Prescriptions    None     Controlled Substance Prescriptions [place] Controlled Substance Registry consulted? Not Applicable   Tyrell Antonio 11/19/17 2712

## 2017-11-19 NOTE — Discharge Instructions (Signed)
°  You may take 500mg  acetaminophen every 4-6 hours or in combination with ibuprofen 400-600mg  every 6-8 hours as needed for pain, inflammation, and fever.  Be sure to stay well hydrated drinking plenty of water and get at least 8 hours of sleep at night, preferably more while sick.   Please follow up with family medicine in 7-10 days if not improving, sooner if significantly worsening.

## 2017-11-19 NOTE — ED Triage Notes (Signed)
Patient awoke this morning with very sore throat, chills and aches; has sick child at home; took ibuprofen at 1030.

## 2017-11-20 ENCOUNTER — Telehealth: Payer: Self-pay | Admitting: Emergency Medicine

## 2017-11-20 LAB — STREP A DNA PROBE: Group A Strep Probe: NOT DETECTED

## 2017-12-10 ENCOUNTER — Other Ambulatory Visit: Payer: Self-pay | Admitting: Family Medicine

## 2017-12-10 ENCOUNTER — Other Ambulatory Visit: Payer: Self-pay | Admitting: *Deleted

## 2017-12-10 MED ORDER — FLUVOXAMINE MALEATE 100 MG PO TABS: 100.0000 mg | ORAL_TABLET | Freq: Every day | ORAL | 1 refills | Status: DC

## 2017-12-10 NOTE — Telephone Encounter (Signed)
Incoming requests for refills for , Fluvoxamine 100 mg,Alprazolam 0.5 mg, Trazodone 50 mg, will route to pcp for signature.Tina Meyer, Tina Meyer

## 2017-12-11 MED ORDER — TRAZODONE HCL 50 MG PO TABS: 25.0000 mg | ORAL_TABLET | Freq: Every day | ORAL | 1 refills | Status: DC

## 2017-12-11 MED ORDER — ALPRAZOLAM 0.5 MG PO TABS: 0.5000 mg | ORAL_TABLET | Freq: Every evening | ORAL | 0 refills | Status: DC | PRN

## 2018-01-24 ENCOUNTER — Ambulatory Visit: Payer: BLUE CROSS/BLUE SHIELD

## 2018-02-04 DIAGNOSIS — J320 Chronic maxillary sinusitis: Secondary | ICD-10-CM | POA: Diagnosis not present

## 2018-02-04 DIAGNOSIS — J209 Acute bronchitis, unspecified: Secondary | ICD-10-CM | POA: Diagnosis not present

## 2018-02-17 ENCOUNTER — Other Ambulatory Visit: Payer: Self-pay | Admitting: Family Medicine

## 2018-02-18 ENCOUNTER — Encounter: Payer: Self-pay | Admitting: Family Medicine

## 2018-02-18 ENCOUNTER — Ambulatory Visit (INDEPENDENT_AMBULATORY_CARE_PROVIDER_SITE_OTHER): Payer: BLUE CROSS/BLUE SHIELD | Admitting: Family Medicine

## 2018-02-18 VITALS — BP 132/62 | HR 65 | Ht 61.42 in | Wt 146.0 lb

## 2018-02-18 DIAGNOSIS — J0100 Acute maxillary sinusitis, unspecified: Secondary | ICD-10-CM

## 2018-02-18 MED ORDER — AMOXICILLIN-POT CLAVULANATE 875-125 MG PO TABS: 1.0000 | ORAL_TABLET | Freq: Two times a day (BID) | ORAL | 0 refills | Status: DC

## 2018-02-18 NOTE — Progress Notes (Signed)
   Subjective:    Patient ID: Tina Meyer, female    DOB: [birth year], 57 y.o.   MRN: [id number]  HPI 57 yo female comes in today complaining of sinus symptoms for greater than 3 weeks.  After symptoms for a couple of weeks she actually went to urgent care at [facility] and given an injection of prednisone and inhaler Hycodan syrup, and Tessalon Perles.  She did not feel like the inhaler was helping because of the time she had a significant cough with wheezing.  The chest symptoms are much better but she still is having sinus symptoms.  She is not feeling significantly better and in fact now is complaining of facial pressure and pain as well as some green mucus.  She denies any fever sweats or chills.  She says at this point she is just tired of feeling sick.  She did have a round of amoxicillin back in September but she was actually not treated with any of antibiotics at this last visit.  She had a normal chest x-ray and sinus film.  Her facial pressure is bilateral low and mostly behind the eyes and over the maxillary sinuses.    Review of Systems     Objective:   Physical Exam  Constitutional: She is oriented to person, place, and time. She appears well-developed and well-nourished.  HENT:  Head: Normocephalic and atraumatic.  Right Ear: External ear normal.  Left Ear: External ear normal.  Nose: Nose normal.  Mouth/Throat: Oropharynx is clear and moist.  TMs and canals are clear.   Eyes: Pupils are equal, round, and reactive to light. Conjunctivae and EOM are normal.  Neck: Neck supple. No thyromegaly present.  Cardiovascular: Normal rate, regular rhythm and normal heart sounds.  Pulmonary/Chest: Effort normal and breath sounds normal. She has no wheezes.  Lymphadenopathy:    She has no cervical adenopathy.  Neurological: She is alert and oriented to person, place, and time.  Skin: Skin is warm and dry.  Psychiatric: She has a normal mood and affect.       Assessment &  Plan:  Acute sinusitis  -we will treat with Augmentin. Call if not better in 1 week.  It sounds like she may have gotten a couple back-to-back viruses.  She is taking care of a foster child who has been from sick frequently this fall.

## 2018-02-21 ENCOUNTER — Encounter: Payer: Self-pay | Admitting: Family Medicine

## 2018-02-21 ENCOUNTER — Ambulatory Visit (INDEPENDENT_AMBULATORY_CARE_PROVIDER_SITE_OTHER): Payer: BLUE CROSS/BLUE SHIELD | Admitting: Family Medicine

## 2018-02-21 ENCOUNTER — Telehealth: Payer: Self-pay

## 2018-02-21 VITALS — BP 135/67 | HR 89 | Ht 61.42 in | Wt 146.0 lb

## 2018-02-21 DIAGNOSIS — I10 Essential (primary) hypertension: Secondary | ICD-10-CM | POA: Diagnosis not present

## 2018-02-21 DIAGNOSIS — E785 Hyperlipidemia, unspecified: Secondary | ICD-10-CM

## 2018-02-21 DIAGNOSIS — Z1231 Encounter for screening mammogram for malignant neoplasm of breast: Secondary | ICD-10-CM

## 2018-02-21 DIAGNOSIS — Z0000 Encounter for general adult medical examination without abnormal findings: Secondary | ICD-10-CM | POA: Diagnosis not present

## 2018-02-21 MED ORDER — CANDESARTAN CILEXETIL-HCTZ 16-12.5 MG PO TABS: 1.0000 | ORAL_TABLET | Freq: Every day | ORAL | 1 refills | Status: DC

## 2018-02-21 MED ORDER — ALPRAZOLAM 0.5 MG PO TABS: 0.5000 mg | ORAL_TABLET | Freq: Every evening | ORAL | 0 refills | Status: DC | PRN

## 2018-02-21 MED ORDER — HYDROCODONE-HOMATROPINE 5-1.5 MG/5ML PO SYRP
5.0000 mL | ORAL_SOLUTION | Freq: Every evening | ORAL | 0 refills | Status: DC | PRN
Start: 2018-02-21 — End: 2018-05-30

## 2018-02-21 MED ORDER — HYDROCODONE-HOMATROPINE 5-1.5 MG/5ML PO SYRP: 5.0000 mL | ORAL_SOLUTION | Freq: Every evening | ORAL | 0 refills | Status: DC | PRN

## 2018-02-21 MED ORDER — FLUTICASONE PROPIONATE 50 MCG/ACT NA SUSP: NASAL | 3 refills | Status: DC

## 2018-02-21 NOTE — Patient Instructions (Signed)

## 2018-02-21 NOTE — Telephone Encounter (Signed)
Pt was under the impression cough medication was going to be sent in for her today.   Please advise and send if appropriate. Thanks

## 2018-02-21 NOTE — Telephone Encounter (Signed)
Pt advised.

## 2018-02-21 NOTE — Telephone Encounter (Signed)
Prescription sent to pharmacy.

## 2018-02-21 NOTE — Progress Notes (Addendum)
Subjective:     Tina Meyer is a 57 y.o. female and is here for a comprehensive physical exam. The patient reports no problems.  Social History   Socioeconomic History  . Marital status: Widowed    Spouse name: Not on file  . Number of children: 0  . Years of education: Not on file  . Highest education level: Not on file  Occupational History  . Occupation: dental office    Comment: [employer]  Social Needs  . Financial resource strain: Not on file  . Food insecurity:    Worry: Not on file    Inability: Not on file  . Transportation needs:    Medical: Not on file    Non-medical: Not on file  Tobacco Use  . Smoking status: Never Smoker  . Smokeless tobacco: Never Used  Substance and Sexual Activity  . Alcohol use: No    Alcohol/week: 0.0 standard drinks  . Drug use: No  . Sexual activity: Not Currently  Lifestyle  . Physical activity:    Days per week: Not on file    Minutes per session: Not on file  . Stress: Not on file  Relationships  . Social connections:    Talks on phone: Not on file    Gets together: Not on file    Attends religious service: Not on file    Active member of club or organization: Not on file    Attends meetings of clubs or organizations: Not on file    Relationship status: Not on file  . Intimate partner violence:    Fear of current or ex partner: Not on file    Emotionally abused: Not on file    Physically abused: Not on file    Forced sexual activity: Not on file  Other Topics Concern  . Not on file  Social History Narrative   Had dogs.  Occ exercise.  1 caffeine drink per days.     Health Maintenance  Topic Date Due  . COLONOSCOPY  05/31/2018 (Originally 07/30/2010)  . MAMMOGRAM  02/16/2019  . TETANUS/TDAP  10/11/2022  . INFLUENZA VACCINE  Completed  . Hepatitis C Screening  Completed  . HIV Screening  Completed    The following portions of the patient's history were reviewed and updated as appropriate:  allergies, current medications, past family history, past medical history, past social history, past surgical history and problem list.  Review of Systems A comprehensive review of systems was negative.   Objective:    BP 135/67   Pulse 89   Ht 5' 1.42" (1.56 m)   Wt 146 lb (66.2 kg)   SpO2 97%   BMI 27.21 kg/m  General appearance: alert, cooperative and appears stated age Head: Normocephalic, without obvious abnormality, atraumatic Eyes: conj clear, EOMI, PEERLA Ears: normal TM's and external ear canals both ears Nose: Nares normal. Septum midline. Mucosa normal. No drainage or sinus tenderness. Throat: lips, mucosa, and tongue normal; teeth and gums normal Neck: no adenopathy, no carotid bruit, no JVD, supple, symmetrical, trachea midline and thyroid not enlarged, symmetric, no tenderness/mass/nodules Back: symmetric, no curvature. ROM normal. No CVA tenderness. Lungs: clear to auscultation bilaterally Breasts: normal appearance, no masses or tenderness Heart: RRR with a high pitched squealy 4/6 SEM Abdomen: soft, non-tender; bowel sounds normal; no masses,  no organomegaly Extremities: extremities normal, atraumatic, no cyanosis or edema Pulses: 2+ and symmetric Skin: Skin color, texture, turgor normal. No rashes or lesions Lymph nodes: Cervical, supraclavicular,  and axillary nodes normal. Neurologic: Alert and oriented X 3, normal strength and tone. Normal symmetric reflexes. Normal coordination and gait    Assessment:    Healthy female exam.      Plan:     See After Visit Summary for Counseling Recommendations   Keep up a regular exercise program and make sure you are eating a healthy diet Try to eat 4 servings of dairy a day, or if you are lactose intolerant take a calcium with vitamin D daily.  Your vaccines are up to date.  Mammo ordered.   Note, she is feeling better on her antibiotics thus far but would like something for cough.  The cough is been keeping her  awake at night.  Prescription sent for Hycodan cough syrup.

## 2018-02-28 ENCOUNTER — Ambulatory Visit: Payer: BLUE CROSS/BLUE SHIELD

## 2018-03-06 ENCOUNTER — Telehealth: Payer: Self-pay

## 2018-03-06 NOTE — Telephone Encounter (Signed)
Tina Meyer called and left a message stating she is still sick with congestion and a cough with green sputum. She has finished the antibiotic and wanted to know if she needs a different antibiotic. Please advise.

## 2018-03-07 MED ORDER — AZITHROMYCIN 250 MG PO TABS: ORAL_TABLET | ORAL | 0 refills | Status: AC

## 2018-03-07 NOTE — Telephone Encounter (Signed)
Ok , will try a zpak. Will cover for atypical bacteria.  If not better after 5 days needs OV

## 2018-03-07 NOTE — Telephone Encounter (Signed)
Attempted to call pt, no answer and voicemail was full.

## 2018-04-10 ENCOUNTER — Other Ambulatory Visit: Payer: Self-pay

## 2018-04-10 MED ORDER — ATORVASTATIN CALCIUM 40 MG PO TABS: 40.0000 mg | ORAL_TABLET | Freq: Every day | ORAL | 0 refills | Status: DC

## 2018-04-10 MED ORDER — FLUVOXAMINE MALEATE 100 MG PO TABS: 100.0000 mg | ORAL_TABLET | Freq: Every day | ORAL | 1 refills | Status: DC

## 2018-04-10 NOTE — Telephone Encounter (Signed)
Joelene Millin requests a refill [facility]. Please advise.

## 2018-04-11 MED ORDER — ALPRAZOLAM 0.5 MG PO TABS: 0.5000 mg | ORAL_TABLET | Freq: Every evening | ORAL | 0 refills | Status: DC | PRN

## 2018-05-30 ENCOUNTER — Ambulatory Visit (INDEPENDENT_AMBULATORY_CARE_PROVIDER_SITE_OTHER): Payer: BLUE CROSS/BLUE SHIELD | Admitting: Physician Assistant

## 2018-05-30 ENCOUNTER — Encounter: Payer: Self-pay | Admitting: Physician Assistant

## 2018-05-30 VITALS — BP 127/63 | HR 78 | Temp 98.3°F | Ht 61.42 in | Wt 152.0 lb

## 2018-05-30 DIAGNOSIS — J329 Chronic sinusitis, unspecified: Secondary | ICD-10-CM | POA: Diagnosis not present

## 2018-05-30 DIAGNOSIS — J40 Bronchitis, not specified as acute or chronic: Secondary | ICD-10-CM

## 2018-05-30 MED ORDER — AMOXICILLIN-POT CLAVULANATE 875-125 MG PO TABS: 1.0000 | ORAL_TABLET | Freq: Two times a day (BID) | ORAL | 0 refills | Status: DC

## 2018-05-30 MED ORDER — HYDROCODONE-HOMATROPINE 5-1.5 MG/5ML PO SYRP: 5.0000 mL | ORAL_SOLUTION | Freq: Four times a day (QID) | ORAL | 0 refills | Status: DC | PRN

## 2018-05-30 NOTE — Patient Instructions (Signed)
Acute Bronchitis, Adult Acute bronchitis is when air tubes (bronchi) in the lungs suddenly get swollen. The condition can make it hard to breathe. It can also cause these symptoms:  A cough.  Coughing up clear, yellow, or green mucus.  Wheezing.  Chest congestion.  Shortness of breath.  A fever.  Body aches.  Chills.  A sore throat. Follow these instructions at home:  Medicines  Take over-the-counter and prescription medicines only as told by your doctor.  If you were prescribed an antibiotic medicine, take it as told by your doctor. Do not stop taking the antibiotic even if you start to feel better. General instructions  Rest.  Drink enough fluids to keep your pee (urine) pale yellow.  Avoid smoking and secondhand smoke. If you smoke and you need help quitting, ask your doctor. Quitting will help your lungs heal faster.  Use an inhaler, cool mist vaporizer, or humidifier as told by your doctor.  Keep all follow-up visits as told by your doctor. This is important. How is this prevented? To lower your risk of getting this condition again:  Wash your hands often with soap and water. If you cannot use soap and water, use hand sanitizer.  Avoid contact with people who have cold symptoms.  Try not to touch your hands to your mouth, nose, or eyes.  Make sure to get the flu shot every year. Contact a doctor if:  Your symptoms do not get better in 2 weeks. Get help right away if:  You cough up blood.  You have chest pain.  You have very bad shortness of breath.  You become dehydrated.  You faint (pass out) or keep feeling like you are going to pass out.  You keep throwing up (vomiting).  You have a very bad headache.  Your fever or chills gets worse. This information is not intended to replace advice given to you by your health care provider. Make sure you discuss any questions you have with your health care provider. Document Released: 09/19/2007 Document  Revised: 11/14/2016 Document Reviewed: 09/21/2015 Elsevier Interactive Patient Education  2019 Elsevier Inc.  

## 2018-05-30 NOTE — Progress Notes (Deleted)
d 

## 2018-05-30 NOTE — Progress Notes (Signed)
   Subjective:    Patient ID: Tina Meyer, female    DOB: Feb 13, 1961, 58 y.o.   MRN: [id number]  HPI  Tina Meyer presents today not feeling well since Sunday. She says Sunday she began to have a fever, chills and headache that has not gotten better. Her fever broke Thursday but she continues to endorse cough with green sputum, nasal congestion, chills, headache, sinus pressure, nasal drainage and nausea. She denies chest pain, shortness of breath or vomiting. She has tried tylenol over the counter which has helped her fever but not relieved any of her symptoms. She has a 55 year old foster child at home who has also been sick.  .. Active Ambulatory Problems    Diagnosis Date Noted  . Hyperlipidemia 05/27/2008  . ANXIETY DEPRESSION 05/27/2008  . HYPERTENSION, BENIGN 05/27/2008  . INSOMNIA 05/27/2008  . ALLERGIC PURPURA 12/19/2010  . DERMATITIS, ATOPIC 12/19/2010  . Heart murmur 08/07/2011  . Mild aortic sclerosis ([place]) 01/30/2016   Resolved Ambulatory Problems    Diagnosis Date Noted  . Dysfunction of eustachian tube 06/06/2010  . Laryngitis 03/25/2012  . Cough 07/19/2015   Past Medical History:  Diagnosis Date  . Depression   . Hypertension      Review of Systems  Constitutional: Positive for chills, fatigue and fever.  HENT: Positive for congestion, ear pain, postnasal drip, sinus pressure and sore throat. Negative for rhinorrhea.   Eyes: Negative.   Respiratory: Positive for cough and chest tightness. Negative for shortness of breath and wheezing.   Cardiovascular: Positive for chest pain.  Gastrointestinal: Positive for nausea. Negative for diarrhea and vomiting.  Genitourinary: Negative.        Objective:   Physical Exam Constitutional:      Appearance: She is ill-appearing.  HENT:     Head: Normocephalic and atraumatic.     Right Ear: Tympanic membrane normal.     Ears:     Comments: Left ear there is some fluid present behind TM. Decreased cone of light     Nose: Congestion present.     Mouth/Throat:     Pharynx: Posterior oropharyngeal erythema present. No oropharyngeal exudate.  Neck:     Musculoskeletal: Muscular tenderness present.  Cardiovascular:     Rate and Rhythm: Normal rate and regular rhythm.     Heart sounds: Murmur present.  Pulmonary:     Effort: Pulmonary effort is normal.     Breath sounds: Rhonchi present.  Lymphadenopathy:     Cervical: Cervical adenopathy present.  Neurological:     Mental Status: She is alert and oriented to person, place, and time.           Assessment & Plan:  Marland KitchenMarland KitchenMaryem was seen today for cough.  Diagnoses and all orders for this visit:  Sinobronchitis -     HYDROcodone-homatropine (HYCODAN) 5-1.5 MG/5ML syrup; Take 5 mLs by mouth every 6 (six) hours as needed for cough. -     amoxicillin-clavulanate (AUGMENTIN) 875-125 MG tablet; Take 1 tablet by mouth 2 (two) times daily.  Treated with augmentin and hycodan. Only use hycodan at night. Symptomatic care discussed. HO given. Pt has albuterol at home use as needed every 2-4 hours for cough and SOB. Start using flonase.  Follow up as needed.    Marland KitchenVernetta Honey PA-C, have reviewed and agree with the above documentation in it's entirety.

## 2018-06-09 ENCOUNTER — Telehealth: Payer: Self-pay

## 2018-06-09 NOTE — Telephone Encounter (Signed)
Unable to leave a message,mailbox is full. 

## 2018-06-09 NOTE — Telephone Encounter (Signed)
Sounds like  post viral cough that can last multiple weeks after infection. I did not give you prednisone but that at times can stop the inflammation and cough. Would you like for me to send?

## 2018-06-09 NOTE — Telephone Encounter (Signed)
Tina Meyer called and states she still has a cough. Advised her to take OTC Mucinex with plenty of water.

## 2018-06-12 NOTE — Telephone Encounter (Signed)
Patient would [facility] [address]. She would also like the Tessolon cough medication. Please advise.

## 2018-06-13 MED ORDER — PREDNISONE 50 MG PO TABS: ORAL_TABLET | ORAL | 0 refills | Status: DC

## 2018-06-13 MED ORDER — BENZONATATE 200 MG PO CAPS: 200.0000 mg | ORAL_CAPSULE | Freq: Two times a day (BID) | ORAL | 0 refills | Status: DC | PRN

## 2018-06-13 NOTE — Addendum Note (Signed)
Addended by: Donella Stade on: 06/13/2018 06:40 AM   Modules accepted: Orders

## 2018-06-13 NOTE — Telephone Encounter (Signed)
Done

## 2018-07-01 ENCOUNTER — Other Ambulatory Visit: Payer: Self-pay | Admitting: Family Medicine

## 2018-07-01 ENCOUNTER — Other Ambulatory Visit: Payer: Self-pay | Admitting: *Deleted

## 2018-07-02 ENCOUNTER — Other Ambulatory Visit: Payer: Self-pay | Admitting: *Deleted

## 2018-07-02 DIAGNOSIS — I10 Essential (primary) hypertension: Secondary | ICD-10-CM

## 2018-07-02 MED ORDER — CANDESARTAN CILEXETIL-HCTZ 16-12.5 MG PO TABS: 1.0000 | ORAL_TABLET | Freq: Every day | ORAL | 1 refills | Status: DC

## 2018-07-02 MED ORDER — ALPRAZOLAM 0.5 MG PO TABS: 0.5000 mg | ORAL_TABLET | Freq: Every evening | ORAL | 0 refills | Status: DC | PRN

## 2018-07-02 NOTE — Telephone Encounter (Signed)
Error

## 2018-08-04 DIAGNOSIS — M722 Plantar fascial fibromatosis: Secondary | ICD-10-CM | POA: Diagnosis not present

## 2018-10-14 ENCOUNTER — Other Ambulatory Visit: Payer: Self-pay | Admitting: Family Medicine

## 2018-10-21 ENCOUNTER — Other Ambulatory Visit: Payer: Self-pay | Admitting: *Deleted

## 2018-10-21 MED ORDER — ATORVASTATIN CALCIUM 40 MG PO TABS: 40.0000 mg | ORAL_TABLET | Freq: Every day | ORAL | 4 refills | Status: DC

## 2018-10-29 ENCOUNTER — Telehealth: Payer: Self-pay | Admitting: Family Medicine

## 2018-10-29 DIAGNOSIS — L820 Inflamed seborrheic keratosis: Secondary | ICD-10-CM | POA: Diagnosis not present

## 2018-10-29 DIAGNOSIS — H00011 Hordeolum externum right upper eyelid: Secondary | ICD-10-CM | POA: Diagnosis not present

## 2018-10-29 NOTE — Telephone Encounter (Signed)
Please call patient and let her know that I did receive her form.  I can fill it out to the best of my knowledge based on her visit in February for bronchitis.  But please let her know that she is actually overdue for a follow-up appointment for her hypertension.  Typically we see her twice a year once for her physical and then in between for blood pressure and medication follow-up.  Please encourage her to schedule at her convenience.

## 2018-10-30 NOTE — Telephone Encounter (Signed)
vm is full.Abrahan Fulmore Lynetta, CMA  

## 2018-10-31 ENCOUNTER — Telehealth: Payer: Self-pay | Admitting: Family Medicine

## 2018-10-31 NOTE — Telephone Encounter (Signed)
Patient answered phone and let her know that her voicemail was full and that we would leave it over in the urgent care for her to pick up this weekend.

## 2018-10-31 NOTE — Telephone Encounter (Signed)
Pt called and wanted to know if her medical Evaluation forms are ready for pick up and if you can call her cell and update her on when she can pick this up. She states you may have to leave her a message since she is at work.

## 2018-10-31 NOTE — Telephone Encounter (Signed)
Called patient to advise that form is ready for pickup but the mailbox is full. Rhonda Cunningham,CMA

## 2018-11-05 NOTE — Telephone Encounter (Signed)
vm is full.Fredia Chittenden Lynetta, CMA  

## 2018-11-07 NOTE — Telephone Encounter (Signed)
Pt already scheduled

## 2018-11-11 NOTE — Telephone Encounter (Signed)
Will give pt her form at [place].Tina Meyer, [place]

## 2018-11-12 ENCOUNTER — Other Ambulatory Visit: Payer: Self-pay

## 2018-11-12 ENCOUNTER — Ambulatory Visit (INDEPENDENT_AMBULATORY_CARE_PROVIDER_SITE_OTHER): Payer: BC Managed Care – PPO | Admitting: Family Medicine

## 2018-11-12 ENCOUNTER — Encounter: Payer: Self-pay | Admitting: Family Medicine

## 2018-11-12 VITALS — BP 128/59 | HR 78 | Temp 98.2°F | Ht 61.0 in | Wt 150.0 lb

## 2018-11-12 DIAGNOSIS — E785 Hyperlipidemia, unspecified: Secondary | ICD-10-CM

## 2018-11-12 DIAGNOSIS — F341 Dysthymic disorder: Secondary | ICD-10-CM

## 2018-11-12 DIAGNOSIS — F5101 Primary insomnia: Secondary | ICD-10-CM | POA: Diagnosis not present

## 2018-11-12 DIAGNOSIS — I10 Essential (primary) hypertension: Secondary | ICD-10-CM

## 2018-11-12 MED ORDER — AZELASTINE HCL 0.1 % NA SOLN
2.0000 | Freq: Two times a day (BID) | NASAL | 6 refills | Status: DC
Start: 2018-11-12 — End: 2019-09-30

## 2018-11-12 MED ORDER — TRAZODONE HCL 50 MG PO TABS: 25.0000 mg | ORAL_TABLET | Freq: Every day | ORAL | 1 refills | Status: DC

## 2018-11-12 NOTE — Assessment & Plan Note (Signed)
Well controlled. Continue current regimen. Follow up in  6 months.  

## 2018-11-12 NOTE — Assessment & Plan Note (Signed)
Continue fluvoxamine-overall doing well with current regimen.

## 2018-11-12 NOTE — Progress Notes (Signed)
Established Patient Office Visit  Subjective:  Patient ID: Tina Meyer, female    DOB: 1960-11-26  Age: 58 y.o. MRN: [id number]  CC:  Chief Complaint  Patient presents with  . Follow-up    HPI Tina Meyer presents for   Hypertension- Pt denies chest pain, SOB, dizziness, or heart palpitations.  Taking meds as directed w/o problems.  Denies medication side effects.    Anxiety/Depression - Currently on Fluvoxamine. Doing well overall.  Work has been more stressful since [place].     Hyperlipidemia - tolerating stating well with no myalgias or significant side effects.  Lab Results  Component Value Date   CHOL 204 (H) 02/22/2017   CHOL 189 05/23/2015   CHOL 325 (H) 07/16/2014   Lab Results  Component Value Date   HDL 56 02/22/2017   HDL 54 05/23/2015   HDL 67 07/16/2014   Lab Results  Component Value Date   LDLCALC 121 (H) 02/22/2017   LDLCALC 97 05/23/2015   LDLCALC 227 (H) 07/16/2014   Lab Results  Component Value Date   TRIG 154 (H) 02/22/2017   TRIG 191 (H) 05/23/2015   TRIG 154 (H) 07/16/2014   Lab Results  Component Value Date   CHOLHDL 3.6 02/22/2017   CHOLHDL 3.5 05/23/2015   CHOLHDL 4.9 07/16/2014   No results found for: LDLDIRECT   F/U insomnia -    BrCa screening due in Nov, she goes every other year.    Past Medical History:  Diagnosis Date  . Depression   . Hyperlipidemia   . Hypertension     Past Surgical History:  Procedure Laterality Date  . ABDOMINAL HYSTERECTOMY  2004    Family History  Problem Relation Age of Onset  . Breast cancer Maternal Aunt   . Heart disease Maternal Aunt     Social History   Socioeconomic History  . Marital status: Widowed    Spouse name: Not on file  . Number of children: 0  . Years of education: Not on file  . Highest education level: Not on file  Occupational History  . Occupation: dental office    Comment: [employer]  Social Needs  . Financial resource  strain: Not on file  . Food insecurity    Worry: Not on file    Inability: Not on file  . Transportation needs    Medical: Not on file    Non-medical: Not on file  Tobacco Use  . Smoking status: Never Smoker  . Smokeless tobacco: Never Used  Substance and Sexual Activity  . Alcohol use: No    Alcohol/week: 0.0 standard drinks  . Drug use: No  . Sexual activity: Not Currently  Lifestyle  . Physical activity    Days per week: Not on file    Minutes per session: Not on file  . Stress: Not on file  Relationships  . Social Herbalist on phone: Not on file    Gets together: Not on file    Attends religious service: Not on file    Active member of club or organization: Not on file    Attends meetings of clubs or organizations: Not on file    Relationship status: Not on file  . Intimate partner violence    Fear of current or ex partner: Not on file    Emotionally abused: Not on file    Physically abused: Not on file    Forced sexual activity: Not on file  Other Topics Concern  . Not on file  Social History Narrative   Had dogs.  Occ exercise.  1 caffeine drink per days.      Outpatient Medications Prior to Visit  Medication Sig Dispense Refill  . ALPRAZolam (XANAX) 0.5 MG tablet Take 1 tablet (0.5 mg total) by mouth at bedtime as needed for anxiety. Try not to take every night. 90 tablet 0  . atorvastatin (LIPITOR) 40 MG tablet Take 1 tablet (40 mg total) by mouth daily. 90 tablet 4  . candesartan-hydrochlorothiazide (ATACAND HCT) 16-12.5 MG tablet Take 1 tablet by mouth daily. 90 tablet 1  . fluticasone (FLONASE) 50 MCG/ACT nasal spray SPRAY 2 SPRAYS INTO EACH NOSTRIL EVERY DAY 16 g 3  . fluvoxaMINE (LUVOX) 100 MG tablet TAKE 1 TABLET DAILY 90 tablet 3  . PROAIR HFA 108 (90 Base) MCG/ACT inhaler     . azelastine (ASTELIN) 0.1 % nasal spray Place 2 sprays into both nostrils 2 (two) times daily. Use in each nostril as directed 30 mL 3  . traZODone (DESYREL) 50 MG  tablet Take 0.5-1 tablets (25-50 mg total) by mouth daily. 90 tablet 1   No facility-administered medications prior to visit.     Allergies  Allergen Reactions  . Lisinopril Anaphylaxis    REACTION: anaphalactic  . Aspirin Nausea Only    REACTION: nausea  . Losartan Other (See Comments)    Hair Loss  . Rosuvastatin     REACTION: myalgias    ROS Review of Systems    Objective:    Physical Exam  Constitutional: She is oriented to person, place, and time. She appears well-developed and well-nourished.  HENT:  Head: Normocephalic and atraumatic.  Cardiovascular: Normal rate, regular rhythm and normal heart sounds.  Pulmonary/Chest: Effort normal and breath sounds normal.  Neurological: She is alert and oriented to person, place, and time.  Skin: Skin is warm and dry.  Psychiatric: She has a normal mood and affect. Her behavior is normal.    BP (!) 128/59   Pulse 78   Temp 98.2 F (36.8 C) (Oral)   Ht _0  (1.549 m)   Wt 150 lb (68 kg)   BMI 28.34 kg/m  Wt Readings from Last 3 Encounters:  11/12/18 150 lb (68 kg)  05/30/18 152 lb (68.9 kg)  02/21/18 146 lb (66.2 kg)     Health Maintenance Due  Topic Date Due  . COLONOSCOPY  07/30/2010    There are no preventive care reminders to display for this patient.  Lab Results  Component Value Date   TSH 2.27 02/22/2017   Lab Results  Component Value Date   WBC 6.6 02/22/2017   HGB 13.4 02/22/2017   HCT 39.3 02/22/2017   MCV 79.6 (L) 02/22/2017   PLT 339 02/22/2017   Lab Results  Component Value Date   NA 142 02/22/2017   K 3.9 02/22/2017   CO2 27 02/22/2017   GLUCOSE 96 02/22/2017   BUN 18 02/22/2017   CREATININE 1.07 (H) 02/22/2017   BILITOT 0.6 02/22/2017   ALKPHOS 77 05/23/2015   AST 23 02/22/2017   ALT 20 02/22/2017   PROT 7.3 02/22/2017   ALBUMIN 4.1 05/23/2015   CALCIUM 9.7 02/22/2017   Lab Results  Component Value Date   CHOL 204 (H) 02/22/2017   Lab Results  Component Value Date    HDL 56 02/22/2017   Lab Results  Component Value Date   LDLCALC 121 (H) 02/22/2017   Lab Results  Component Value Date   TRIG 154 (H) 02/22/2017   Lab Results  Component Value Date   CHOLHDL 3.6 02/22/2017   No results found for: HGBA1C    Assessment & Plan:   Problem List Items Addressed This Visit      Cardiovascular and Mediastinum   HYPERTENSION, BENIGN - Primary    Well controlled. Continue current regimen. Follow up in  6 months.       Relevant Orders   COMPLETE METABOLIC PANEL WITH GFR   Lipid panel   TSH   CBC     Other   INSOMNIA    Happy with her current regimen she usually takes a quarter of a tab and it works well.      Hyperlipidemia    Due to recheck lipids.       Relevant Orders   COMPLETE METABOLIC PANEL WITH GFR   Lipid panel   TSH   CBC   ANXIETY DEPRESSION    Continue fluvoxamine-overall doing well with current regimen.      Relevant Medications   traZODone (DESYREL) 50 MG tablet      Due for colon and breast cancer screening.  She plans on going this fall for repeat mammogram she goes every 2 years.  In regards to colon cancer screening she is can check with her insurance to see if Cologuard is covered if it is she will let me know.  Meds ordered this encounter  Medications  . traZODone (DESYREL) 50 MG tablet    Sig: Take 0.5-1 tablets (25-50 mg total) by mouth daily.    Dispense:  90 tablet    Refill:  1  . azelastine (ASTELIN) 0.1 % nasal spray    Sig: Place 2 sprays into both nostrils 2 (two) times daily. Use in each nostril as directed    Dispense:  30 mL    Refill:  6    Follow-up: Return in about 6 months (around 05/15/2019) for Hypertension.    Beatrice Lecher, MD

## 2018-11-12 NOTE — Assessment & Plan Note (Signed)
Due to recheck lipids. 

## 2018-11-12 NOTE — Assessment & Plan Note (Signed)
Happy with her current regimen she usually takes a quarter of a tab and it works well.

## 2018-11-13 LAB — CBC
HCT: 37.2 % (ref 35.0–45.0)
Hemoglobin: 12.2 g/dL (ref 11.7–15.5)
MCH: 26.9 pg — ABNORMAL LOW (ref 27.0–33.0)
MCHC: 32.8 g/dL (ref 32.0–36.0)
MCV: 82.1 fL (ref 80.0–100.0)
MPV: 10.1 fL (ref 7.5–12.5)
Platelets: 315 10*3/uL (ref 140–400)
RBC: 4.53 10*6/uL (ref 3.80–5.10)
RDW: 13.3 % (ref 11.0–15.0)
WBC: 6.1 10*3/uL (ref 3.8–10.8)

## 2018-11-13 LAB — COMPLETE METABOLIC PANEL WITH GFR
AG Ratio: 1.8 (calc) (ref 1.0–2.5)
ALT: 16 U/L (ref 6–29)
AST: 21 U/L (ref 10–35)
Albumin: 4.6 g/dL (ref 3.6–5.1)
Alkaline phosphatase (APISO): 68 U/L (ref 37–153)
BUN: 19 mg/dL (ref 7–25)
CO2: 28 mmol/L (ref 20–32)
Calcium: 9.9 mg/dL (ref 8.6–10.4)
Chloride: 105 mmol/L (ref 98–110)
Creat: 0.96 mg/dL (ref 0.50–1.05)
GFR, Est African American: 76 mL/min/{1.73_m2} (ref >=60)
GFR, Est Non African American: 65 mL/min/{1.73_m2} (ref >=60)
Globulin: 2.5 g/dL (calc) (ref 1.9–3.7)
Glucose, Bld: 99 mg/dL (ref 65–99)
Potassium: 4 mmol/L (ref 3.5–5.3)
Sodium: 141 mmol/L (ref 135–146)
Total Bilirubin: 0.5 mg/dL (ref 0.2–1.2)
Total Protein: 7.1 g/dL (ref 6.1–8.1)

## 2018-11-13 LAB — TSH: TSH: 3.26 mIU/L (ref 0.40–4.50)

## 2018-11-13 LAB — LIPID PANEL
Cholesterol: 231 mg/dL — ABNORMAL HIGH (ref <200)
HDL: 51 mg/dL (ref >=50)
LDL Cholesterol (Calc): 148 mg/dL (calc) — ABNORMAL HIGH
Non-HDL Cholesterol (Calc): 180 mg/dL (calc) — ABNORMAL HIGH (ref <130)
Total CHOL/HDL Ratio: 4.5 (calc) (ref <5.0)
Triglycerides: 184 mg/dL — ABNORMAL HIGH (ref <150)

## 2018-11-14 ENCOUNTER — Encounter: Payer: Self-pay | Admitting: *Deleted

## 2018-12-11 ENCOUNTER — Other Ambulatory Visit: Payer: Self-pay | Admitting: Family Medicine

## 2018-12-11 DIAGNOSIS — I10 Essential (primary) hypertension: Secondary | ICD-10-CM

## 2019-01-27 DIAGNOSIS — N952 Postmenopausal atrophic vaginitis: Secondary | ICD-10-CM | POA: Diagnosis not present

## 2019-01-27 DIAGNOSIS — L290 Pruritus ani: Secondary | ICD-10-CM | POA: Diagnosis not present

## 2019-01-27 DIAGNOSIS — N898 Other specified noninflammatory disorders of vagina: Secondary | ICD-10-CM | POA: Diagnosis not present

## 2019-02-27 DIAGNOSIS — Z03818 Encounter for observation for suspected exposure to other biological agents ruled out: Secondary | ICD-10-CM | POA: Diagnosis not present

## 2019-02-27 DIAGNOSIS — Z20828 Contact with and (suspected) exposure to other viral communicable diseases: Secondary | ICD-10-CM | POA: Diagnosis not present

## 2019-03-09 ENCOUNTER — Telehealth: Payer: Self-pay | Admitting: Family Medicine

## 2019-03-09 NOTE — Telephone Encounter (Signed)
Patient called, stating that she needs a Physical due to work and also because she is a [employer], don't see any appropriate slots available. Is it ok to schedule this in December? Please Advise.

## 2019-03-09 NOTE — Telephone Encounter (Signed)
Okay to schedule in December

## 2019-03-09 NOTE — Telephone Encounter (Signed)
Physical appt scheduled for 04/01/19

## 2019-04-01 ENCOUNTER — Ambulatory Visit: Payer: BC Managed Care – PPO | Admitting: Family Medicine

## 2019-04-01 ENCOUNTER — Encounter: Payer: BC Managed Care – PPO | Admitting: Family Medicine

## 2019-04-07 ENCOUNTER — Other Ambulatory Visit: Payer: Self-pay | Admitting: Family Medicine

## 2019-04-07 DIAGNOSIS — I10 Essential (primary) hypertension: Secondary | ICD-10-CM

## 2019-04-16 ENCOUNTER — Other Ambulatory Visit: Payer: Self-pay

## 2019-04-16 ENCOUNTER — Encounter: Payer: Self-pay | Admitting: Family Medicine

## 2019-04-16 ENCOUNTER — Ambulatory Visit (INDEPENDENT_AMBULATORY_CARE_PROVIDER_SITE_OTHER): Payer: BC Managed Care – PPO | Admitting: Family Medicine

## 2019-04-16 VITALS — BP 133/66 | HR 69 | Ht 61.0 in | Wt 146.0 lb

## 2019-04-16 DIAGNOSIS — Z0000 Encounter for general adult medical examination without abnormal findings: Secondary | ICD-10-CM

## 2019-04-16 DIAGNOSIS — Z1231 Encounter for screening mammogram for malignant neoplasm of breast: Secondary | ICD-10-CM

## 2019-04-16 DIAGNOSIS — Z1211 Encounter for screening for malignant neoplasm of colon: Secondary | ICD-10-CM

## 2019-04-16 DIAGNOSIS — I10 Essential (primary) hypertension: Secondary | ICD-10-CM | POA: Diagnosis not present

## 2019-04-16 NOTE — Progress Notes (Signed)
Subjective:     Tina Meyer is a 58 y.o. female and is here for a comprehensive physical exam. The patient reports no problems. Stays active.  No regular exercise.    Social History   Socioeconomic History  . Marital status: Widowed    Spouse name: Not on file  . Number of children: 0  . Years of education: Not on file  . Highest education level: Not on file  Occupational History  . Occupation: dental office    Comment: [employer]  Tobacco Use  . Smoking status: Never Smoker  . Smokeless tobacco: Never Used  Substance and Sexual Activity  . Alcohol use: No    Alcohol/week: 0.0 standard drinks  . Drug use: No  . Sexual activity: Not Currently  Other Topics Concern  . Not on file  Social History Narrative   Had dogs.  Occ exercise.  1 caffeine drink per days.     Social Determinants of Health   Financial Resource Strain:   . Difficulty of Paying Living Expenses: Not on file  Food Insecurity:   . Worried About Charity fundraiser in the Last Year: Not on file  . Ran Out of Food in the Last Year: Not on file  Transportation Needs:   . Lack of Transportation (Medical): Not on file  . Lack of Transportation (Non-Medical): Not on file  Physical Activity:   . Days of Exercise per Week: Not on file  . Minutes of Exercise per Session: Not on file  Stress:   . Feeling of Stress : Not on file  Social Connections:   . Frequency of Communication with Friends and Family: Not on file  . Frequency of Social Gatherings with Friends and Family: Not on file  . Attends Religious Services: Not on file  . Active Member of Clubs or Organizations: Not on file  . Attends Archivist Meetings: Not on file  . Marital Status: Not on file  Intimate Partner Violence:   . Fear of Current or Ex-Partner: Not on file  . Emotionally Abused: Not on file  . Physically Abused: Not on file  . Sexually Abused: Not on file   Health Maintenance  Topic Date Due  .  COLONOSCOPY  07/30/2010  . MAMMOGRAM  02/16/2019  . TETANUS/TDAP  10/11/2022  . INFLUENZA VACCINE  Completed  . Hepatitis C Screening  Completed  . HIV Screening  Completed    The following portions of the patient's history were reviewed and updated as appropriate: allergies, current medications, past family history, past medical history, past social history, past surgical history and problem list.  Review of Systems A comprehensive review of systems was negative.   Objective:    BP 133/66   Pulse 69   Ht 5\' 1"  (1.549 m)   Wt 146 lb (66.2 kg)   SpO2 97%   BMI 27.59 kg/m  General appearance: alert, cooperative and appears stated age Head: Normocephalic, without obvious abnormality, atraumatic Eyes: conj clear, EOMI, PEERLA Ears: right TM blocked by cerumen.  Left TM and canal are normal Nose: Nares normal. Septum midline. Mucosa normal. No drainage or sinus tenderness. Throat: lips, mucosa, and tongue normal; teeth and gums normal Neck: no adenopathy, no carotid bruit, no JVD, supple, symmetrical, trachea midline and thyroid not enlarged, symmetric, no tenderness/mass/nodules Back: symmetric, no curvature. ROM normal. No CVA tenderness. Lungs: clear to auscultation bilaterally Breasts: normal appearance, no masses or tenderness Heart: regular rate and rhythm, S1,  S2 normal, no murmur, click, rub or gallop Abdomen: soft, non-tender; bowel sounds normal; no masses,  no organomegaly Pelvic: cervix normal in appearance, external genitalia normal, no adnexal masses or tenderness, no cervical motion tenderness, rectovaginal septum normal, uterus normal size, shape, and consistency and vagina normal without discharge Extremities: extremities normal, atraumatic, no cyanosis or edema Pulses: 2+ and symmetric Skin: Skin color, texture, turgor normal. No rashes or lesions Lymph nodes: Cervical, supraclavicular, and axillary nodes normal. Neurologic: Alert and oriented X 3, normal strength  and tone. Normal symmetric reflexes. Normal coordination and gait    Assessment:    Healthy female exam.      Plan:     See After Visit Summary for Counseling Recommendations   Keep up a regular exercise program and make sure you are eating a healthy diet Try to eat 4 servings of dairy a day, or if you are lactose intolerant take a calcium with vitamin D daily.  Your vaccines are up to date.  Mammogram and Cologuard ordered.  Cerumen impaction, right -irrigation performed.  Patient tolerated well.  Indication: Cerumen impaction of the ear(s)  Medical necessity statement: On physical examination, cerumen impairs clinically significant portions of the external auditory canal, and tympanic membrane. Noted obstructive, copious cerumen that cannot be removed without magnification and instrumentations requiring physician skills Consent: Discussed benefits and risks of procedure and verbal consent obtained Procedure: Patient was prepped for the procedure. Utilized an otoscope to assess and take note of the ear canal, the tympanic membrane, and the presence, amount, and placement of the cerumen. Gentle water irrigation and soft plastic curette was utilized to remove cerumen.  Post procedure examination: shows cerumen was completely removed. Patient tolerated procedure well. The patient is made aware that they may experience temporary vertigo, temporary hearing loss, and temporary discomfort. If these symptom last for more than 24 hours to call the clinic or proceed to the ED.

## 2019-04-16 NOTE — Patient Instructions (Signed)
Health Maintenance, Female Adopting a healthy lifestyle and getting preventive care are important in promoting health and wellness. Ask your health care provider about:  The right schedule for you to have regular tests and exams.  Things you can do on your own to prevent diseases and keep yourself healthy. What should I know about diet, weight, and exercise? Eat a healthy diet   Eat a diet that includes plenty of vegetables, fruits, low-fat dairy products, and lean protein.  Do not eat a lot of foods that are high in solid fats, added sugars, or sodium. Maintain a healthy weight Body mass index (BMI) is used to identify weight problems. It estimates body fat based on height and weight. Your health care provider can help determine your BMI and help you achieve or maintain a healthy weight. Get regular exercise Get regular exercise. This is one of the most important things you can do for your health. Most adults should:  Exercise for at least 150 minutes each week. The exercise should increase your heart rate and make you sweat (moderate-intensity exercise).  Do strengthening exercises at least twice a week. This is in addition to the moderate-intensity exercise.  Spend less time sitting. Even light physical activity can be beneficial. Watch cholesterol and blood lipids Have your blood tested for lipids and cholesterol at 58 years of age, then have this test every 5 years. Have your cholesterol levels checked more often if:  Your lipid or cholesterol levels are high.  You are older than 58 years of age.  You are at high risk for heart disease. What should I know about cancer screening? Depending on your health history and family history, you may need to have cancer screening at various ages. This may include screening for:  Breast cancer.  Cervical cancer.  Colorectal cancer.  Skin cancer.  Lung cancer. What should I know about heart disease, diabetes, and high blood  pressure? Blood pressure and heart disease  High blood pressure causes heart disease and increases the risk of stroke. This is more likely to develop in people who have high blood pressure readings, are of African descent, or are overweight.  Have your blood pressure checked: ? Every 3-5 years if you are 18-39 years of age. ? Every year if you are 40 years old or older. Diabetes Have regular diabetes screenings. This checks your fasting blood sugar level. Have the screening done:  Once every three years after age 40 if you are at a normal weight and have a low risk for diabetes.  More often and at a younger age if you are overweight or have a high risk for diabetes. What should I know about preventing infection? Hepatitis B If you have a higher risk for hepatitis B, you should be screened for this virus. Talk with your health care provider to find out if you are at risk for hepatitis B infection. Hepatitis C Testing is recommended for:  Everyone born from 1945 through 1965.  Anyone with known risk factors for hepatitis C. Sexually transmitted infections (STIs)  Get screened for STIs, including gonorrhea and chlamydia, if: ? You are sexually active and are younger than 58 years of age. ? You are older than 58 years of age and your health care provider tells you that you are at risk for this type of infection. ? Your sexual activity has changed since you were last screened, and you are at increased risk for chlamydia or gonorrhea. Ask your health care provider if   you are at risk.  Ask your health care provider about whether you are at high risk for HIV. Your health care provider may recommend a prescription medicine to help prevent HIV infection. If you choose to take medicine to prevent HIV, you should first get tested for HIV. You should then be tested every 3 months for as long as you are taking the medicine. Pregnancy  If you are about to stop having your period (premenopausal) and  you may become pregnant, seek counseling before you get pregnant.  Take 400 to 800 micrograms (mcg) of folic acid every day if you become pregnant.  Ask for birth control (contraception) if you want to prevent pregnancy. Osteoporosis and menopause Osteoporosis is a disease in which the bones lose minerals and strength with aging. This can result in bone fractures. If you are 65 years old or older, or if you are at risk for osteoporosis and fractures, ask your health care provider if you should:  Be screened for bone loss.  Take a calcium or vitamin D supplement to lower your risk of fractures.  Be given hormone replacement therapy (HRT) to treat symptoms of menopause. Follow these instructions at home: Lifestyle  Do not use any products that contain nicotine or tobacco, such as cigarettes, e-cigarettes, and chewing tobacco. If you need help quitting, ask your health care provider.  Do not use street drugs.  Do not share needles.  Ask your health care provider for help if you need support or information about quitting drugs. Alcohol use  Do not drink alcohol if: ? Your health care provider tells you not to drink. ? You are pregnant, may be pregnant, or are planning to become pregnant.  If you drink alcohol: ? Limit how much you use to 0-1 drink a day. ? Limit intake if you are breastfeeding.  Be aware of how much alcohol is in your drink. In the [place] one drink equals one 12 oz bottle of beer (355 mL), one 5 oz glass of wine (148 mL), or one 1 oz glass of hard liquor (44 mL). General instructions  Schedule regular health, dental, and eye exams.  Stay current with your vaccines.  Tell your health care provider if: ? You often feel depressed. ? You have ever been abused or do not feel safe at home. Summary  Adopting a healthy lifestyle and getting preventive care are important in promoting health and wellness.  Follow your health care provider's instructions about healthy  diet, exercising, and getting tested or screened for diseases.  Follow your health care provider's instructions on monitoring your cholesterol and blood pressure. This information is not intended to replace advice given to you by your health care provider. Make sure you discuss any questions you have with your health care provider. Document Revised: 03/26/2018 Document Reviewed: 03/26/2018 Elsevier Patient Education  2020 Elsevier Inc.  

## 2019-05-11 ENCOUNTER — Other Ambulatory Visit: Payer: Self-pay | Admitting: Family Medicine

## 2019-05-11 DIAGNOSIS — R0981 Nasal congestion: Secondary | ICD-10-CM | POA: Diagnosis not present

## 2019-05-11 DIAGNOSIS — R05 Cough: Secondary | ICD-10-CM | POA: Diagnosis not present

## 2019-05-11 DIAGNOSIS — R52 Pain, unspecified: Secondary | ICD-10-CM | POA: Diagnosis not present

## 2019-05-11 DIAGNOSIS — Z20822 Contact with and (suspected) exposure to covid-19: Secondary | ICD-10-CM | POA: Diagnosis not present

## 2019-05-13 ENCOUNTER — Telehealth (INDEPENDENT_AMBULATORY_CARE_PROVIDER_SITE_OTHER): Payer: BC Managed Care – PPO | Admitting: Family Medicine

## 2019-05-13 ENCOUNTER — Encounter: Payer: Self-pay | Admitting: Family Medicine

## 2019-05-13 VITALS — BP 112/64 | Ht 61.0 in | Wt 146.0 lb

## 2019-05-13 DIAGNOSIS — J019 Acute sinusitis, unspecified: Secondary | ICD-10-CM | POA: Diagnosis not present

## 2019-05-13 MED ORDER — HYDROCODONE-HOMATROPINE 5-1.5 MG/5ML PO SYRP: 5.0000 mL | ORAL_SOLUTION | Freq: Every evening | ORAL | 0 refills | Status: DC | PRN

## 2019-05-13 MED ORDER — AMOXICILLIN-POT CLAVULANATE 875-125 MG PO TABS: 1.0000 | ORAL_TABLET | Freq: Two times a day (BID) | ORAL | 0 refills | Status: DC

## 2019-05-13 MED ORDER — PREDNISONE 20 MG PO TABS: 20.0000 mg | ORAL_TABLET | Freq: Every day | ORAL | 0 refills | Status: DC

## 2019-05-13 NOTE — Progress Notes (Signed)
Pt reports that her sxs began 2 wks ago. C/O pressure around eyes and cheeks, cough has gotten worse and she feels that it is moving into her chest.   She was seen at [facility] on Monday and had a COVID test done this was NEG. She was advised to stay home until Monday. Pt would like

## 2019-05-13 NOTE — Progress Notes (Addendum)
Virtual Visit via Video Note  I connected with Tina Meyer on 05/13/19 at 10:50 AM EST by a video enabled telemedicine application and verified that I am speaking with the correct person using two identifiers.   I discussed the limitations of evaluation and management by telemedicine and the availability of in person appointments. The patient expressed understanding and agreed to proceed.  Subjective:    CC: sinus sxs.    HPI: Pt reports that her sxs began 2 wks ago. C/O pressure around eyes and cheeks, cough has gotten worse and she feels that it is moving into her chest. No fever, chills or sweats.  Cough is occ productive.  No SOB.  Very congestion and pressure in her face got worse this AM and in her forehead.  Her left cheek is actually tender to touch and now her teeth are hurting. She was seen at [facility] on Monday and had a COVID test done this was NEG. She was advised to stay home until Monday.  No fever, chills or sweats.  No loss of taste or smell.  No GI symptoms.   Past medical history, Surgical history, Family history not pertinant except as noted below, Social history, Allergies, and medications have been entered into the medical record, reviewed, and corrections made.   Review of Systems: No fevers, chills, night sweats, weight loss, chest pain, or shortness of breath.   Objective:    General: Speaking clearly in complete sentences without any shortness of breath.  Alert and oriented x3.  Normal judgment. No apparent acute distress.    Impression and Recommendations:   Acute sinusitis-we will treat with Augmentin and low-dose prednisone.  We will also send over a small amount of nighttime cough syrup.  If not feeling better after the weekend and please give [place] call back.  Work note provided as well.  Okay to return on Monday.  Time spent 18 minutes in encounter.  I discussed the assessment and treatment plan with the patient. The patient was provided an  opportunity to ask questions and all were answered. The patient agreed with the plan and demonstrated an understanding of the instructions.   The patient was advised to call back or seek an in-person evaluation if the symptoms worsen or if the condition fails to improve as anticipated.   Beatrice Lecher, MD

## 2019-06-09 ENCOUNTER — Other Ambulatory Visit: Payer: Self-pay | Admitting: Family Medicine

## 2019-06-10 ENCOUNTER — Telehealth: Payer: Self-pay

## 2019-06-10 NOTE — Telephone Encounter (Signed)
Pt called requesting an update on her physical form. Pt is requesting to pick up form by tomorrow / no later than Friday. Pls contact pt with an update. Thanks.

## 2019-06-11 NOTE — Telephone Encounter (Signed)
Called to inform pt that her form has been completed. her vm is full. Will try again later.   Form copied and sent to scanning.Tina Meyer, [place]

## 2019-06-18 NOTE — Telephone Encounter (Signed)
vm is full.Tina Meyer, CMA  

## 2019-06-22 NOTE — Telephone Encounter (Signed)
Pt came by and p/u forms.Tina Meyer, [place]

## 2019-07-01 ENCOUNTER — Telehealth: Payer: Self-pay

## 2019-07-01 NOTE — Telephone Encounter (Signed)
Yes needs visit.  Probably virtual visit if she has a significant cough.  Though she does have underlying asthma so it could just be related to that.  And if she had a noted good of Covid test at the end of January.

## 2019-07-01 NOTE — Telephone Encounter (Signed)
Pt called stating that her cough has worsened and is "profound", keeping her awake at night and believes that she is wheezing in the right side of her chest.  Please advise if pt needs OV vs telehealth, etc.  Charyl Bigger, CMA

## 2019-07-02 ENCOUNTER — Telehealth: Payer: BC Managed Care – PPO | Admitting: Family Medicine

## 2019-07-02 NOTE — Telephone Encounter (Signed)
Me and Dom attempted multiple calls with both numbers on file. No answer. Full VM

## 2019-07-02 NOTE — Telephone Encounter (Signed)
Please call and schedule virtual 

## 2019-07-09 ENCOUNTER — Telehealth (INDEPENDENT_AMBULATORY_CARE_PROVIDER_SITE_OTHER): Payer: BC Managed Care – PPO | Admitting: Medical-Surgical

## 2019-07-09 ENCOUNTER — Encounter: Payer: Self-pay | Admitting: Medical-Surgical

## 2019-07-09 ENCOUNTER — Other Ambulatory Visit: Payer: Self-pay | Admitting: Family Medicine

## 2019-07-09 DIAGNOSIS — J40 Bronchitis, not specified as acute or chronic: Secondary | ICD-10-CM

## 2019-07-09 DIAGNOSIS — R062 Wheezing: Secondary | ICD-10-CM | POA: Diagnosis not present

## 2019-07-09 DIAGNOSIS — R05 Cough: Secondary | ICD-10-CM | POA: Diagnosis not present

## 2019-07-09 DIAGNOSIS — J329 Chronic sinusitis, unspecified: Secondary | ICD-10-CM | POA: Diagnosis not present

## 2019-07-09 MED ORDER — AZITHROMYCIN 250 MG PO TABS: ORAL_TABLET | ORAL | 0 refills | Status: DC

## 2019-07-09 MED ORDER — BENZONATATE 100 MG PO CAPS: 100.0000 mg | ORAL_CAPSULE | Freq: Three times a day (TID) | ORAL | 0 refills | Status: DC | PRN

## 2019-07-09 MED ORDER — HYDROCODONE-HOMATROPINE 5-1.5 MG/5ML PO SYRP: 5.0000 mL | ORAL_SOLUTION | Freq: Every evening | ORAL | 0 refills | Status: DC | PRN

## 2019-07-09 MED ORDER — BENZONATATE 100 MG PO CAPS: 100.0000 mg | ORAL_CAPSULE | Freq: Three times a day (TID) | ORAL | 0 refills | Status: AC | PRN

## 2019-07-09 MED ORDER — ALBUTEROL SULFATE HFA 108 (90 BASE) MCG/ACT IN AERS: 2.0000 | INHALATION_SPRAY | Freq: Four times a day (QID) | RESPIRATORY_TRACT | 2 refills | Status: DC | PRN

## 2019-07-09 MED ORDER — CEFDINIR 300 MG PO CAPS: 300.0000 mg | ORAL_CAPSULE | Freq: Two times a day (BID) | ORAL | 0 refills | Status: DC

## 2019-07-09 NOTE — Progress Notes (Addendum)
Virtual Visit via Video Note  I connected with Tina Meyer on 07/09/19 at  1:20 PM EDT by a video enabled telemedicine application and verified that I am speaking with the correct person using two identifiers.   I discussed the limitations of evaluation and management by telemedicine and the availability of in person appointments. The patient expressed understanding and agreed to proceed.  Subjective:    CC: Worsening cough with upper respiratory symptoms  HPI: Pleasant 59 year old female presenting via [employer] video visit for worsening cough.  Reports being sick for approximately the last 3 months.  Was treated in January for sinusitis with Augmentin and low-dose prednisone.  Mild improvement but no resolution in symptoms.  Since then her cough has become deep and productive of green-brown mucus.  Reports feeling increased chest congestion and difficulty sleeping due to coughing.  Has intermittent headache, fatigue.  + Sinus congestion, facial pain and pressure, ear fullness, rhinorrhea, postnasal drip, and sore throat.  Has noticed mild wheezing in the afternoons and reports chest feels tight.  Has not tried any nasal sprays.  Has albuterol inhaler on her med list but does not have any available at home or any refills.  Tried OTC Sudafed with minimal relief.  Denies chest pain, fever.  Past medical history, Surgical history, Family history not pertinant except as noted below, Social history, Allergies, and medications have been entered into the medical record, reviewed, and corrections made.   Review of Systems: No fevers, chills, night sweats, weight loss, chest pain, or shortness of breath.   Objective:    General: Speaking clearly in complete sentences without any shortness of breath.  Alert and oriented x3.  Normal judgment. No apparent acute distress.  Impression and Recommendations:    1. Sinobronchitis Unclear etiology and limited exam with virtual nature of visit.   Sinusitis, bronchitis, atypical pneumonia, and community-acquired pneumonia all in the differential.  Chest x-ray today.  With severity of cough, chest congestion, and chest tightness along with history of asthma, treating empirically with azithromycin and cefdinir.  Hycodan cough syrup for nighttime cough relief.  Tessalon Perles for use during the day for cough.  2. Wheezing Albuterol inhaler refills provided.  I discussed the assessment and treatment plan with the patient. The patient was provided an opportunity to ask questions and all were answered. The patient agreed with the plan and demonstrated an understanding of the instructions.   The patient was advised to call back or seek an in-person evaluation if the symptoms worsen or if the condition fails to improve as anticipated.  Return if symptoms worsen or fail to improve.  25 minutes of non-face-to-face time was provided during this encounter.  Clearnce Sorrel, DNP, APRN, FNP-BC [facility]

## 2019-07-09 NOTE — Addendum Note (Signed)
Addended bySamuel Bouche on: 07/09/2019 03:23 PM   Modules accepted: Level of Service

## 2019-07-10 ENCOUNTER — Ambulatory Visit (INDEPENDENT_AMBULATORY_CARE_PROVIDER_SITE_OTHER): Payer: BC Managed Care – PPO

## 2019-07-10 ENCOUNTER — Other Ambulatory Visit: Payer: Self-pay

## 2019-07-10 DIAGNOSIS — R053 Chronic cough: Secondary | ICD-10-CM

## 2019-07-10 DIAGNOSIS — R05 Cough: Secondary | ICD-10-CM

## 2019-07-15 ENCOUNTER — Encounter: Payer: Self-pay | Admitting: Neurology

## 2019-08-29 ENCOUNTER — Other Ambulatory Visit: Payer: Self-pay | Admitting: Family Medicine

## 2019-09-30 ENCOUNTER — Other Ambulatory Visit: Payer: Self-pay | Admitting: *Deleted

## 2019-10-14 ENCOUNTER — Ambulatory Visit: Payer: BC Managed Care – PPO | Admitting: Family Medicine

## 2019-11-02 ENCOUNTER — Encounter: Payer: Self-pay | Admitting: Family Medicine

## 2019-11-06 ENCOUNTER — Other Ambulatory Visit: Payer: Self-pay | Admitting: Family Medicine

## 2019-11-20 ENCOUNTER — Other Ambulatory Visit: Payer: Self-pay | Admitting: Family Medicine

## 2019-11-20 DIAGNOSIS — I10 Essential (primary) hypertension: Secondary | ICD-10-CM

## 2019-11-20 NOTE — Telephone Encounter (Signed)
Called pt and LVM advising her that she is overdue for f/u on BP and will also need labs.

## 2019-12-03 ENCOUNTER — Other Ambulatory Visit: Payer: Self-pay | Admitting: Family Medicine

## 2019-12-03 DIAGNOSIS — I10 Essential (primary) hypertension: Secondary | ICD-10-CM

## 2019-12-10 ENCOUNTER — Ambulatory Visit: Payer: BC Managed Care – PPO

## 2019-12-10 ENCOUNTER — Other Ambulatory Visit: Payer: Self-pay

## 2019-12-10 ENCOUNTER — Ambulatory Visit (INDEPENDENT_AMBULATORY_CARE_PROVIDER_SITE_OTHER): Payer: BC Managed Care – PPO | Admitting: Family Medicine

## 2019-12-10 ENCOUNTER — Encounter: Payer: Self-pay | Admitting: Family Medicine

## 2019-12-10 VITALS — BP 120/65 | HR 87 | Ht 61.0 in | Wt 146.0 lb

## 2019-12-10 DIAGNOSIS — F5101 Primary insomnia: Secondary | ICD-10-CM

## 2019-12-10 DIAGNOSIS — E7849 Other hyperlipidemia: Secondary | ICD-10-CM

## 2019-12-10 DIAGNOSIS — J0100 Acute maxillary sinusitis, unspecified: Secondary | ICD-10-CM

## 2019-12-10 DIAGNOSIS — Z1231 Encounter for screening mammogram for malignant neoplasm of breast: Secondary | ICD-10-CM | POA: Diagnosis not present

## 2019-12-10 DIAGNOSIS — I10 Essential (primary) hypertension: Secondary | ICD-10-CM

## 2019-12-10 MED ORDER — BENZONATATE 200 MG PO CAPS
200.0000 mg | ORAL_CAPSULE | Freq: Three times a day (TID) | ORAL | 0 refills | Status: DC | PRN
Start: 2019-12-10 — End: 2020-02-05

## 2019-12-10 MED ORDER — AZITHROMYCIN 250 MG PO TABS: ORAL_TABLET | ORAL | 0 refills | Status: AC

## 2019-12-10 MED ORDER — FLUCONAZOLE 100 MG PO TABS: 100.0000 mg | ORAL_TABLET | Freq: Every day | ORAL | 0 refills | Status: DC

## 2019-12-10 NOTE — Progress Notes (Signed)
Established Patient Office Visit  Subjective:  Patient ID: Tina Meyer, female    DOB: [birth year]  Age: 59 y.o. MRN: [id number]  CC:  Chief Complaint  Patient presents with   Hypertension    HPI Tina Meyer presents for   Hypertension- Pt denies chest pain, SOB, dizziness, or heart palpitations.  Taking meds as directed w/o problems.  Denies medication side effects.    She also reports that for about 2 weeks she has been having a lot of sinus symptoms she has been getting a lot of nasal discharge that is green she is having a lot of pressure in the bilateral facial cheek area.  No fevers chills or sweats.  She has been relying on Sudafed..  Hyperlipidemia - tolerating stating well with no myalgias or significant side effects.  Lab Results  Component Value Date   CHOL 231 (H) 11/12/2018   HDL 51 11/12/2018   LDLCALC 148 (H) 11/12/2018   TRIG 184 (H) 11/12/2018   CHOLHDL 4.5 11/12/2018       Past Medical History:  Diagnosis Date   Depression    Hyperlipidemia    Hypertension     Past Surgical History:  Procedure Laterality Date   ABDOMINAL HYSTERECTOMY  2004    Family History  Problem Relation Age of Onset   Breast cancer Maternal Aunt    Heart disease Maternal Aunt     Social History   Socioeconomic History   Marital status: Widowed    Spouse name: Not on file   Number of children: 0   Years of education: Not on file   Highest education level: Not on file  Occupational History   Occupation: dental office    Comment: [employer]  Tobacco Use   Smoking status: Never Smoker   Smokeless tobacco: Never Used  Substance and Sexual Activity   Alcohol use: No    Alcohol/week: 0.0 standard drinks   Drug use: No   Sexual activity: Not Currently  Other Topics Concern   Not on file  Social History Narrative   Had dogs.  Occ exercise.  1 caffeine drink per days.     Social Determinants of Health    Financial Resource Strain:    Difficulty of Paying Living Expenses: Not on file  Food Insecurity:    Worried About Charity fundraiser in the Last Year: Not on file   [employer] of Food in the Last Year: Not on file  Transportation Needs:    Lack of Transportation (Medical): Not on file   Lack of Transportation (Non-Medical): Not on file  Physical Activity:    Days of Exercise per Week: Not on file   Minutes of Exercise per Session: Not on file  Stress:    Feeling of Stress : Not on file  Social Connections:    Frequency of Communication with Friends and Family: Not on file   Frequency of Social Gatherings with Friends and Family: Not on file   Attends Religious Services: Not on file   Active Member of Clubs or Organizations: Not on file   Attends Archivist Meetings: Not on file   Marital Status: Not on file  Intimate Partner Violence:    Fear of Current or Ex-Partner: Not on file   Emotionally Abused: Not on file   Physically Abused: Not on file   Sexually Abused: Not on file    Outpatient Medications Prior to Visit  Medication Sig Dispense Refill  albuterol (VENTOLIN HFA) 108 (90 Base) MCG/ACT inhaler Inhale 2 puffs into the lungs every 6 (six) hours as needed for wheezing. 8 g 2   ALPRAZolam (XANAX) 0.5 MG tablet TAKE 1 TABLET (0.5 MG TOTAL) BY MOUTH AT BEDTIME AS NEEDED FOR ANXIETY. TRY NOT TO TAKE EVERY NIGHT. 30 tablet 0   atorvastatin (LIPITOR) 40 MG tablet TAKE 1 TABLET BY MOUTH EVERY DAY 30 tablet 6   candesartan-hydrochlorothiazide (ATACAND HCT) 16-12.5 MG tablet Take 1 tablet by mouth daily. 90 tablet 1   estradiol (ESTRACE) 0.1 MG/GM vaginal cream 1g vaginally nightly for two week then 3 times a week thereafter.     fluticasone (FLONASE) 50 MCG/ACT nasal spray SPRAY 2 SPRAYS INTO EACH NOSTRIL EVERY DAY 16 g 3   fluvoxaMINE (LUVOX) 100 MG tablet TAKE 1 TABLET BY MOUTH EVERY DAY 90 tablet 1   traZODone (DESYREL) 50 MG tablet TAKE  ONE-HALF (1/2) TO ONE TABLET DAILY 90 tablet 3   No facility-administered medications prior to visit.    Allergies  Allergen Reactions   Lisinopril Anaphylaxis    REACTION: anaphalactic   Aspirin Nausea Only    REACTION: nausea   Losartan Other (See Comments)    Hair Loss   Rosuvastatin     REACTION: myalgias    ROS Review of Systems    Objective:    Physical Exam Constitutional:      Appearance: She is well-developed.  HENT:     Head: Normocephalic and atraumatic.     Right Ear: Tympanic membrane, ear canal and external ear normal.     Left Ear: Tympanic membrane, ear canal and external ear normal.     Nose: Nose normal.  Eyes:     Conjunctiva/sclera: Conjunctivae normal.     Pupils: Pupils are equal, round, and reactive to light.  Neck:     Thyroid: No thyromegaly.  Cardiovascular:     Rate and Rhythm: Normal rate and regular rhythm.     Heart sounds: Normal heart sounds.  Pulmonary:     Effort: Pulmonary effort is normal.     Breath sounds: Normal breath sounds. No wheezing.  Musculoskeletal:     Cervical back: Neck supple.  Lymphadenopathy:     Cervical: No cervical adenopathy.  Skin:    General: Skin is warm and dry.  Neurological:     Mental Status: She is alert and oriented to person, place, and time.  Psychiatric:        Behavior: Behavior normal.     BP 120/65    Pulse 87    Ht 5\' 1"  (1.549 m)    Wt 146 lb (66.2 kg)    SpO2 97%    BMI 27.59 kg/m  Wt Readings from Last 3 Encounters:  12/10/19 146 lb (66.2 kg)  05/13/19 146 lb (66.2 kg)  04/16/19 146 lb (66.2 kg)     Health Maintenance Due  Topic Date Due   COLONOSCOPY  Never done   MAMMOGRAM  02/16/2019   INFLUENZA VACCINE  11/15/2019    There are no preventive care reminders to display for this patient.  Lab Results  Component Value Date   TSH 3.26 11/12/2018   Lab Results  Component Value Date   WBC 6.1 11/12/2018   HGB 12.2 11/12/2018   HCT 37.2 11/12/2018   MCV 82.1  11/12/2018   PLT 315 11/12/2018   Lab Results  Component Value Date   NA 141 11/12/2018   K 4.0 11/12/2018   CO2 28  11/12/2018   GLUCOSE 99 11/12/2018   BUN 19 11/12/2018   CREATININE 0.96 11/12/2018   BILITOT 0.5 11/12/2018   ALKPHOS 77 05/23/2015   AST 21 11/12/2018   ALT 16 11/12/2018   PROT 7.1 11/12/2018   ALBUMIN 4.1 05/23/2015   CALCIUM 9.9 11/12/2018   Lab Results  Component Value Date   CHOL 231 (H) 11/12/2018   Lab Results  Component Value Date   HDL 51 11/12/2018   Lab Results  Component Value Date   LDLCALC 148 (H) 11/12/2018   Lab Results  Component Value Date   TRIG 184 (H) 11/12/2018   Lab Results  Component Value Date   CHOLHDL 4.5 11/12/2018   No results found for: HGBA1C    Assessment & Plan:   Problem List Items Addressed This Visit      Cardiovascular and Mediastinum   HYPERTENSION, BENIGN - Primary    Well controlled. Continue current regimen. Follow up in  6 months.  We actually just refilled her medication before her appointment so that she did not run out.      Relevant Orders   CBC   COMPLETE METABOLIC PANEL WITH GFR   Lipid panel   Hemoglobin A1c     Other   INSOMNIA    Still mostly relying on trazodone for sleep pretty regularly.  No side effects of sedation etc.  Does not need refills today.      Hyperlipidemia    Due to recheck lipids.  Tolerating atorvastatin well without any significant problems. hopefully, LDL is much improved.      Relevant Orders   CBC   COMPLETE METABOLIC PANEL WITH GFR   Lipid panel   Hemoglobin A1c    Other Visit Diagnoses    Visit for screening mammogram       Relevant Orders   MM 3D SCREEN BREAST BILATERAL   Acute maxillary sinusitis, recurrence not specified       Relevant Medications   fluconazole (DIFLUCAN) 100 MG tablet   azithromycin (ZITHROMAX) 250 MG tablet   benzonatate (TESSALON) 200 MG capsule     Acute maxillary sinusitis-we will go ahead and treat with azithromycin  since she is had symptoms x2 weeks.  Also sent over [facility] for cough from postnasal drip that her chest is clear.  If not better in 1 week please give [place] call back.  She also asked that we send over Diflucan as she often gets yeast infections with antibiotics.  Meds ordered this encounter  Medications   fluconazole (DIFLUCAN) 100 MG tablet    Sig: Take 1 tablet (100 mg total) by mouth daily.    Dispense:  1 tablet    Refill:  0   azithromycin (ZITHROMAX) 250 MG tablet    Sig: 2 Ttabs PO on Day 1, then one a day x 4 days.    Dispense:  6 tablet    Refill:  0   benzonatate (TESSALON) 200 MG capsule    Sig: Take 1 capsule (200 mg total) by mouth 3 (three) times daily as needed for cough.    Dispense:  30 capsule    Refill:  0    Follow-up: No follow-ups on file.    Beatrice Lecher, MD

## 2019-12-10 NOTE — Assessment & Plan Note (Addendum)
Well controlled. Continue current regimen. Follow up in  6 months.  We actually just refilled her medication before her appointment so that she did not run out.

## 2019-12-11 ENCOUNTER — Encounter: Payer: Self-pay | Admitting: Family Medicine

## 2019-12-11 DIAGNOSIS — I10 Essential (primary) hypertension: Secondary | ICD-10-CM | POA: Diagnosis not present

## 2019-12-11 DIAGNOSIS — E7849 Other hyperlipidemia: Secondary | ICD-10-CM | POA: Diagnosis not present

## 2019-12-11 NOTE — Assessment & Plan Note (Signed)
Still mostly relying on trazodone for sleep pretty regularly.  No side effects of sedation etc.  Does not need refills today.

## 2019-12-11 NOTE — Assessment & Plan Note (Signed)
Due to recheck lipids.  Tolerating atorvastatin well without any significant problems. hopefully, LDL is much improved.

## 2019-12-12 LAB — LIPID PANEL
Cholesterol: 310 mg/dL — ABNORMAL HIGH (ref <200)
HDL: 42 mg/dL — ABNORMAL LOW (ref >=50)
LDL Cholesterol (Calc): 218 mg/dL (calc) — ABNORMAL HIGH
Non-HDL Cholesterol (Calc): 268 mg/dL (calc) — ABNORMAL HIGH (ref <130)
Total CHOL/HDL Ratio: 7.4 (calc) — ABNORMAL HIGH (ref <5.0)
Triglycerides: 297 mg/dL — ABNORMAL HIGH (ref <150)

## 2019-12-12 LAB — CBC
HCT: 38.1 % (ref 35.0–45.0)
Hemoglobin: 12.5 g/dL (ref 11.7–15.5)
MCH: 26.9 pg — ABNORMAL LOW (ref 27.0–33.0)
MCHC: 32.8 g/dL (ref 32.0–36.0)
MCV: 82.1 fL (ref 80.0–100.0)
MPV: 9.3 fL (ref 7.5–12.5)
Platelets: 438 10*3/uL — ABNORMAL HIGH (ref 140–400)
RBC: 4.64 10*6/uL (ref 3.80–5.10)
RDW: 13.1 % (ref 11.0–15.0)
WBC: 8.1 10*3/uL (ref 3.8–10.8)

## 2019-12-12 LAB — HEMOGLOBIN A1C
Hgb A1c MFr Bld: 5.7 % of total Hgb — ABNORMAL HIGH (ref <5.7)
Mean Plasma Glucose: 117 (calc)
eAG (mmol/L): 6.5 (calc)

## 2019-12-12 LAB — COMPLETE METABOLIC PANEL WITH GFR
AG Ratio: 1.7 (calc) (ref 1.0–2.5)
ALT: 10 U/L (ref 6–29)
AST: 14 U/L (ref 10–35)
Albumin: 4.5 g/dL (ref 3.6–5.1)
Alkaline phosphatase (APISO): 70 U/L (ref 37–153)
BUN: 21 mg/dL (ref 7–25)
CO2: 29 mmol/L (ref 20–32)
Calcium: 9.8 mg/dL (ref 8.6–10.4)
Chloride: 103 mmol/L (ref 98–110)
Creat: 1.01 mg/dL (ref 0.50–1.05)
GFR, Est African American: 71 mL/min/{1.73_m2} (ref >=60)
GFR, Est Non African American: 61 mL/min/{1.73_m2} (ref >=60)
Globulin: 2.6 g/dL (calc) (ref 1.9–3.7)
Glucose, Bld: 93 mg/dL (ref 65–99)
Potassium: 4.3 mmol/L (ref 3.5–5.3)
Sodium: 141 mmol/L (ref 135–146)
Total Bilirubin: 0.3 mg/dL (ref 0.2–1.2)
Total Protein: 7.1 g/dL (ref 6.1–8.1)

## 2019-12-14 ENCOUNTER — Encounter: Payer: Self-pay | Admitting: Family Medicine

## 2019-12-14 DIAGNOSIS — N182 Chronic kidney disease, stage 2 (mild): Secondary | ICD-10-CM | POA: Insufficient documentation

## 2019-12-14 DIAGNOSIS — R7301 Impaired fasting glucose: Secondary | ICD-10-CM | POA: Insufficient documentation

## 2019-12-30 ENCOUNTER — Telehealth: Payer: Self-pay

## 2019-12-30 NOTE — Telephone Encounter (Signed)
Patient called stating that she received her second COVID vaccine and is experiencing redness, swelling and warmth int he location of injection. She states that there is some swelling under her armpit. I asked patient to attache a photo and send it through [place] but she was unable to do so.   Called to discuss with patient but no answer and VM full. Sent MyChart msg requesting some additional info

## 2020-01-11 ENCOUNTER — Other Ambulatory Visit: Payer: Self-pay | Admitting: *Deleted

## 2020-01-11 DIAGNOSIS — E7849 Other hyperlipidemia: Secondary | ICD-10-CM

## 2020-01-11 MED ORDER — ATORVASTATIN CALCIUM 40 MG PO TABS: 40.0000 mg | ORAL_TABLET | Freq: Every day | ORAL | 11 refills | Status: DC

## 2020-01-27 ENCOUNTER — Ambulatory Visit: Payer: BC Managed Care – PPO

## 2020-02-05 ENCOUNTER — Emergency Department
Admission: EM | Admit: 2020-02-05 | Discharge: 2020-02-05 | Disposition: A | Discharge status: Home / Self Care | Payer: BC Managed Care – PPO | Source: Home / Self Care

## 2020-02-05 ENCOUNTER — Other Ambulatory Visit: Payer: Self-pay

## 2020-02-05 ENCOUNTER — Encounter: Payer: Self-pay | Admitting: Family Medicine

## 2020-02-05 DIAGNOSIS — J069 Acute upper respiratory infection, unspecified: Secondary | ICD-10-CM | POA: Diagnosis not present

## 2020-02-05 MED ORDER — AMOXICILLIN-POT CLAVULANATE 875-125 MG PO TABS: 1.0000 | ORAL_TABLET | Freq: Two times a day (BID) | ORAL | 0 refills | Status: DC

## 2020-02-05 MED ORDER — BENZONATATE 100 MG PO CAPS: 100.0000 mg | ORAL_CAPSULE | Freq: Three times a day (TID) | ORAL | 0 refills | Status: DC

## 2020-02-05 MED ORDER — IPRATROPIUM BROMIDE 0.06 % NA SOLN: 2.0000 | Freq: Four times a day (QID) | NASAL | 1 refills | Status: DC

## 2020-02-05 NOTE — ED Triage Notes (Signed)
Patient presents to Urgent Care with complaints of sore throat and cough since a little over a week ago. Patient reports her cough has been so bad recently it has made her throat sore. Feels like her cough is deep in her chest and she produces green mucous. Has been taking advil and tylenol for her sx, has not taken tylenol today. Does not think she has had a fever. Pt has been vaccinated for covid, has not been tested recently but has not been around anyone w/ suspected covid.

## 2020-02-05 NOTE — ED Provider Notes (Signed)
[name]    CSN: [id number] Arrival date & time: 02/05/20  1527      History   Chief Complaint Chief Complaint  Patient presents with  . Sore Throat  . Cough    HPI Tina Meyer is a 59 y.o. female.   HPI  Tina Meyer is a 59 y.o. female presenting to UC with c/o sore throat and cough for about 1-2weeks. Cough has caused throat to worsen.  She has been coughing up green sputum recently.  She has taken Advil and Tylenol but no medication taken today.  Denies fever, chills, n/v/d. Fully vaccinated with COVID. No known sick contacts. No chest pain or SOB  Past Medical History:  Diagnosis Date  . Depression   . Hyperlipidemia   . Hypertension     Patient Active Problem List   Diagnosis Date Noted  . IFG (impaired fasting glucose) 12/14/2019  . CKD (chronic kidney disease) stage 2, GFR 60-89 ml/min 12/14/2019  . Mild aortic sclerosis 01/30/2016  . Heart murmur 08/07/2011  . ALLERGIC PURPURA 12/19/2010  . DERMATITIS, ATOPIC 12/19/2010  . Hyperlipidemia 05/27/2008  . ANXIETY DEPRESSION 05/27/2008  . HYPERTENSION, BENIGN 05/27/2008  . INSOMNIA 05/27/2008    Past Surgical History:  Procedure Laterality Date  . ABDOMINAL HYSTERECTOMY  2004    OB History   No obstetric history on file.      Home Medications    Prior to Admission medications   Medication Sig Start Date End Date Taking? Authorizing Provider  ALPRAZolam (XANAX) 0.5 MG tablet TAKE 1 TABLET (0.5 MG TOTAL) BY MOUTH AT BEDTIME AS NEEDED FOR ANXIETY. TRY NOT TO TAKE EVERY NIGHT. 11/06/19  Yes Hali Marry, MD  albuterol (VENTOLIN HFA) 108 (90 Base) MCG/ACT inhaler Inhale 2 puffs into the lungs every 6 (six) hours as needed for wheezing. 07/09/19   Samuel Bouche, NP  amoxicillin-clavulanate (AUGMENTIN) 875-125 MG tablet Take 1 tablet by mouth 2 (two) times daily. One po bid x 7 days 02/05/20   Noe Gens, PA-C  atorvastatin (LIPITOR) 40 MG tablet Take 1 tablet (40 mg  total) by mouth daily. 01/11/20   Hali Marry, MD  benzonatate (TESSALON) 100 MG capsule Take 1-2 capsules (100-200 mg total) by mouth every 8 (eight) hours. 02/05/20   Noe Gens, PA-C  candesartan-hydrochlorothiazide (ATACAND HCT) 16-12.5 MG tablet Take 1 tablet by mouth daily. 12/04/19   Hali Marry, MD  estradiol (ESTRACE) 0.1 MG/GM vaginal cream 1g vaginally nightly for two week then 3 times a week thereafter. 02/02/19   [provider]  fluconazole (DIFLUCAN) 100 MG tablet Take 1 tablet (100 mg total) by mouth daily. 12/10/19   Hali Marry, MD  fluticasone (FLONASE) 50 MCG/ACT nasal spray SPRAY 2 SPRAYS INTO EACH NOSTRIL EVERY DAY 02/21/18   Hali Marry, MD  fluvoxaMINE (LUVOX) 100 MG tablet TAKE 1 TABLET BY MOUTH EVERY DAY 11/06/19   Hali Marry, MD  ipratropium (ATROVENT) 0.06 % nasal spray Place 2 sprays into both nostrils 4 (four) times daily. 02/05/20   Noe Gens, PA-C  traZODone (DESYREL) 50 MG tablet TAKE ONE-HALF (1/2) TO ONE TABLET DAILY 05/11/19   Hali Marry, MD    Family History Family History  Problem Relation Age of Onset  . Asthma Mother   . Breast cancer Maternal Aunt   . Heart disease Maternal Aunt     Social History Social History   Tobacco Use  . Smoking status: Never  Smoker  . Smokeless tobacco: Never Used  Substance Use Topics  . Alcohol use: No    Alcohol/week: 0.0 standard drinks  . Drug use: No     Allergies   Lisinopril, Aspirin, Losartan, and Rosuvastatin   Review of Systems Review of Systems  Constitutional: Negative for chills and fever.  HENT: Positive for congestion and sore throat. Negative for ear pain, trouble swallowing and voice change.   Respiratory: Positive for cough. Negative for shortness of breath.   Cardiovascular: Negative for chest pain and palpitations.  Gastrointestinal: Negative for abdominal pain, diarrhea, nausea and vomiting.  Musculoskeletal:  Negative for arthralgias, back pain and myalgias.  Skin: Negative for rash.  All other systems reviewed and are negative.    Physical Exam Triage Vital Signs ED Triage Vitals  Enc Vitals Group     BP 02/05/20 1547 138/86     Pulse Rate 02/05/20 1547 94     Resp 02/05/20 1547 16     Temp 02/05/20 1547 99 F (37.2 C)     Temp Source 02/05/20 1547 Oral     SpO2 02/05/20 1547 97 %     Weight --      Height --      Head Circumference --      Peak Flow --      Pain Score 02/05/20 1543 7     Pain Loc --      Pain Edu? --      Excl. in [place]? --    No data found.  Updated Vital Signs BP 138/86 (BP Location: Right Arm)   Pulse 94   Temp 99 F (37.2 C) (Oral)   Resp 16   SpO2 97%   Visual Acuity Right Eye Distance:   Left Eye Distance:   Bilateral Distance:    Right Eye Near:   Left Eye Near:    Bilateral Near:     Physical Exam Vitals and nursing note reviewed.  Constitutional:      General: She is not in acute distress.    Appearance: She is well-developed. She is not ill-appearing, toxic-appearing or diaphoretic.  HENT:     Head: Normocephalic and atraumatic.     Right Ear: Tympanic membrane and ear canal normal.     Left Ear: Tympanic membrane and ear canal normal.     Nose: Nose normal.     Right Sinus: No maxillary sinus tenderness or frontal sinus tenderness.     Left Sinus: No maxillary sinus tenderness or frontal sinus tenderness.     Mouth/Throat:     Lips: Pink.     Mouth: Mucous membranes are moist.     Pharynx: Oropharynx is clear. Uvula midline.  Cardiovascular:     Rate and Rhythm: Normal rate and regular rhythm.  Pulmonary:     Effort: Pulmonary effort is normal. No respiratory distress.     Breath sounds: Normal breath sounds. No stridor. No wheezing, rhonchi or rales.  Musculoskeletal:        General: Normal range of motion.     Cervical back: Normal range of motion and neck supple.  Lymphadenopathy:     Cervical: No cervical adenopathy.    Skin:    General: Skin is warm and dry.  Neurological:     Mental Status: She is alert and oriented to person, place, and time.  Psychiatric:        Behavior: Behavior normal.      UC Treatments / Results  Labs (all labs  ordered are listed, but only abnormal results are displayed) Labs Reviewed - No data to display  EKG   Radiology No results found.  Procedures Procedures (including critical care time)  Medications Ordered in UC Medications - No data to display  Initial Impression / Assessment and Plan / UC Course  I have reviewed the triage vital signs and the nursing notes.  Pertinent labs & imaging results that were available during my care of the patient were reviewed by me and considered in my medical decision making (see chart for details).     Due to duration of symptoms, will start on antibiotics Encouraged f/u with PCP next week if not improving  Final Clinical Impressions(s) / UC Diagnoses   Final diagnoses:  Upper respiratory tract infection, unspecified type     Discharge Instructions      You may take 500mg  acetaminophen every 4-6 hours or in combination with ibuprofen 400-600mg  every 6-8 hours as needed for pain, inflammation, and fever.  Be sure to well hydrated with clear liquids and get at least 8 hours of sleep at night, preferably more while sick.   Please take antibiotics as prescribed and be sure to complete entire course even if you start to feel better to ensure infection does not come back.   Please follow up with family medicine in 1 week if needed.     ED Prescriptions    Medication Sig Dispense Auth. Provider   amoxicillin-clavulanate (AUGMENTIN) 875-125 MG tablet Take 1 tablet by mouth 2 (two) times daily. One po bid x 7 days 14 tablet Libbie Bartley O, PA-C   benzonatate (TESSALON) 100 MG capsule Take 1-2 capsules (100-200 mg total) by mouth every 8 (eight) hours. 21 capsule Gerarda Fraction, Shoua Ressler O, PA-C   ipratropium (ATROVENT) 0.06  % nasal spray Place 2 sprays into both nostrils 4 (four) times daily. 15 mL Noe Gens, PA-C     PDMP not reviewed this encounter.   Noe Gens, [place] 02/05/20 1752

## 2020-02-05 NOTE — Telephone Encounter (Signed)
Spoke with patient on phone, advised no openings today and to be seen at urgent care. Patient agreeable with plan

## 2020-02-05 NOTE — Discharge Instructions (Signed)
°  You may take 500mg  acetaminophen every 4-6 hours or in combination with ibuprofen 400-600mg  every 6-8 hours as needed for pain, inflammation, and fever.  Be sure to well hydrated with clear liquids and get at least 8 hours of sleep at night, preferably more while sick.   Please take antibiotics as prescribed and be sure to complete entire course even if you start to feel better to ensure infection does not come back.   Please follow up with family medicine in 1 week if needed.

## 2020-03-05 ENCOUNTER — Other Ambulatory Visit: Payer: Self-pay | Admitting: Family Medicine

## 2020-04-30 ENCOUNTER — Other Ambulatory Visit: Payer: Self-pay | Admitting: Family Medicine

## 2020-05-05 ENCOUNTER — Other Ambulatory Visit: Payer: Self-pay | Admitting: Family Medicine

## 2020-05-11 ENCOUNTER — Other Ambulatory Visit: Payer: Self-pay | Admitting: Family Medicine

## 2020-05-12 NOTE — Telephone Encounter (Signed)
Last written 05/02/20 #30 no refills I can not sign denial

## 2020-06-01 ENCOUNTER — Other Ambulatory Visit: Payer: Self-pay

## 2020-06-01 ENCOUNTER — Ambulatory Visit: Payer: BC Managed Care – PPO | Admitting: Family Medicine

## 2020-06-01 ENCOUNTER — Ambulatory Visit (INDEPENDENT_AMBULATORY_CARE_PROVIDER_SITE_OTHER): Payer: BC Managed Care – PPO

## 2020-06-01 ENCOUNTER — Encounter: Payer: Self-pay | Admitting: Family Medicine

## 2020-06-01 VITALS — BP 116/78 | HR 84 | Ht 61.0 in | Wt 152.0 lb

## 2020-06-01 DIAGNOSIS — Z0000 Encounter for general adult medical examination without abnormal findings: Secondary | ICD-10-CM | POA: Diagnosis not present

## 2020-06-01 DIAGNOSIS — Z1231 Encounter for screening mammogram for malignant neoplasm of breast: Secondary | ICD-10-CM | POA: Diagnosis not present

## 2020-06-01 DIAGNOSIS — N952 Postmenopausal atrophic vaginitis: Secondary | ICD-10-CM

## 2020-06-01 DIAGNOSIS — R062 Wheezing: Secondary | ICD-10-CM | POA: Diagnosis not present

## 2020-06-01 DIAGNOSIS — I10 Essential (primary) hypertension: Secondary | ICD-10-CM | POA: Diagnosis not present

## 2020-06-01 DIAGNOSIS — R7301 Impaired fasting glucose: Secondary | ICD-10-CM

## 2020-06-01 DIAGNOSIS — F341 Dysthymic disorder: Secondary | ICD-10-CM

## 2020-06-01 DIAGNOSIS — Z1211 Encounter for screening for malignant neoplasm of colon: Secondary | ICD-10-CM

## 2020-06-01 DIAGNOSIS — I700 Atherosclerosis of aorta: Secondary | ICD-10-CM

## 2020-06-01 MED ORDER — FLUVOXAMINE MALEATE 100 MG PO TABS: 100.0000 mg | ORAL_TABLET | Freq: Every day | ORAL | 1 refills | Status: DC

## 2020-06-01 MED ORDER — ESTRADIOL 0.1 MG/GM VA CREA: 1.0000 g | TOPICAL_CREAM | VAGINAL | 11 refills | Status: DC

## 2020-06-01 MED ORDER — ALBUTEROL SULFATE HFA 108 (90 BASE) MCG/ACT IN AERS: 2.0000 | INHALATION_SPRAY | Freq: Four times a day (QID) | RESPIRATORY_TRACT | 2 refills | Status: DC | PRN

## 2020-06-01 MED ORDER — CANDESARTAN CILEXETIL-HCTZ 16-12.5 MG PO TABS: 1.0000 | ORAL_TABLET | Freq: Every day | ORAL | 1 refills | Status: DC

## 2020-06-01 MED ORDER — FLUTICASONE PROPIONATE 50 MCG/ACT NA SUSP: NASAL | 3 refills | Status: DC

## 2020-06-01 NOTE — Progress Notes (Signed)
Subjective:     Tina Meyer is a 60 y.o. female and is here for a comprehensive physical exam. The patient reports no problems.  She is doing well overall.  Work has been very busy.  She is doing okay overall.  Social History   Socioeconomic History  . Marital status: Widowed    Spouse name: Not on file  . Number of children: 0  . Years of education: Not on file  . Highest education level: Not on file  Occupational History  . Occupation: dental office    Comment: [employer]  Tobacco Use  . Smoking status: Never Smoker  . Smokeless tobacco: Never Used  Substance and Sexual Activity  . Alcohol use: No    Alcohol/week: 0.0 standard drinks  . Drug use: No  . Sexual activity: Not Currently  Other Topics Concern  . Not on file  Social History Narrative   Had dogs.  Occ exercise.  1 caffeine drink per days.     Social Determinants of Health   Financial Resource Strain: Not on file  Food Insecurity: Not on file  Transportation Needs: Not on file  Physical Activity: Not on file  Stress: Not on file  Social Connections: Not on file  Intimate Partner Violence: Not on file   Health Maintenance  Topic Date Due  . COLONOSCOPY (Pts 45-82yrs Insurance coverage will need to be confirmed)  Never done  . MAMMOGRAM  02/16/2019  . COVID-19 Vaccine (2 - Moderna 3-dose series) 12/27/2019  . TETANUS/TDAP  10/11/2022  . INFLUENZA VACCINE  Completed  . Hepatitis C Screening  Completed  . HIV Screening  Completed    The following portions of the patient's history were reviewed and updated as appropriate: allergies, current medications, past family history, past medical history, past social history, past surgical history and problem list.  Review of Systems A comprehensive review of systems was negative.   Objective:    BP 116/78   Pulse 84   Ht 5\' 1"  (1.549 m)   Wt 152 lb (68.9 kg)   SpO2 97%   BMI 28.72 kg/m  General appearance: alert, cooperative  and appears stated age Head: Normocephalic, without obvious abnormality, atraumatic Eyes: conj clear, EOMI, PEERLA Ears: normal TM's and external ear canals both ears Nose: Nares normal. Septum midline. Mucosa normal. No drainage or sinus tenderness. Throat: lips, mucosa, and tongue normal; teeth and gums normal Neck: no adenopathy, no carotid bruit, no JVD, supple, symmetrical, trachea midline and thyroid not enlarged, symmetric, no tenderness/mass/nodules Back: symmetric, no curvature. ROM normal. No CVA tenderness. Lungs: clear to auscultation bilaterally Breasts: normal appearance, no masses or tenderness Heart: regular rate and rhythm and systolic 3/6 murmur Abdomen: soft, non-tender; bowel sounds normal; no masses,  no organomegaly Extremities: extremities normal, atraumatic, no cyanosis or edema Pulses: 2+ and symmetric Skin: Skin color, texture, turgor normal. No rashes or lesions Lymph nodes: Cervical, supraclavicular, and axillary nodes normal. Neurologic: Alert and oriented X 3, normal strength and tone. Normal symmetric reflexes. Normal coordination and gait    Assessment:    Healthy female exam.      Plan:     See After Visit Summary for Counseling Recommendations   Keep up a regular exercise program and make sure you are eating a healthy diet Try to eat 4 servings of dairy a day, or if you are lactose intolerant take a calcium with vitamin D daily.  Your vaccines are up to date.  Discussed need  for colon cancer screening.  She did agree to do Cologuard. She did request medication refills today.  HYPERTENSION, BENIGN Pressure looks great today.  Due for labs.  Refill sent to pharmacy.  IFG (impaired fasting glucose) Plan to recheck A1c.  ANXIETY DEPRESSION Refills sent to pharmacy stable on current regimen.  Follow-up in 6 months.

## 2020-06-01 NOTE — Assessment & Plan Note (Signed)
Pressure looks great today.  Due for labs.  Refill sent to pharmacy.

## 2020-06-01 NOTE — Patient Instructions (Signed)
Health Maintenance, Female Adopting a healthy lifestyle and getting preventive care are important in promoting health and wellness. Ask your health care provider about:  The right schedule for you to have regular tests and exams.  Things you can do on your own to prevent diseases and keep yourself healthy. What should I know about diet, weight, and exercise? Eat a healthy diet  Eat a diet that includes plenty of vegetables, fruits, low-fat dairy products, and lean protein.  Do not eat a lot of foods that are high in solid fats, added sugars, or sodium.   Maintain a healthy weight Body mass index (BMI) is used to identify weight problems. It estimates body fat based on height and weight. Your health care provider can help determine your BMI and help you achieve or maintain a healthy weight. Get regular exercise Get regular exercise. This is one of the most important things you can do for your health. Most adults should:  Exercise for at least 150 minutes each week. The exercise should increase your heart rate and make you sweat (moderate-intensity exercise).  Do strengthening exercises at least twice a week. This is in addition to the moderate-intensity exercise.  Spend less time sitting. Even light physical activity can be beneficial. Watch cholesterol and blood lipids Have your blood tested for lipids and cholesterol at 60 years of age, then have this test every 5 years. Have your cholesterol levels checked more often if:  Your lipid or cholesterol levels are high.  You are older than 60 years of age.  You are at high risk for heart disease. What should I know about cancer screening? Depending on your health history and family history, you may need to have cancer screening at various ages. This may include screening for:  Breast cancer.  Cervical cancer.  Colorectal cancer.  Skin cancer.  Lung cancer. What should I know about heart disease, diabetes, and high blood  pressure? Blood pressure and heart disease  High blood pressure causes heart disease and increases the risk of stroke. This is more likely to develop in people who have high blood pressure readings, are of African descent, or are overweight.  Have your blood pressure checked: ? Every 3-5 years if you are 18-39 years of age. ? Every year if you are 40 years old or older. Diabetes Have regular diabetes screenings. This checks your fasting blood sugar level. Have the screening done:  Once every three years after age 40 if you are at a normal weight and have a low risk for diabetes.  More often and at a younger age if you are overweight or have a high risk for diabetes. What should I know about preventing infection? Hepatitis B If you have a higher risk for hepatitis B, you should be screened for this virus. Talk with your health care provider to find out if you are at risk for hepatitis B infection. Hepatitis C Testing is recommended for:  Everyone born from 1945 through 1965.  Anyone with known risk factors for hepatitis C. Sexually transmitted infections (STIs)  Get screened for STIs, including gonorrhea and chlamydia, if: ? You are sexually active and are younger than 60 years of age. ? You are older than 60 years of age and your health care provider tells you that you are at risk for this type of infection. ? Your sexual activity has changed since you were last screened, and you are at increased risk for chlamydia or gonorrhea. Ask your health care provider   if you are at risk.  Ask your health care provider about whether you are at high risk for HIV. Your health care provider may recommend a prescription medicine to help prevent HIV infection. If you choose to take medicine to prevent HIV, you should first get tested for HIV. You should then be tested every 3 months for as long as you are taking the medicine. Pregnancy  If you are about to stop having your period (premenopausal) and  you may become pregnant, seek counseling before you get pregnant.  Take 400 to 800 micrograms (mcg) of folic acid every day if you become pregnant.  Ask for birth control (contraception) if you want to prevent pregnancy. Osteoporosis and menopause Osteoporosis is a disease in which the bones lose minerals and strength with aging. This can result in bone fractures. If you are 65 years old or older, or if you are at risk for osteoporosis and fractures, ask your health care provider if you should:  Be screened for bone loss.  Take a calcium or vitamin D supplement to lower your risk of fractures.  Be given hormone replacement therapy (HRT) to treat symptoms of menopause. Follow these instructions at home: Lifestyle  Do not use any products that contain nicotine or tobacco, such as cigarettes, e-cigarettes, and chewing tobacco. If you need help quitting, ask your health care provider.  Do not use street drugs.  Do not share needles.  Ask your health care provider for help if you need support or information about quitting drugs. Alcohol use  Do not drink alcohol if: ? Your health care provider tells you not to drink. ? You are pregnant, may be pregnant, or are planning to become pregnant.  If you drink alcohol: ? Limit how much you use to 0-1 drink a day. ? Limit intake if you are breastfeeding.  Be aware of how much alcohol is in your drink. In the [place] one drink equals one 12 oz bottle of beer (355 mL), one 5 oz glass of wine (148 mL), or one 1 oz glass of hard liquor (44 mL). General instructions  Schedule regular health, dental, and eye exams.  Stay current with your vaccines.  Tell your health care provider if: ? You often feel depressed. ? You have ever been abused or do not feel safe at home. Summary  Adopting a healthy lifestyle and getting preventive care are important in promoting health and wellness.  Follow your health care provider's instructions about healthy  diet, exercising, and getting tested or screened for diseases.  Follow your health care provider's instructions on monitoring your cholesterol and blood pressure. This information is not intended to replace advice given to you by your health care provider. Make sure you discuss any questions you have with your health care provider. Document Revised: 03/26/2018 Document Reviewed: 03/26/2018 Elsevier Patient Education  2021 Elsevier Inc.  

## 2020-06-01 NOTE — Assessment & Plan Note (Signed)
Plan to recheck A1c.

## 2020-06-01 NOTE — Assessment & Plan Note (Signed)
Refills sent to pharmacy stable on current regimen.  Follow-up in 6 months.

## 2020-06-02 LAB — HEMOGLOBIN A1C
Hgb A1c MFr Bld: 5.7 % of total Hgb — ABNORMAL HIGH (ref <5.7)
Mean Plasma Glucose: 117 mg/dL
eAG (mmol/L): 6.5 mmol/L

## 2020-06-02 LAB — BASIC METABOLIC PANEL WITH GFR
BUN: 20 mg/dL (ref 7–25)
CO2: 30 mmol/L (ref 20–32)
Calcium: 9.7 mg/dL (ref 8.6–10.4)
Chloride: 103 mmol/L (ref 98–110)
Creat: 0.98 mg/dL (ref 0.50–1.05)
GFR, Est African American: 73 mL/min/{1.73_m2} (ref >=60)
GFR, Est Non African American: 63 mL/min/{1.73_m2} (ref >=60)
Glucose, Bld: 94 mg/dL (ref 65–139)
Potassium: 4 mmol/L (ref 3.5–5.3)
Sodium: 141 mmol/L (ref 135–146)

## 2020-07-20 ENCOUNTER — Telehealth: Payer: Self-pay | Admitting: Family Medicine

## 2020-07-20 NOTE — Telephone Encounter (Signed)
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 Billing form placed on paper and placed in provider box. AM (07/20/20) *

## 2020-07-22 NOTE — Telephone Encounter (Signed)
Pt aware of charge. She will come by to pay and pick this up on monday

## 2020-07-22 NOTE — Telephone Encounter (Signed)
Forms completed. Placed in tonya's box.

## 2020-08-03 ENCOUNTER — Other Ambulatory Visit: Payer: Self-pay | Admitting: Family Medicine

## 2020-08-04 ENCOUNTER — Encounter: Payer: Self-pay | Admitting: Emergency Medicine

## 2020-08-04 ENCOUNTER — Emergency Department
Admission: EM | Admit: 2020-08-04 | Discharge: 2020-08-04 | Disposition: A | Discharge status: Home / Self Care | Payer: BC Managed Care – PPO | Source: Home / Self Care

## 2020-08-04 DIAGNOSIS — J40 Bronchitis, not specified as acute or chronic: Secondary | ICD-10-CM

## 2020-08-04 DIAGNOSIS — J069 Acute upper respiratory infection, unspecified: Secondary | ICD-10-CM

## 2020-08-04 MED ORDER — PREDNISONE 10 MG (21) PO TBPK: ORAL_TABLET | Freq: Every day | ORAL | 0 refills | Status: AC

## 2020-08-04 MED ORDER — GUAIFENESIN-CODEINE 100-10 MG/5ML PO SYRP: 5.0000 mL | ORAL_SOLUTION | Freq: Three times a day (TID) | ORAL | 0 refills | Status: DC | PRN

## 2020-08-04 NOTE — ED Triage Notes (Signed)
Patient c/o fever, congestion, non-productive cough, body chills x 3 days.  Patient has taken Tylenol and Advil.  Patient is vaccinated.

## 2020-08-04 NOTE — Discharge Instructions (Addendum)
I have sent in a prednisone taper for you to take for 6 days. 6 tablets on day one, 5 tablets on day two, 4 tablets on day three, 3 tablets on day four, 2 tablets on day five, and 1 tablet on day six.  I have sent in cough syrup for you to take. This medication can make you sleepy. Do not drive while taking this medication.  Your COVID and Influenza tests are pending.  You should self quarantine until the test results are back.    Take Tylenol or ibuprofen as needed for fever or discomfort.  Rest and keep yourself hydrated.    Follow-up with your primary care provider if your symptoms are not improving.

## 2020-08-04 NOTE — ED Provider Notes (Signed)
[place]   [id number] 08/04/20 Arrival Time: 6283   CC: COVID symptoms  SUBJECTIVE: History from: patient.  Tina Meyer is a 60 y.o. female who presents with dry cough, headache, body aches, fever up to 102, chills x 3 days. Reports that her two foster children are sick as well. Denies sick exposure to COVID, flu or strep. Denies recent travel. Has negative history of Covid. Has completed Covid vaccines, no booster. Has completed flu vaccine. Has taken OTC cough and cold medications for this with little relief. Cough is aggravated with activity. Denies previous symptoms in the past. Denies fsinus pain, rhinorrhea, sore throat, nausea, changes in bowel or bladder habits.    ROS: As per HPI.  All other pertinent ROS negative.     Past Medical History:  Diagnosis Date  . Depression   . Hyperlipidemia   . Hypertension    Past Surgical History:  Procedure Laterality Date  . ABDOMINAL HYSTERECTOMY  2004   Allergies  Allergen Reactions  . Lisinopril Anaphylaxis    REACTION: anaphalactic  . Aspirin Nausea Only    REACTION: nausea  . Losartan Other (See Comments)    Hair Loss  . Rosuvastatin     REACTION: myalgias   No current facility-administered medications on file prior to encounter.   Current Outpatient Medications on File Prior to Encounter  Medication Sig Dispense Refill  . albuterol (VENTOLIN HFA) 108 (90 Base) MCG/ACT inhaler Inhale 2 puffs into the lungs every 6 (six) hours as needed for wheezing. 8 g 2  . ALPRAZolam (XANAX) 0.5 MG tablet TAKE 1 TABLET (0.5 MG TOTAL) BY MOUTH AT BEDTIME AS NEEDED FOR ANXIETY. TRY NOT TO TAKE EVERY NIGHT. 30 tablet 0  . atorvastatin (LIPITOR) 40 MG tablet Take 1 tablet (40 mg total) by mouth daily. 30 tablet 11  . candesartan-hydrochlorothiazide (ATACAND HCT) 16-12.5 MG tablet Take 1 tablet by mouth daily. 90 tablet 1  . estradiol (ESTRACE) 0.1 MG/GM vaginal cream Place 1 g vaginally 3 (three) times a week. 42.5 g 11   . fluticasone (FLONASE) 50 MCG/ACT nasal spray SPRAY 2 SPRAYS INTO EACH NOSTRIL EVERY DAY 16 g 3  . fluvoxaMINE (LUVOX) 100 MG tablet Take 1 tablet (100 mg total) by mouth daily. 90 tablet 1  . ipratropium (ATROVENT) 0.06 % nasal spray Place 2 sprays into both nostrils 4 (four) times daily. 15 mL 1  . traZODone (DESYREL) 50 MG tablet TAKE ONE-HALF (1/2) TO ONE TABLET DAILY 90 tablet 3   Social History   Socioeconomic History  . Marital status: Widowed    Spouse name: Not on file  . Number of children: 0  . Years of education: Not on file  . Highest education level: Not on file  Occupational History  . Occupation: dental office    Comment: [employer]  Tobacco Use  . Smoking status: Never Smoker  . Smokeless tobacco: Never Used  Substance and Sexual Activity  . Alcohol use: No    Alcohol/week: 0.0 standard drinks  . Drug use: No  . Sexual activity: Not Currently  Other Topics Concern  . Not on file  Social History Narrative   Had dogs.  Occ exercise.  1 caffeine drink per days.     Social Determinants of Health   Financial Resource Strain: Not on file  Food Insecurity: Not on file  Transportation Needs: Not on file  Physical Activity: Not on file  Stress: Not on file  Social Connections: Not on  file  Intimate Partner Violence: Not on file   Family History  Problem Relation Age of Onset  . Asthma Mother   . Breast cancer Maternal Aunt   . Heart disease Maternal Aunt     OBJECTIVE:  Vitals:   08/04/20 0834  BP: 134/77  Pulse: (!) 106  Temp: 99.7 F (37.6 C)  TempSrc: Oral  SpO2: 95%     General appearance: alert; appears fatigued, but nontoxic; speaking in full sentences and tolerating own secretions HEENT: NCAT; Ears: EACs clear, TMs pearly gray; Eyes: PERRL.  EOM grossly intact. Sinuses: nontender; Nose: nares patent with clear rhinorrhea, Throat: oropharynx erythematous, cobblestoning present, tonsils non erythematous or enlarged, uvula  midline  Neck: supple with LAD Lungs: unlabored respirations, symmetrical air entry; cough: moderate; no respiratory distress; wheezing to left lower lobe Heart: regular rate and rhythm.  Radial pulses 2+ symmetrical bilaterally Skin: warm and dry Psychological: alert and cooperative; normal mood and affect  LABS:  No results found for this or any previous visit (from the past 24 hour(s)).   ASSESSMENT & PLAN:  1. Viral URI with cough   2. Bronchitis     Meds ordered this encounter  Medications  . predniSONE (STERAPRED UNI-PAK 21 TAB) 10 MG (21) TBPK tablet    Sig: Take by mouth daily for 6 days. Take 6 tablets on day 1, 5 tablets on day 2, 4 tablets on day 3, 3 tablets on day 4, 2 tablets on day 5, 1 tablet on day 6    Dispense:  21 tablet    Refill:  0    Order Specific Question:   Supervising Provider    Answer:   Chase Picket [id number]  . guaiFENesin-codeine (ROBITUSSIN AC) 100-10 MG/5ML syrup    Sig: Take 5 mLs by mouth 3 (three) times daily as needed for cough.    Dispense:  120 mL    Refill:  0    Order Specific Question:   Supervising Provider    Answer:   Chase Picket [id number]   Prescribed steroid taper Prescribed cheratussin cough syrup Sedation precautions given Continue supportive care at home COVID and flu testing ordered.  It will take between 2-3 days for test results. Someone will contact you regarding abnormal results. Work note provided Patient should remain in quarantine until they have received Covid results.  If negative you may resume normal activities (go back to work/school) while practicing hand hygiene, social distance, and mask wearing.  If positive, patient should remain in quarantine for at least 5 days from symptom onset AND greater than 72 hours after symptoms resolution (absence of fever without the use of fever-reducing medication and improvement in respiratory symptoms), whichever is longer Get plenty of rest and push fluids Use OTC  zyrtec for nasal congestion, runny nose, and/or sore throat Use OTC flonase for nasal congestion and runny nose Use medications daily for symptom relief Use OTC medications like ibuprofen or tylenol as needed fever or pain Call or go to the ED if you have any new or worsening symptoms such as fever, worsening cough, shortness of breath, chest tightness, chest pain, turning blue, changes in mental status.  Reviewed expectations re: course of current medical issues. Questions answered. Outlined signs and symptoms indicating need for more acute intervention. Patient verbalized understanding. After Visit Summary given.         Faustino Congress, NP 08/04/20 [phone]

## 2020-08-06 LAB — COVID-19, FLU A+B NAA
Influenza A, NAA: DETECTED — AB
Influenza B, NAA: NOT DETECTED
SARS-CoV-2, NAA: NOT DETECTED

## 2020-08-08 ENCOUNTER — Telehealth: Payer: Self-pay | Admitting: Family Medicine

## 2020-08-08 DIAGNOSIS — J101 Influenza due to other identified influenza virus with other respiratory manifestations: Secondary | ICD-10-CM

## 2020-08-08 MED ORDER — OSELTAMIVIR PHOSPHATE 75 MG PO CAPS: 75.0000 mg | ORAL_CAPSULE | Freq: Two times a day (BID) | ORAL | 0 refills | Status: DC

## 2020-08-08 NOTE — Telephone Encounter (Signed)
Flu A positive, sent in tamiflu BID x 5 days.

## 2020-11-08 ENCOUNTER — Other Ambulatory Visit: Payer: Self-pay | Admitting: Family Medicine

## 2020-11-30 ENCOUNTER — Other Ambulatory Visit: Payer: Self-pay

## 2020-11-30 ENCOUNTER — Ambulatory Visit: Payer: BC Managed Care – PPO | Admitting: Family Medicine

## 2020-11-30 ENCOUNTER — Encounter: Payer: Self-pay | Admitting: Family Medicine

## 2020-11-30 VITALS — BP 125/72 | HR 97 | Ht 61.0 in | Wt 149.0 lb

## 2020-11-30 DIAGNOSIS — R7301 Impaired fasting glucose: Secondary | ICD-10-CM

## 2020-11-30 DIAGNOSIS — N952 Postmenopausal atrophic vaginitis: Secondary | ICD-10-CM

## 2020-11-30 DIAGNOSIS — I10 Essential (primary) hypertension: Secondary | ICD-10-CM | POA: Diagnosis not present

## 2020-11-30 DIAGNOSIS — R062 Wheezing: Secondary | ICD-10-CM | POA: Diagnosis not present

## 2020-11-30 DIAGNOSIS — E7849 Other hyperlipidemia: Secondary | ICD-10-CM | POA: Diagnosis not present

## 2020-11-30 LAB — POCT GLYCOSYLATED HEMOGLOBIN (HGB A1C): Hemoglobin A1C: 5.4 % (ref 4.0–5.6)

## 2020-11-30 MED ORDER — FLUTICASONE PROPIONATE 50 MCG/ACT NA SUSP: NASAL | 3 refills | Status: AC

## 2020-11-30 MED ORDER — ATORVASTATIN CALCIUM 40 MG PO TABS: 40.0000 mg | ORAL_TABLET | Freq: Every day | ORAL | 11 refills | Status: DC

## 2020-11-30 MED ORDER — CANDESARTAN CILEXETIL-HCTZ 16-12.5 MG PO TABS: 1.0000 | ORAL_TABLET | Freq: Every day | ORAL | 1 refills | Status: DC

## 2020-11-30 MED ORDER — ESTRADIOL 0.1 MG/GM VA CREA: 1.0000 g | TOPICAL_CREAM | VAGINAL | 11 refills | Status: DC

## 2020-11-30 MED ORDER — ALBUTEROL SULFATE HFA 108 (90 BASE) MCG/ACT IN AERS: 2.0000 | INHALATION_SPRAY | Freq: Four times a day (QID) | RESPIRATORY_TRACT | 2 refills | Status: DC | PRN

## 2020-11-30 NOTE — Assessment & Plan Note (Addendum)
Pressure is little borderline today but she has been taking some Sudafed.  Due for updated lab work.

## 2020-11-30 NOTE — Assessment & Plan Note (Signed)
A1c looks phenomenal at 5.4.  Continue work on [employer] and regular exercise.

## 2020-11-30 NOTE — Assessment & Plan Note (Addendum)
Continue to monitor renal function every 6 to 12 months.

## 2020-11-30 NOTE — Assessment & Plan Note (Signed)
Due to recheck lipid panel.

## 2020-11-30 NOTE — Progress Notes (Signed)
Established Patient Office Visit  Subjective:  Patient ID: Tina Meyer, female    DOB: 07-07-1960  Age: 60 y.o. MRN: [id number]  CC:  Chief Complaint  Patient presents with   Hypertension    HPI ANARIE CAL presents for   Hypertension- Pt denies chest pain, SOB, dizziness, or heart palpitations.  Taking meds as directed w/o problems.  Denies medication side effects.    Impaired fasting glucose-no increased thirst or urination. No symptoms consistent with hypoglycemia.  She is in the middle of adopting a little girl called Linwood Dibbles who is 60 years old.  Has been a foster parent for years.she is so excited!   Past Medical History:  Diagnosis Date   Depression    Hyperlipidemia    Hypertension     Past Surgical History:  Procedure Laterality Date   ABDOMINAL HYSTERECTOMY  2004    Family History  Problem Relation Age of Onset   Asthma Mother    Breast cancer Maternal Aunt    Heart disease Maternal Aunt     Social History   Socioeconomic History   Marital status: Widowed    Spouse name: Not on file   Number of children: 0   Years of education: Not on file   Highest education level: Not on file  Occupational History   Occupation: dental office    Comment: [employer]  Tobacco Use   Smoking status: Never   Smokeless tobacco: Never  Substance and Sexual Activity   Alcohol use: No    Alcohol/week: 0.0 standard drinks   Drug use: No   Sexual activity: Not Currently  Other Topics Concern   Not on file  Social History Narrative   Had dogs.  Occ exercise.  1 caffeine drink per days.     Social Determinants of Health   Financial Resource Strain: Not on file  Food Insecurity: Not on file  Transportation Needs: Not on file  Physical Activity: Not on file  Stress: Not on file  Social Connections: Not on file  Intimate Partner Violence: Not on file    Outpatient Medications Prior to Visit  Medication Sig Dispense Refill    ALPRAZolam (XANAX) 0.5 MG tablet TAKE 1 TABLET (0.5 MG TOTAL) BY MOUTH AT BEDTIME AS NEEDED FOR ANXIETY. TRY NOT TO TAKE EVERY NIGHT. 30 tablet 0   fluvoxaMINE (LUVOX) 100 MG tablet Take 1 tablet (100 mg total) by mouth daily. 90 tablet 1   ipratropium (ATROVENT) 0.06 % nasal spray Place 2 sprays into both nostrils 4 (four) times daily. 15 mL 1   traZODone (DESYREL) 50 MG tablet TAKE ONE-HALF (1/2) TO ONE TABLET DAILY 90 tablet 3   albuterol (VENTOLIN HFA) 108 (90 Base) MCG/ACT inhaler Inhale 2 puffs into the lungs every 6 (six) hours as needed for wheezing. 8 g 2   atorvastatin (LIPITOR) 40 MG tablet Take 1 tablet (40 mg total) by mouth daily. 30 tablet 11   candesartan-hydrochlorothiazide (ATACAND HCT) 16-12.5 MG tablet Take 1 tablet by mouth daily. 90 tablet 1   estradiol (ESTRACE) 0.1 MG/GM vaginal cream Place 1 g vaginally 3 (three) times a week. 42.5 g 11   fluticasone (FLONASE) 50 MCG/ACT nasal spray SPRAY 2 SPRAYS INTO EACH NOSTRIL EVERY DAY 16 g 3   guaiFENesin-codeine (ROBITUSSIN AC) 100-10 MG/5ML syrup Take 5 mLs by mouth 3 (three) times daily as needed for cough. 120 mL 0   oseltamivir (TAMIFLU) 75 MG capsule Take 1 capsule (75 mg total) by  mouth every 12 (twelve) hours. 10 capsule 0   No facility-administered medications prior to visit.    Allergies  Allergen Reactions   Lisinopril Anaphylaxis    REACTION: anaphalactic   Aspirin Nausea Only    REACTION: nausea   Losartan Other (See Comments)    Hair Loss   Rosuvastatin     REACTION: myalgias    ROS Review of Systems    Objective:    Physical Exam Constitutional:      Appearance: Normal appearance. She is well-developed.  HENT:     Head: Normocephalic and atraumatic.  Cardiovascular:     Rate and Rhythm: Normal rate and regular rhythm.     Heart sounds: Normal heart sounds.  Pulmonary:     Effort: Pulmonary effort is normal.     Breath sounds: Normal breath sounds.  Skin:    General: Skin is warm and dry.   Neurological:     Mental Status: She is alert and oriented to person, place, and time.  Psychiatric:        Behavior: Behavior normal.    BP 125/72   Pulse 97   Ht '5\' 1"'$  (1.549 m)   Wt 149 lb (67.6 kg)   SpO2 93%   BMI 28.15 kg/m  Wt Readings from Last 3 Encounters:  11/30/20 149 lb (67.6 kg)  06/01/20 152 lb (68.9 kg)  12/10/19 146 lb (66.2 kg)     Health Maintenance Due  Topic Date Due   Zoster Vaccines- Shingrix (1 of 2) Never done   COVID-19 Vaccine (2 - Moderna series) 12/27/2019   INFLUENZA VACCINE  11/14/2020    There are no preventive care reminders to display for this patient.  Lab Results  Component Value Date   TSH 3.26 11/12/2018   Lab Results  Component Value Date   WBC 8.1 12/11/2019   HGB 12.5 12/11/2019   HCT 38.1 12/11/2019   MCV 82.1 12/11/2019   PLT 438 (H) 12/11/2019   Lab Results  Component Value Date   NA 141 06/01/2020   K 4.0 06/01/2020   CO2 30 06/01/2020   GLUCOSE 94 06/01/2020   BUN 20 06/01/2020   CREATININE 0.98 06/01/2020   BILITOT 0.3 12/11/2019   ALKPHOS 77 05/23/2015   AST 14 12/11/2019   ALT 10 12/11/2019   PROT 7.1 12/11/2019   ALBUMIN 4.1 05/23/2015   CALCIUM 9.7 06/01/2020   Lab Results  Component Value Date   CHOL 310 (H) 12/11/2019   Lab Results  Component Value Date   HDL 42 (L) 12/11/2019   Lab Results  Component Value Date   LDLCALC 218 (H) 12/11/2019   Lab Results  Component Value Date   TRIG 297 (H) 12/11/2019   Lab Results  Component Value Date   CHOLHDL 7.4 (H) 12/11/2019   Lab Results  Component Value Date   HGBA1C 5.4 11/30/2020      Assessment & Plan:   Problem List Items Addressed This Visit       Cardiovascular and Mediastinum   HYPERTENSION, BENIGN    Pressure is little borderline today but she has been taking some Sudafed.  Due for updated lab work.      Relevant Medications   atorvastatin (LIPITOR) 40 MG tablet   candesartan-hydrochlorothiazide (ATACAND HCT) 16-12.5  MG tablet     Endocrine   IFG (impaired fasting glucose) - Primary    A1c looks phenomenal at 5.4.  Continue work on [employer] and regular exercise.  Relevant Orders   CBC   COMPLETE METABOLIC PANEL WITH GFR   Lipid panel   POCT glycosylated hemoglobin (Hb A1C) (Completed)     Other   Hyperlipidemia    Due to recheck lipid panel.      Relevant Medications   atorvastatin (LIPITOR) 40 MG tablet   candesartan-hydrochlorothiazide (ATACAND HCT) 16-12.5 MG tablet   Other Visit Diagnoses     Wheezing       Relevant Medications   albuterol (VENTOLIN HFA) 108 (90 Base) MCG/ACT inhaler   Vaginal atrophy       Relevant Medications   estradiol (ESTRACE) 0.1 MG/GM vaginal cream       Declined shingles vaccine and colon cancer screening today but would like to get a flu vaccine.  Meds ordered this encounter  Medications   albuterol (VENTOLIN HFA) 108 (90 Base) MCG/ACT inhaler    Sig: Inhale 2 puffs into the lungs every 6 (six) hours as needed for wheezing.    Dispense:  8 g    Refill:  2    Please use generic pro-air   atorvastatin (LIPITOR) 40 MG tablet    Sig: Take 1 tablet (40 mg total) by mouth daily.    Dispense:  30 tablet    Refill:  11   candesartan-hydrochlorothiazide (ATACAND HCT) 16-12.5 MG tablet    Sig: Take 1 tablet by mouth daily.    Dispense:  90 tablet    Refill:  1   estradiol (ESTRACE) 0.1 MG/GM vaginal cream    Sig: Place 1 g vaginally 3 (three) times a week.    Dispense:  42.5 g    Refill:  11   fluticasone (FLONASE) 50 MCG/ACT nasal spray    Sig: SPRAY 2 SPRAYS INTO EACH NOSTRIL EVERY DAY    Dispense:  16 g    Refill:  3     Follow-up: Return in about 6 months (around 06/02/2021) for Hypertension.    Beatrice Lecher, MD

## 2020-12-12 ENCOUNTER — Other Ambulatory Visit: Payer: Self-pay | Admitting: Family Medicine

## 2020-12-14 ENCOUNTER — Encounter: Payer: Self-pay | Admitting: Family Medicine

## 2021-01-03 ENCOUNTER — Other Ambulatory Visit: Payer: Self-pay | Admitting: Family Medicine

## 2021-01-05 ENCOUNTER — Other Ambulatory Visit: Payer: Self-pay | Admitting: Family Medicine

## 2021-02-06 ENCOUNTER — Other Ambulatory Visit: Payer: Self-pay | Admitting: Family Medicine

## 2021-04-20 ENCOUNTER — Other Ambulatory Visit: Payer: Self-pay | Admitting: Family Medicine

## 2021-04-22 ENCOUNTER — Other Ambulatory Visit: Payer: Self-pay | Admitting: Family Medicine

## 2021-04-22 DIAGNOSIS — I10 Essential (primary) hypertension: Secondary | ICD-10-CM

## 2021-05-04 ENCOUNTER — Other Ambulatory Visit: Payer: Self-pay

## 2021-05-04 ENCOUNTER — Ambulatory Visit (INDEPENDENT_AMBULATORY_CARE_PROVIDER_SITE_OTHER): Payer: 59 | Admitting: Family Medicine

## 2021-05-04 ENCOUNTER — Encounter: Payer: Self-pay | Admitting: Family Medicine

## 2021-05-04 VITALS — BP 109/69 | HR 90 | Resp 16 | Ht 61.0 in | Wt 144.0 lb

## 2021-05-04 DIAGNOSIS — Z0000 Encounter for general adult medical examination without abnormal findings: Secondary | ICD-10-CM

## 2021-05-04 NOTE — Progress Notes (Addendum)
Subjective:     Tina Meyer is a 61 y.o. female and is here for a comprehensive physical exam. The patient reports no problems.  She did bring a form from the [place] division of social services for a medical evaluation.  She is planning on adopting.   Social History   Socioeconomic History   Marital status: Widowed    Spouse name: Not on file   Number of children: 0   Years of education: Not on file   Highest education level: Not on file  Occupational History   Occupation: dental office    Comment: [employer]  Tobacco Use   Smoking status: Never   Smokeless tobacco: Never  Substance and Sexual Activity   Alcohol use: No    Alcohol/week: 0.0 standard drinks   Drug use: No   Sexual activity: Not Currently  Other Topics Concern   Not on file  Social History Narrative   1 dog.  Walk daily.     Social Determinants of Health   Financial Resource Strain: Not on file  Food Insecurity: Not on file  Transportation Needs: Not on file  Physical Activity: Not on file  Stress: Not on file  Social Connections: Not on file  Intimate Partner Violence: Not on file   Health Maintenance  Topic Date Due   COVID-19 Vaccine (3 - Booster for Moderna series) 05/20/2021 (Originally 03/01/2020)   INFLUENZA VACCINE  07/14/2021 (Originally 11/14/2020)   Zoster Vaccines- Shingrix (1 of 2) 08/02/2021 (Originally 07/30/2010)   COLONOSCOPY (Pts 45-26yrs Insurance coverage will need to be confirmed)  11/30/2021 (Originally 07/29/2005)   MAMMOGRAM  06/01/2022   TETANUS/TDAP  10/11/2022   Hepatitis C Screening  Completed   HIV Screening  Completed   Pneumococcal Vaccine 53-67 Years old  Aged Out   HPV VACCINES  Aged Out    The following portions of the patient's history were reviewed and updated as appropriate: allergies, current medications, past family history, past medical history, past social history, past surgical history, and problem list.  Review of  Systems Pertinent items are noted in HPI.   Objective:    BP 109/69    Pulse 90    Resp 16    Ht 5\' 1"  (1.549 m)    Wt 144 lb (65.3 kg)    SpO2 96%    BMI 27.21 kg/m  General appearance: alert, cooperative, and appears stated age Head: Normocephalic, without obvious abnormality, atraumatic Eyes:  conj clear, EOMi, PEERLA Ears: normal TM's and external ear canals both ears Nose: Nares normal. Septum midline. Mucosa normal. No drainage or sinus tenderness. Throat: lips, mucosa, and tongue normal; teeth and gums normal Neck: no adenopathy, no carotid bruit, no JVD, supple, symmetrical, trachea midline, and thyroid not enlarged, symmetric, no tenderness/mass/nodules Back: symmetric, no curvature. ROM normal. No CVA tenderness. Lungs: clear to auscultation bilaterally Breasts: normal appearance, no masses or tenderness Heart: regular rate and rhythm and 2/6 SEM Abdomen: soft, non-tender; bowel sounds normal; no masses,  no organomegaly Extremities: extremities normal, atraumatic, no cyanosis or edema Pulses: 2+ and symmetric Skin: Skin color, texture, turgor normal. No rashes or lesions Lymph nodes: Cervical, supraclavicular, and axillary nodes normal. Neurologic: Grossly normal    Assessment:    Healthy female exam.      Plan:     See After Visit Summary for Counseling Recommendations  Keep up a regular exercise program and make sure you are eating a healthy diet Try to eat 4 servings  of dairy a day, or if you are lactose intolerant take a calcium with vitamin D daily.  Your vaccines are up to date.  Please schedule mammogram for next month. Plans on getting eye exam done this year. She will check the day on her Cologuard box and if expired she will give [place] a call back and let us know. Due for labs today.  Form completed for adoption.

## 2021-05-05 LAB — COMPLETE METABOLIC PANEL WITH GFR
AG Ratio: 1.8 (calc) (ref 1.0–2.5)
ALT: 17 U/L (ref 6–29)
AST: 22 U/L (ref 10–35)
Albumin: 4.4 g/dL (ref 3.6–5.1)
Alkaline phosphatase (APISO): 84 U/L (ref 37–153)
BUN: 15 mg/dL (ref 7–25)
CO2: 34 mmol/L — ABNORMAL HIGH (ref 20–32)
Calcium: 9.5 mg/dL (ref 8.6–10.4)
Chloride: 103 mmol/L (ref 98–110)
Creat: 1.04 mg/dL (ref 0.50–1.05)
Globulin: 2.5 g/dL (calc) (ref 1.9–3.7)
Glucose, Bld: 95 mg/dL (ref 65–99)
Potassium: 4.1 mmol/L (ref 3.5–5.3)
Sodium: 142 mmol/L (ref 135–146)
Total Bilirubin: 0.6 mg/dL (ref 0.2–1.2)
Total Protein: 6.9 g/dL (ref 6.1–8.1)
eGFR: 62 mL/min/{1.73_m2} (ref >=60)

## 2021-05-05 LAB — CBC
HCT: 37.4 % (ref 35.0–45.0)
Hemoglobin: 12.3 g/dL (ref 11.7–15.5)
MCH: 26.8 pg — ABNORMAL LOW (ref 27.0–33.0)
MCHC: 32.9 g/dL (ref 32.0–36.0)
MCV: 81.5 fL (ref 80.0–100.0)
MPV: 10 fL (ref 7.5–12.5)
Platelets: 275 10*3/uL (ref 140–400)
RBC: 4.59 10*6/uL (ref 3.80–5.10)
RDW: 13.3 % (ref 11.0–15.0)
WBC: 6.3 10*3/uL (ref 3.8–10.8)

## 2021-05-05 LAB — LIPID PANEL W/REFLEX DIRECT LDL
Cholesterol: 190 mg/dL (ref <200)
HDL: 52 mg/dL (ref >=50)
LDL Cholesterol (Calc): 113 mg/dL (calc) — ABNORMAL HIGH
Non-HDL Cholesterol (Calc): 138 mg/dL (calc) — ABNORMAL HIGH (ref <130)
Total CHOL/HDL Ratio: 3.7 (calc) (ref <5.0)
Triglycerides: 141 mg/dL (ref <150)

## 2021-05-05 LAB — HEMOGLOBIN A1C
Hgb A1c MFr Bld: 5.6 % of total Hgb (ref <5.7)
Mean Plasma Glucose: 114 mg/dL
eAG (mmol/L): 6.3 mmol/L

## 2021-05-05 NOTE — Progress Notes (Signed)
Hi Kim, LDL looks so much better this time.  It was 218 it is now down to 113.  That is actually most a 50% reduction which is pretty impressive!  No statins will only reduce your cholesterol by about 40% so I really am impressed with the changes that you have made and with your response to the medication.  Still really left to try to get your LDL down to less than 100.  If you be open to increasing the medication then please let me know.  We could always give it a try and see how you do and then recheck those lipids again in a few months.  Blood count metabolic panel are okay.  A1c is at 5.6 still in the normal range which is great.  Great job and trying to keep that under 5.7.

## 2021-05-23 ENCOUNTER — Other Ambulatory Visit: Payer: Self-pay | Admitting: Family Medicine

## 2021-05-30 ENCOUNTER — Other Ambulatory Visit: Payer: Self-pay | Admitting: Family Medicine

## 2021-05-30 ENCOUNTER — Encounter: Payer: Self-pay | Admitting: Family Medicine

## 2021-05-31 NOTE — Telephone Encounter (Signed)
[facility] printed xanax prescription by mistake. I shredded the prescription. Patient still needs a refill.

## 2021-06-07 ENCOUNTER — Encounter: Payer: BC Managed Care – PPO | Admitting: Family Medicine

## 2021-06-09 ENCOUNTER — Telehealth: Payer: Self-pay | Admitting: Neurology

## 2021-06-09 DIAGNOSIS — Z1211 Encounter for screening for malignant neoplasm of colon: Secondary | ICD-10-CM

## 2021-06-09 NOTE — Telephone Encounter (Signed)
Cologuard order expired, unable to reach patient to see if she wants [place] to order again. Will place order and they can contact patient to discuss.

## 2021-06-22 DIAGNOSIS — Z1211 Encounter for screening for malignant neoplasm of colon: Secondary | ICD-10-CM

## 2021-07-07 ENCOUNTER — Other Ambulatory Visit: Payer: Self-pay | Admitting: Family Medicine

## 2021-07-07 DIAGNOSIS — F341 Dysthymic disorder: Secondary | ICD-10-CM

## 2021-07-07 MED ORDER — TRAZODONE HCL 50 MG PO TABS: 25.0000 mg | ORAL_TABLET | Freq: Every day | ORAL | 3 refills | Status: DC

## 2021-07-11 ENCOUNTER — Other Ambulatory Visit: Payer: Self-pay

## 2021-07-11 DIAGNOSIS — J0100 Acute maxillary sinusitis, unspecified: Secondary | ICD-10-CM

## 2021-07-11 MED ORDER — IPRATROPIUM BROMIDE 0.06 % NA SOLN: 2.0000 | Freq: Four times a day (QID) | NASAL | 2 refills | Status: DC

## 2021-07-19 IMAGING — MG MM DIGITAL SCREENING BILAT W/ TOMO AND CAD
8 series · 8 of 24 positions shown · non-contrast
Comparison: Previous exam(s).

CLINICAL DATA: Screening.

EXAM:
DIGITAL SCREENING BILATERAL MAMMOGRAM WITH TOMOSYNTHESIS AND CAD
TECHNIQUE: Bilateral screening digital craniocaudal and mediolateral oblique
mammograms were obtained. Bilateral screening digital breast
tomosynthesis was performed. The images were evaluated with
computer-aided detection.

[R CC synth-2D]
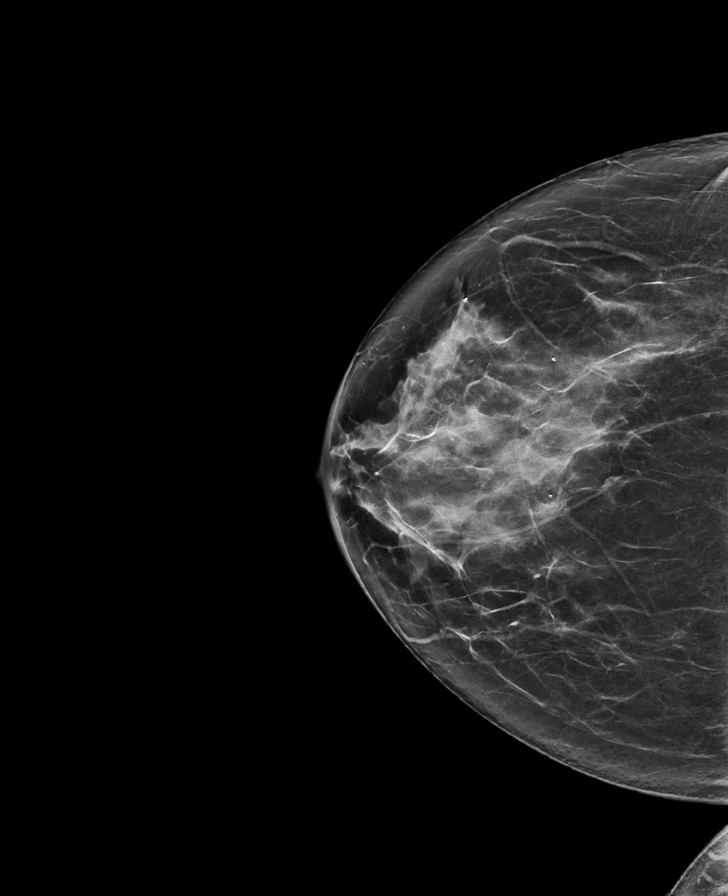

[L MLO synth-2D]
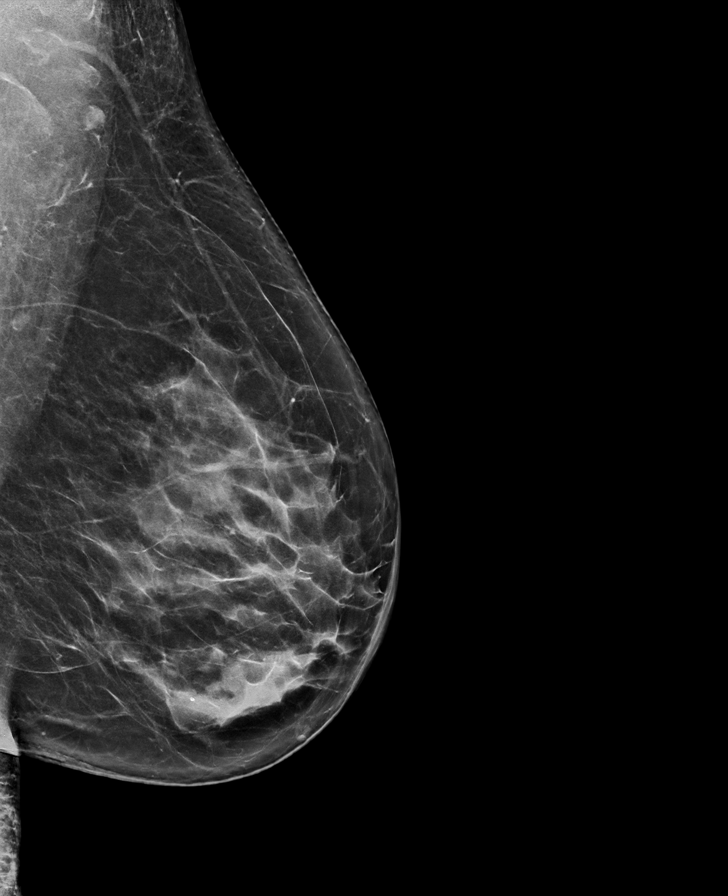

[R MLO synth-2D]
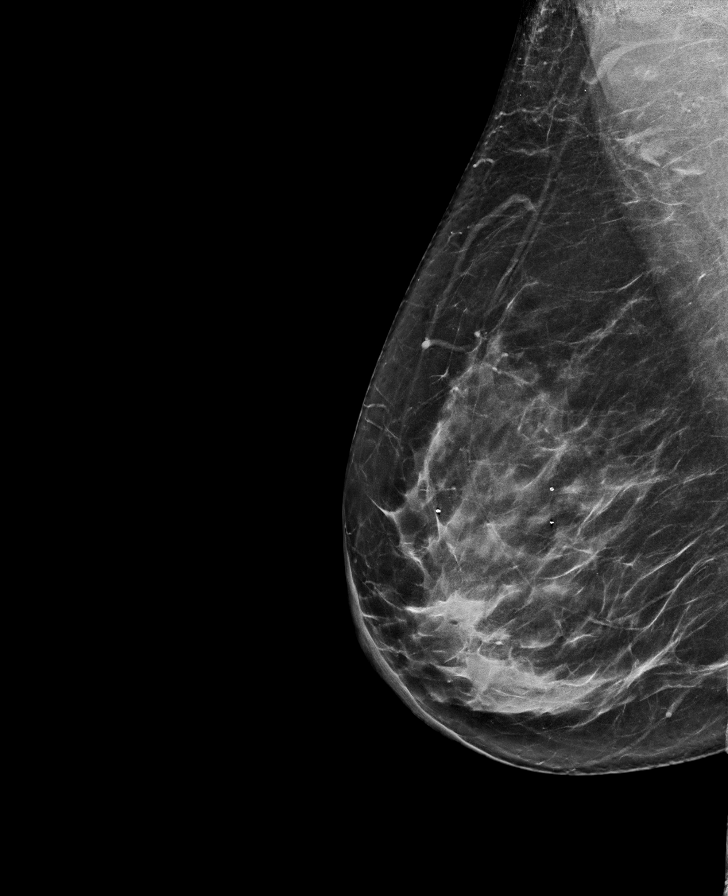

[L CC synth-2D]
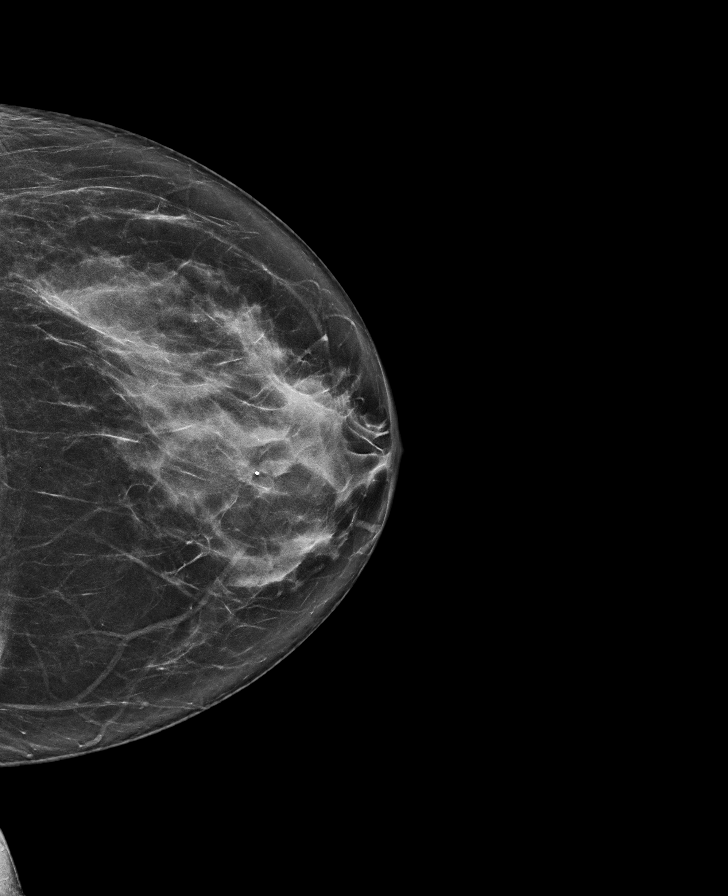

[L MLO tomo · tomo slice 39/76.0]
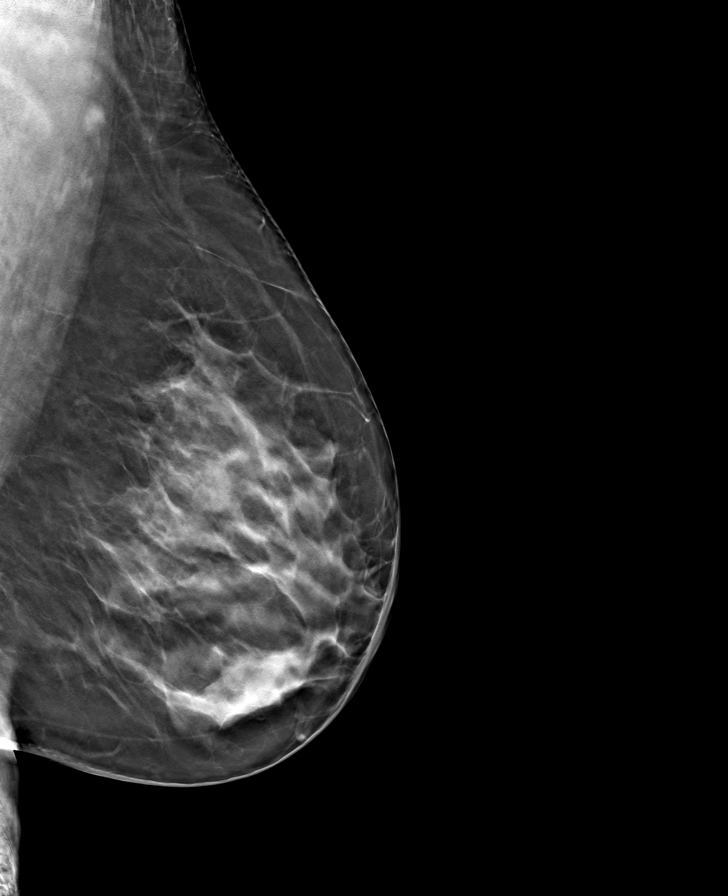

[R CC tomo · tomo slice 37/74.0]
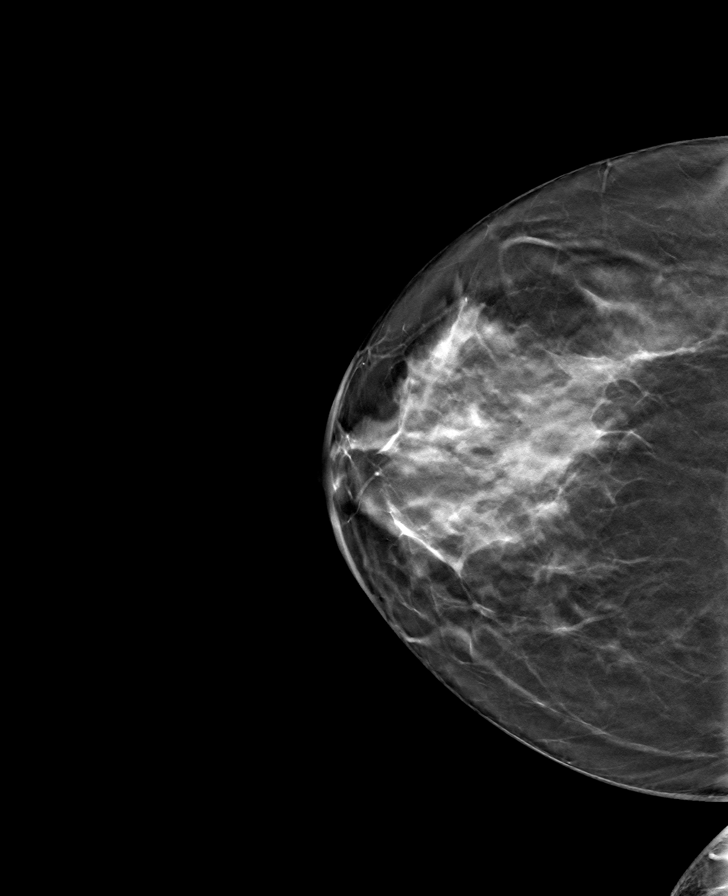

[L CC tomo · tomo slice 37/73.0]
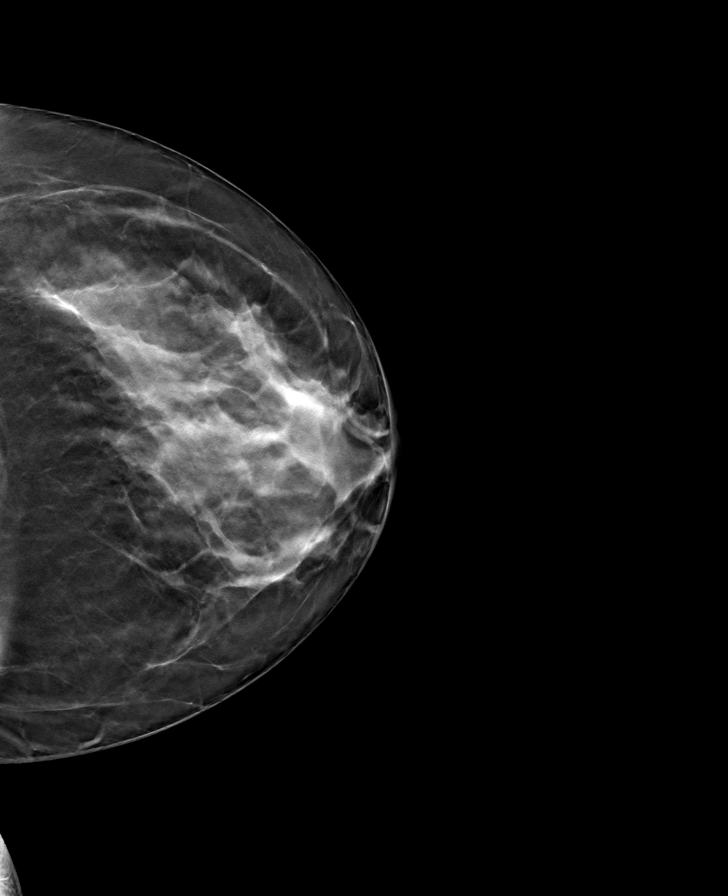

[R MLO tomo · tomo slice 40/79.0]
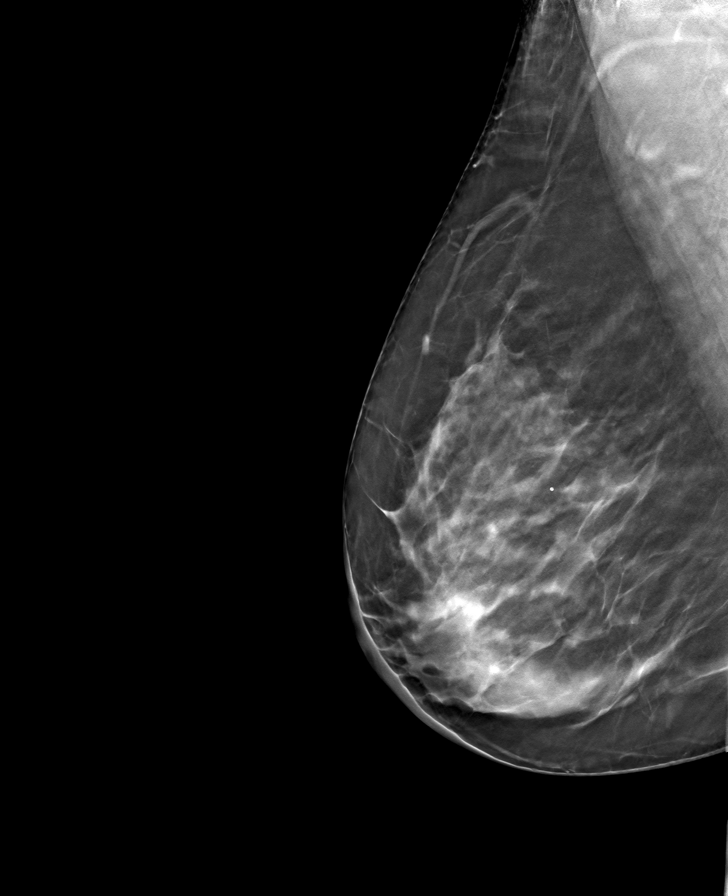

[8 of 24 positions shown; findings below may reference images not displayed]

ACR Breast Density Category c: The breast tissue is heterogeneously
dense, which may obscure small masses.
FINDINGS: There are no findings suspicious for malignancy.
IMPRESSION: No mammographic evidence of malignancy. A result letter of this
screening mammogram will be mailed directly to the patient.

RECOMMENDATION:
Screening mammogram in one year. (Code:Q3-[id number])

BI-RADS CATEGORY  1: Negative.

## 2021-08-01 ENCOUNTER — Encounter: Payer: Self-pay | Admitting: Emergency Medicine

## 2021-08-01 ENCOUNTER — Other Ambulatory Visit: Payer: Self-pay

## 2021-08-01 ENCOUNTER — Emergency Department (INDEPENDENT_AMBULATORY_CARE_PROVIDER_SITE_OTHER)
Admission: EM | Admit: 2021-08-01 | Discharge: 2021-08-01 | Disposition: A | Discharge status: Home / Self Care | Payer: 59 | Source: Home / Self Care

## 2021-08-01 DIAGNOSIS — H109 Unspecified conjunctivitis: Secondary | ICD-10-CM | POA: Diagnosis not present

## 2021-08-01 MED ORDER — MOXIFLOXACIN HCL 0.5 % OP SOLN: 1.0000 [drp] | Freq: Three times a day (TID) | OPHTHALMIC | 0 refills | Status: AC

## 2021-08-01 NOTE — ED Triage Notes (Addendum)
Rt eye hurts, red, itches, burns was crusty this morning, Lt was crusty too. ?Sore throat, headache, upset stomach x 1 week. ?Vaccinated ?

## 2021-08-01 NOTE — ED Provider Notes (Addendum)
?[facility] ? ? ? ?CSN: [id number] ?Arrival date & time: 08/01/21  3846 ? ? ?  ? ?History   ?Chief Complaint ?Chief Complaint  ?Patient presents with  ? Eye Problem  ? ? ?HPI ?Tina Meyer is a 61 y.o. female.  ? ?HPI 61 year old female presents with right eye redness, itchiness and crusting this morning.  Patient reports similar symptoms in left eye as well.  Additionally, patient reports sore throat, headache, upset stomach for 1 week.  PMH significant for HTN and CKD stage II. ? ?Past Medical History:  ?Diagnosis Date  ? Depression   ? Hyperlipidemia   ? Hypertension   ? ? ?Patient Active Problem List  ? Diagnosis Date Noted  ? IFG (impaired fasting glucose) 12/14/2019  ? CKD (chronic kidney disease) stage 2, GFR 60-89 ml/min 12/14/2019  ? Mild aortic sclerosis 01/30/2016  ? Heart murmur 08/07/2011  ? DERMATITIS, ATOPIC 12/19/2010  ? Hyperlipidemia 05/27/2008  ? ANXIETY DEPRESSION 05/27/2008  ? HYPERTENSION, BENIGN 05/27/2008  ? INSOMNIA 05/27/2008  ? ? ?Past Surgical History:  ?Procedure Laterality Date  ? ABDOMINAL HYSTERECTOMY  2004  ? ? ?OB History   ?No obstetric history on file. ?  ? ? ? ?Home Medications   ? ?Prior to Admission medications   ?Medication Sig Start Date End Date Taking? Authorizing Provider  ?moxifloxacin (VIGAMOX) 0.5 % ophthalmic solution Place 1 drop into the right eye 3 (three) times daily for 7 days. 08/01/21 08/08/21 Yes Eliezer Lofts, FNP  ?albuterol (VENTOLIN HFA) 108 (90 Base) MCG/ACT inhaler Inhale 2 puffs into the lungs every 6 (six) hours as needed for wheezing. 11/30/20   Hali Marry, MD  ?ALPRAZolam Duanne Moron) 0.5 MG tablet TAKE 1 TABLET (0.5 MG TOTAL) BY MOUTH AT BEDTIME AS NEEDED FOR ANXIETY. TRY NOT TO TAKE EVERY NIGHT. 07/07/21   Hali Marry, MD  ?atorvastatin (LIPITOR) 40 MG tablet Take 1 tablet (40 mg total) by mouth daily. 11/30/20   Hali Marry, MD  ?candesartan-hydrochlorothiazide (ATACAND HCT) 16-12.5 MG tablet TAKE 1 TABLET BY  MOUTH EVERY DAY 04/24/21   Hali Marry, MD  ?estradiol (ESTRACE) 0.1 MG/GM vaginal cream Place 1 g vaginally 3 (three) times a week. 11/30/20   Hali Marry, MD  ?fluticasone Asencion Islam) 50 MCG/ACT nasal spray SPRAY 2 SPRAYS INTO EACH NOSTRIL EVERY DAY 11/30/20   Hali Marry, MD  ?fluvoxaMINE (LUVOX) 100 MG tablet Take 1 tablet (100 mg total) by mouth daily. 02/06/21   Hali Marry, MD  ?ipratropium (ATROVENT) 0.06 % nasal spray Place 2 sprays into both nostrils 4 (four) times daily. 07/11/21   Hali Marry, MD  ?traZODone (DESYREL) 50 MG tablet Take 0.5-1 tablets (25-50 mg total) by mouth daily. 07/07/21   Hali Marry, MD  ? ? ?Family History ?Family History  ?Problem Relation Age of Onset  ? Asthma Mother   ? Breast cancer Maternal Aunt   ? Heart disease Maternal Aunt   ? ? ?Social History ?Social History  ? ?Tobacco Use  ? Smoking status: Never  ? Smokeless tobacco: Never  ?Vaping Use  ? Vaping Use: Never used  ?Substance Use Topics  ? Alcohol use: No  ?  Alcohol/week: 0.0 standard drinks  ? Drug use: No  ? ? ? ?Allergies   ?Lisinopril, Aspirin, Losartan, and Rosuvastatin ? ? ?Review of Systems ?Review of Systems  ?HENT:  Positive for sore throat.   ?Eyes:  Positive for pain and redness.  ?All other systems  reviewed and are negative. ? ? ?Physical Exam ?Triage Vital Signs ?ED Triage Vitals  ?Enc Vitals Group  ?   BP 08/01/21 0833 116/82  ?   Pulse Rate 08/01/21 0833 72  ?   Resp 08/01/21 0833 16  ?   Temp 08/01/21 0833 99 ?F (37.2 ?C)  ?   Temp Source 08/01/21 0833 Oral  ?   SpO2 08/01/21 0833 97 %  ?   Weight 08/01/21 0834 144 lb (65.3 kg)  ?   Height 08/01/21 0834 '5\' 2"'$  (1.575 m)  ?   Head Circumference --   ?   Peak Flow --   ?   Pain Score 08/01/21 0833 7  ?   Pain Loc --   ?   Pain Edu? --   ?   Excl. in [place]? --   ? ?No data found. ? ?Updated Vital Signs ?BP 116/82 (BP Location: Left Arm)   Pulse 72   Temp 99 ?F (37.2 ?C) (Oral)   Resp 16   Ht '5\' 2"'$  (1.575  m)   Wt 144 lb (65.3 kg)   SpO2 97%   BMI 26.34 kg/m?  ? ?Visual Acuity ?Right Eye Distance: 20/40 ?Left Eye Distance: 20/40 ?Bilateral Distance: 20/25 (with correctiion) ?   ? ?Physical Exam ?Vitals and nursing note reviewed.  ?Constitutional:   ?   Appearance: Normal appearance. She is normal weight.  ?HENT:  ?   Head: Normocephalic and atraumatic.  ?   Right Ear: Tympanic membrane, ear canal and external ear normal.  ?   Left Ear: Tympanic membrane, ear canal and external ear normal.  ?   Mouth/Throat:  ?   Mouth: Mucous membranes are moist.  ?   Pharynx: Oropharynx is clear.  ?Eyes:  ?   Extraocular Movements: Extraocular movements intact.  ?   Conjunctiva/sclera: Conjunctivae normal.  ?   Pupils: Pupils are equal, round, and reactive to light.  ?   Comments: Right eye: Sclera with +3 injection, dried mucopurulent discharge noted of upper eyelashes and inner canthus  ?Cardiovascular:  ?   Rate and Rhythm: Normal rate and regular rhythm.  ?   Pulses: Normal pulses.  ?   Heart sounds: Normal heart sounds.  ?Pulmonary:  ?   Effort: Pulmonary effort is normal.  ?   Breath sounds: Normal breath sounds. No wheezing, rhonchi or rales.  ?Musculoskeletal:  ?   Cervical back: Normal range of motion and neck supple.  ?Skin: ?   General: Skin is warm and dry.  ?Neurological:  ?   General: No focal deficit present.  ?   Mental Status: She is alert and oriented to person, place, and time.  ? ? ? ?UC Treatments / Results  ?Labs ?(all labs ordered are listed, but only abnormal results are displayed) ?Labs Reviewed - No data to display ? ?EKG ? ? ?Radiology ?No results found. ? ?Procedures ?Procedures (including critical care time) ? ?Medications Ordered in UC ?Medications - No data to display ? ?Initial Impression / Assessment and Plan / UC Course  ?I have reviewed the triage vital signs and the nursing notes. ? ?Pertinent labs & imaging results that were available during my care of the patient were reviewed by me and  considered in my medical decision making (see chart for details). ? ?  ? ?MDM: 1.  Conjunctivitis of right eye, unspecified conjunctivitis type-Rx'd Vigamox. Advised patient to instill eyedrops as directed.  Advised patient if left eye develops similar symptoms may  treat left eye as well.  Advised patient if symptoms worsen and/or unresolved please follow-up with your optometrist/ophthalmologist for further evaluation.  Work note provided prior to discharge.  Patient discharged home, hemodynamically stable. ?Final Clinical Impressions(s) / UC Diagnoses  ? ?Final diagnoses:  ?Conjunctivitis of right eye, unspecified conjunctivitis type  ? ? ? ?Discharge Instructions   ? ?  ?Advised patient to instill eyedrops as directed.  Advised patient if left eye develops similar symptoms may treat left eye as well.  Advised patient if symptoms worsen and/or unresolved please follow-up with your optometrist/ophthalmologist for further evaluation. ? ? ? ? ?ED Prescriptions   ? ? Medication Sig Dispense Auth. Provider  ? moxifloxacin (VIGAMOX) 0.5 % ophthalmic solution Place 1 drop into the right eye 3 (three) times daily for 7 days. 3 mL Eliezer Lofts, FNP  ? ?  ? ?PDMP not reviewed this encounter. ?  ?Eliezer Lofts, [place] ?08/01/21 0901 ? ?  ?Eliezer Lofts, [place] ?08/01/21 [phone] ? ?

## 2021-08-01 NOTE — Discharge Instructions (Addendum)
Advised patient to instill eyedrops as directed.  Advised patient if left eye develops similar symptoms may treat left eye as well.  Advised patient if symptoms worsen and/or unresolved please follow-up with your optometrist/ophthalmologist for further evaluation. ?

## 2021-08-03 ENCOUNTER — Telehealth: Payer: Self-pay | Admitting: Emergency Medicine

## 2021-08-07 ENCOUNTER — Other Ambulatory Visit: Payer: Self-pay | Admitting: Family Medicine

## 2021-08-07 DIAGNOSIS — I10 Essential (primary) hypertension: Secondary | ICD-10-CM

## 2021-08-13 ENCOUNTER — Encounter: Payer: Self-pay | Admitting: Emergency Medicine

## 2021-08-13 ENCOUNTER — Emergency Department (INDEPENDENT_AMBULATORY_CARE_PROVIDER_SITE_OTHER)
Admission: EM | Admit: 2021-08-13 | Discharge: 2021-08-13 | Disposition: A | Discharge status: Home / Self Care | Payer: 59 | Source: Home / Self Care | Attending: Family Medicine | Admitting: Family Medicine

## 2021-08-13 DIAGNOSIS — H109 Unspecified conjunctivitis: Secondary | ICD-10-CM | POA: Diagnosis not present

## 2021-08-13 DIAGNOSIS — J22 Unspecified acute lower respiratory infection: Secondary | ICD-10-CM | POA: Diagnosis not present

## 2021-08-13 MED ORDER — BENZONATATE 200 MG PO CAPS: 200.0000 mg | ORAL_CAPSULE | Freq: Three times a day (TID) | ORAL | 0 refills | Status: DC | PRN

## 2021-08-13 MED ORDER — ERYTHROMYCIN 5 MG/GM OP OINT: 1.0000 "application " | TOPICAL_OINTMENT | Freq: Three times a day (TID) | OPHTHALMIC | 0 refills | Status: DC

## 2021-08-13 MED ORDER — AZITHROMYCIN 250 MG PO TABS: ORAL_TABLET | ORAL | 0 refills | Status: DC

## 2021-08-13 MED ORDER — PREDNISONE 20 MG PO TABS: 40.0000 mg | ORAL_TABLET | Freq: Every day | ORAL | 0 refills | Status: DC

## 2021-08-13 NOTE — Discharge Instructions (Signed)
Continue Flonase ?Take the Z-Pak as prescribed ?Use the eye ointment 3 times a day until eye is clear ?Take Mucinex DM 2 times a day for cough.  Increase fluids.  Use a humidifier.  This is to help with chest congestion ?Add prednisone once a day for 5 days ?Tessalon 200 mg 2-3 times a day for cough ?

## 2021-08-13 NOTE — ED Triage Notes (Signed)
Patient presents to Urgent Care with complaints of cough, nasal congestion since 1 week ago. Patient reported having non productive cough, greenish nasal congestion, cough keeping her up at night. Does have a wheeze. Using generic sudafed and nasal spray.  ? ?Patient reports being seen on 08/01/20 for conjunctivitis of right eye. The antibiotic eye drops has not helped. Still having right eye swelling and redness. Wanted to switch medication for her eye.  ?

## 2021-08-13 NOTE — ED Provider Notes (Signed)
?[place] ? ? ? ?CSN: [id number] ?Arrival date & time: 08/13/21  4665 ? ? ?  ? ?History   ?Chief Complaint ?Chief Complaint  ?Patient presents with  ? Cough  ? Nasal Congestion  ? ? ?HPI ?Tina Meyer is a 61 y.o. female.  ? ?HPI ? ?Patient is here for 2 medical problems.  First she has a foster child was 61 years old who has recurring pinkeye.  Patient also had pinkeye.  She was seen here and given moxifloxacin.  After 3 or 4 days she states her eyelid started to swell so she stopped the medicine.  She thought she was getting better but the pinkeye has returned.  She has redness and irritation in the right eye with yellow crusting.  No change in vision. ?She also has cough and cold and symptoms of been going on for a week and a half.  Yellow sinus pressure postnasal drip and sputum.  Shortness of breath.  Bad coughing spells that are keeping her awake.  Headache.  Scratchy and sore throat. ? ?Past Medical History:  ?Diagnosis Date  ? Depression   ? Hyperlipidemia   ? Hypertension   ? ? ?Patient Active Problem List  ? Diagnosis Date Noted  ? IFG (impaired fasting glucose) 12/14/2019  ? CKD (chronic kidney disease) stage 2, GFR 60-89 ml/min 12/14/2019  ? Mild aortic sclerosis 01/30/2016  ? Heart murmur 08/07/2011  ? DERMATITIS, ATOPIC 12/19/2010  ? Hyperlipidemia 05/27/2008  ? ANXIETY DEPRESSION 05/27/2008  ? HYPERTENSION, BENIGN 05/27/2008  ? INSOMNIA 05/27/2008  ? ? ?Past Surgical History:  ?Procedure Laterality Date  ? ABDOMINAL HYSTERECTOMY  2004  ? ? ?OB History   ?No obstetric history on file. ?  ? ? ? ?Home Medications   ? ?Prior to Admission medications   ?Medication Sig Start Date End Date Taking? Authorizing Provider  ?albuterol (VENTOLIN HFA) 108 (90 Base) MCG/ACT inhaler Inhale 2 puffs into the lungs every 6 (six) hours as needed for wheezing. 11/30/20  Yes Hali Marry, MD  ?ALPRAZolam Duanne Moron) 0.5 MG tablet TAKE 1 TABLET (0.5 MG TOTAL) BY MOUTH AT BEDTIME AS NEEDED FOR ANXIETY.  TRY NOT TO TAKE EVERY NIGHT. 07/07/21  Yes Hali Marry, MD  ?atorvastatin (LIPITOR) 40 MG tablet Take 1 tablet (40 mg total) by mouth daily. 11/30/20  Yes Hali Marry, MD  ?azithromycin (ZITHROMAX Z-PAK) 250 MG tablet Take 2 pills right away.  After this take 1 pill a day until gone 08/13/21  Yes Raylene Everts, MD  ?benzonatate (TESSALON) 200 MG capsule Take 1 capsule (200 mg total) by mouth 3 (three) times daily as needed for cough. 08/13/21  Yes Raylene Everts, MD  ?candesartan-hydrochlorothiazide (ATACAND HCT) 16-12.5 MG tablet TAKE 1 TABLET BY MOUTH EVERY DAY 08/07/21  Yes Hali Marry, MD  ?erythromycin ophthalmic ointment Place 1 application. into the left eye 3 (three) times daily. Place a 1/4 inch ribbon of ointment into the lower eyelid. 08/13/21  Yes Raylene Everts, MD  ?estradiol (ESTRACE) 0.1 MG/GM vaginal cream Place 1 g vaginally 3 (three) times a week. 11/30/20  Yes Hali Marry, MD  ?fluticasone [facility]) 50 MCG/ACT nasal spray SPRAY 2 SPRAYS INTO EACH NOSTRIL EVERY DAY 11/30/20  Yes Hali Marry, MD  ?fluvoxaMINE (LUVOX) 100 MG tablet TAKE 1 TABLET BY MOUTH EVERY DAY 08/07/21  Yes Hali Marry, MD  ?ipratropium (ATROVENT) 0.06 % nasal spray Place 2 sprays into both nostrils 4 (four) times daily.  07/11/21  Yes Hali Marry, MD  ?predniSONE (DELTASONE) 20 MG tablet Take 2 tablets (40 mg total) by mouth daily with breakfast. 08/13/21  Yes Raylene Everts, MD  ?traZODone (DESYREL) 50 MG tablet Take 0.5-1 tablets (25-50 mg total) by mouth daily. 07/07/21  Yes Hali Marry, MD  ? ? ?Family History ?Family History  ?Problem Relation Age of Onset  ? Asthma Mother   ? Breast cancer Maternal Aunt   ? Heart disease Maternal Aunt   ? ? ?Social History ?Social History  ? ?Tobacco Use  ? Smoking status: Never  ? Smokeless tobacco: Never  ?Vaping Use  ? Vaping Use: Never used  ?Substance Use Topics  ? Alcohol use: No  ?  Alcohol/week:  0.0 standard drinks  ? Drug use: No  ? ? ? ?Allergies   ?Lisinopril, Aspirin, Losartan, and Rosuvastatin ? ? ?Review of Systems ?Review of Systems ?See HPI ? ?Physical Exam ?Triage Vital Signs ?ED Triage Vitals  ?Enc Vitals Group  ?   BP 08/13/21 0945 117/80  ?   Pulse Rate 08/13/21 0945 80  ?   Resp 08/13/21 0945 18  ?   Temp 08/13/21 0945 98.7 ?F (37.1 ?C)  ?   Temp Source 08/13/21 0945 Oral  ?   SpO2 08/13/21 0945 96 %  ?   Weight --   ?   Height --   ?   Head Circumference --   ?   Peak Flow --   ?   Pain Score 08/13/21 0944 4  ?   Pain Loc --   ?   Pain Edu? --   ?   Excl. in [place]? --   ? ?No data found. ? ?Updated Vital Signs ?BP 117/80 (BP Location: Right Arm)   Pulse 80   Temp 98.7 ?F (37.1 ?C) (Oral)   Resp 18   SpO2 96%  ?   ? ?Physical Exam ?Constitutional:   ?   General: She is not in acute distress. ?   Appearance: She is well-developed. She is ill-appearing.  ?HENT:  ?   Head: Normocephalic and atraumatic.  ?   Right Ear: Tympanic membrane and ear canal normal.  ?   Left Ear: Tympanic membrane and ear canal normal.  ?   Nose: Congestion and rhinorrhea present.  ?   Mouth/Throat:  ?   Pharynx: Posterior oropharyngeal erythema present.  ?   Comments: Yellow postnasal drainage visible in the back of the throat ?Eyes:  ?   Pupils: Pupils are equal, round, and reactive to light.  ?   Comments: Right eye has conjunctival injection.  Tearing.  Yellow discharge  ?Cardiovascular:  ?   Rate and Rhythm: Normal rate and regular rhythm.  ?   Heart sounds: Normal heart sounds.  ?Pulmonary:  ?   Effort: Pulmonary effort is normal. No respiratory distress.  ?   Breath sounds: Wheezing and rhonchi present.  ?Abdominal:  ?   General: There is no distension.  ?   Palpations: Abdomen is soft.  ?Musculoskeletal:     ?   General: Normal range of motion.  ?   Cervical back: Normal range of motion.  ?Lymphadenopathy:  ?   Cervical: Cervical adenopathy present.  ?Skin: ?   General: Skin is warm and dry.  ?Neurological:  ?    Mental Status: She is alert.  ?Psychiatric:     ?   Mood and Affect: Mood normal.     ?  Behavior: Behavior normal.  ? ? ? ?UC Treatments / Results  ?Labs ?(all labs ordered are listed, but only abnormal results are displayed) ?Labs Reviewed - No data to display ? ?EKG ? ? ?Radiology ?No results found. ? ?Procedures ?Procedures (including critical care time) ? ?Medications Ordered in UC ?Medications - No data to display ? ?Initial Impression / Assessment and Plan / UC Course  ?I have reviewed the triage vital signs and the nursing notes. ? ?Pertinent labs & imaging results that were available during my care of the patient were reviewed by me and considered in my medical decision making (see chart for details). ? ?  ?Final Clinical Impressions(s) / UC Diagnoses  ? ?Final diagnoses:  ?Bacterial conjunctivitis of right eye  ?LRTI (lower respiratory tract infection)  ? ? ? ?Discharge Instructions   ? ?  ?Continue Flonase ?Take the Z-Pak as prescribed ?Use the eye ointment 3 times a day until eye is clear ?Take Mucinex DM 2 times a day for cough.  Increase fluids.  Use a humidifier.  This is to help with chest congestion ?Add prednisone once a day for 5 days ?Tessalon 200 mg 2-3 times a day for cough ? ? ?ED Prescriptions   ? ? Medication Sig Dispense Auth. Provider  ? azithromycin (ZITHROMAX Z-PAK) 250 MG tablet Take 2 pills right away.  After this take 1 pill a day until gone 6 tablet Raylene Everts, MD  ? erythromycin ophthalmic ointment Place 1 application. into the left eye 3 (three) times daily. Place a 1/4 inch ribbon of ointment into the lower eyelid. 1 g Raylene Everts, MD  ? predniSONE (DELTASONE) 20 MG tablet Take 2 tablets (40 mg total) by mouth daily with breakfast. 10 tablet Raylene Everts, MD  ? benzonatate (TESSALON) 200 MG capsule Take 1 capsule (200 mg total) by mouth 3 (three) times daily as needed for cough. 21 capsule Raylene Everts, MD  ? ?  ? ?PDMP not reviewed this encounter. ?   ?Raylene Everts, MD ?08/13/21 1040 ? ?

## 2021-08-18 ENCOUNTER — Telehealth: Payer: Self-pay

## 2021-08-31 NOTE — Telephone Encounter (Signed)
No additional noted from provider. Will close encounter.

## 2021-09-01 ENCOUNTER — Encounter: Payer: Self-pay | Admitting: Family Medicine

## 2021-09-02 ENCOUNTER — Other Ambulatory Visit: Payer: Self-pay | Admitting: Family Medicine

## 2021-09-02 DIAGNOSIS — F341 Dysthymic disorder: Secondary | ICD-10-CM

## 2021-11-01 ENCOUNTER — Telehealth: Payer: Self-pay | Admitting: Family Medicine

## 2021-11-01 ENCOUNTER — Encounter: Payer: Self-pay | Admitting: Family Medicine

## 2021-11-01 ENCOUNTER — Ambulatory Visit (INDEPENDENT_AMBULATORY_CARE_PROVIDER_SITE_OTHER): Payer: 59 | Admitting: Family Medicine

## 2021-11-01 VITALS — BP 121/65 | HR 74 | Ht 61.0 in | Wt 145.0 lb

## 2021-11-01 DIAGNOSIS — I10 Essential (primary) hypertension: Secondary | ICD-10-CM | POA: Diagnosis not present

## 2021-11-01 DIAGNOSIS — F341 Dysthymic disorder: Secondary | ICD-10-CM

## 2021-11-01 DIAGNOSIS — R7301 Impaired fasting glucose: Secondary | ICD-10-CM | POA: Diagnosis not present

## 2021-11-01 DIAGNOSIS — R051 Acute cough: Secondary | ICD-10-CM

## 2021-11-01 LAB — POCT GLYCOSYLATED HEMOGLOBIN (HGB A1C): Hemoglobin A1C: 5.6 % (ref 4.0–5.6)

## 2021-11-01 MED ORDER — ALPRAZOLAM 0.5 MG PO TABS: 0.5000 mg | ORAL_TABLET | Freq: Every evening | ORAL | 1 refills | Status: DC | PRN

## 2021-11-01 MED ORDER — BENZONATATE 100 MG PO CAPS: 100.0000 mg | ORAL_CAPSULE | Freq: Two times a day (BID) | ORAL | 0 refills | Status: DC | PRN

## 2021-11-01 NOTE — Assessment & Plan Note (Signed)
Well controlled. Continue current regimen. Follow up in  6 mo  

## 2021-11-01 NOTE — Telephone Encounter (Signed)
Please call [employer] to see if they can ship out a new Cologuard kit.  If we reorder it it will be canceled since we had just ordered one a couple of months ago so we will have to actually call them and get them to send a new one.

## 2021-11-01 NOTE — Progress Notes (Signed)
Established Patient Office Visit  Subjective   Patient ID: Tina Meyer, female    DOB: [birth year]  Age: 61 y.o. MRN: [id number]  Chief Complaint  Patient presents with   Hypertension   ifg   Cough    HPI   Hypertension- Pt denies chest pain, SOB, dizziness, or heart palpitations.  Taking meds as directed w/o problems.  Denies medication side effects.    Impaired fasting glucose-no increased thirst or urination. No symptoms consistent with hypoglycemia.  Cough, productive  x 5 days. Now getting up green sputum.  No sinus sxs. No fever or chills, etc.  NO ST or ear pain.  Says feels like it started after she slept under her fan Sat night.       ROS    Objective:     BP 121/65   Pulse 74   Ht '5\' 1"'$  (1.549 m)   Wt 145 lb (65.8 kg)   SpO2 95%   BMI 27.40 kg/m    Physical Exam Vitals and nursing note reviewed.  Constitutional:      Appearance: She is well-developed.  HENT:     Head: Normocephalic and atraumatic.     Right Ear: Tympanic membrane, ear canal and external ear normal.     Left Ear: Tympanic membrane, ear canal and external ear normal.     Nose: Nose normal.  Eyes:     Conjunctiva/sclera: Conjunctivae normal.     Pupils: Pupils are equal, round, and reactive to light.  Neck:     Thyroid: No thyromegaly.  Cardiovascular:     Rate and Rhythm: Normal rate and regular rhythm.     Heart sounds: Normal heart sounds.  Pulmonary:     Effort: Pulmonary effort is normal.     Breath sounds: Normal breath sounds. No wheezing.  Musculoskeletal:     Cervical back: Neck supple.  Lymphadenopathy:     Cervical: No cervical adenopathy.  Skin:    General: Skin is warm and dry.  Neurological:     Mental Status: She is alert and oriented to person, place, and time.  Psychiatric:        Behavior: Behavior normal.      Results for orders placed or performed in visit on 11/01/21  POCT glycosylated hemoglobin (Hb A1C)  Result Value Ref Range    Hemoglobin A1C 5.6 4.0 - 5.6 %   HbA1c POC (<> result, manual entry)     HbA1c, POC (prediabetic range)     HbA1c, POC (controlled diabetic range)        The 10-year ASCVD risk score (Arnett DK, et al., 2019) is: 4.4%    Assessment & Plan:   Problem List Items Addressed This Visit       Cardiovascular and Mediastinum   HYPERTENSION, BENIGN - Primary    Well controlled. Continue current regimen. Follow up in  6 mo       Relevant Orders   BASIC METABOLIC PANEL WITH GFR     Endocrine   IFG (impaired fasting glucose)    Well controlled. Continue current regimen. Follow up in  6 mo       Relevant Orders   POCT glycosylated hemoglobin (Hb A1C) (Completed)   BASIC METABOLIC PANEL WITH GFR     Other   ANXIETY DEPRESSION   Relevant Medications   ALPRAZolam (XANAX) 0.5 MG tablet   Other Visit Diagnoses     Acute cough       Relevant Medications  benzonatate (TESSALON) 100 MG capsule      Cough - likely viral-recommend a trial of Mucinex for the next couple days as well as drinking plenty of fluids running humidifier.  Ladona Ridgel sent to pharmacy.  If not improving then please let us know.  If suddenly develops new or worsening symptoms then please let us know as well.  Return in about 6 months (around 05/04/2022) for Hypertension.    Beatrice Lecher, MD

## 2021-11-02 LAB — BASIC METABOLIC PANEL WITH GFR
BUN/Creatinine Ratio: 17 (calc) (ref 6–22)
BUN: 18 mg/dL (ref 7–25)
CO2: 27 mmol/L (ref 20–32)
Calcium: 9.3 mg/dL (ref 8.6–10.4)
Chloride: 106 mmol/L (ref 98–110)
Creat: 1.06 mg/dL — ABNORMAL HIGH (ref 0.50–1.05)
Glucose, Bld: 91 mg/dL (ref 65–99)
Potassium: 4.2 mmol/L (ref 3.5–5.3)
Sodium: 142 mmol/L (ref 135–146)
eGFR: 60 mL/min/{1.73_m2} (ref >=60)

## 2021-11-02 NOTE — Telephone Encounter (Signed)
[employer] and they will send a new kit.

## 2021-11-02 NOTE — Telephone Encounter (Signed)
error 

## 2021-11-02 NOTE — Progress Notes (Signed)
Your lab work is within acceptable range and there are no concerning findings.   ?

## 2021-11-07 ENCOUNTER — Other Ambulatory Visit: Payer: Self-pay | Admitting: Family Medicine

## 2021-11-07 DIAGNOSIS — I10 Essential (primary) hypertension: Secondary | ICD-10-CM

## 2021-11-10 NOTE — Progress Notes (Unsigned)
   GYNECOLOGY OFFICE VISIT NOTE  History:   Tina Meyer is a 61 y.o. G0 here today for vaginal dryness and irritation. Her right labia has been more itchy than her left and sometimes is unbearable. She had been given estrace by Dr. Madilyn Fireman but had trouble remembering to use it.   She is s/p TAH/BSO for pain many years ago (2004).     Past Medical History:  Diagnosis Date   Depression    Hyperlipidemia    Hypertension     Past Surgical History:  Procedure Laterality Date   ABDOMINAL HYSTERECTOMY  04/16/2002   TOTAL ABDOMINAL HYSTERECTOMY  2004    The following portions of the patient's history were reviewed and updated as appropriate: allergies, current medications, past family history, past medical history, past social history, past surgical history and problem list.   Health Maintenance:   She had a hysterectomy  Normal mammogram on 05/2020.   Review of Systems:  Pertinent items noted in HPI and remainder of comprehensive ROS otherwise negative.  Physical Exam:  BP 132/79   Pulse 83   Ht '5\' 1"'$  (1.549 m)   Wt 145 lb (65.8 kg)   BMI 27.40 kg/m  CONSTITUTIONAL: Well-developed, well-nourished female in no acute distress.  HEENT:  Normocephalic, atraumatic. External right and left ear normal. No scleral icterus.  NECK: Normal range of motion, supple, no masses noted on observation SKIN: No rash noted. Not diaphoretic. No erythema. No pallor. MUSCULOSKELETAL: Normal range of motion. No edema noted. NEUROLOGIC: Alert and oriented to person, place, and time. Normal muscle tone coordination. No cranial nerve deficit noted. PSYCHIATRIC: Normal mood and affect. Normal behavior. Normal judgment and thought content.  CARDIOVASCULAR: Normal heart rate noted RESPIRATORY: Effort and breath sounds normal, no problems with respiration noted ABDOMEN: No masses noted. No other overt distention noted.    PELVIC: Normal appearing external genitalia except some excoriation and  mildly ulceration of the right labia that is along the inner edge on the labia majora - nontender to the touch; normal urethral meatus; normal appearing vaginal mucosa - only mildly atrophic.  No abnormal discharge noted. Cervix confirmed surgically absent at pt request. Performed in the presence of a chaperone  Labs and Imaging No results found for this or any previous visit (from the past 168 hour(s)). No results found.  Assessment and Plan:   1. Vaginal dryness, menopausal - Vaginal dryness most likely due to being postmenopausal - she will try estrace weekly. We discussed different formulations. She already has rx from Dr. Madilyn Fireman.   Vulvar irritation - Vaginal irritation most likely contact dermatitis - could be soaps. Discussed cetaphil 5. Will do kenalog taper.  - Cultures done to confirm no infectious etiology - Return prn -     triamcinolone (KENALOG) 0.025 % ointment; Apply 1 Application topically 2 (two) times daily. Twice a day for 2 weeks, the nightly for 2 weeks then as needed.   Routine preventative health maintenance measures emphasized. Please refer to After Visit Summary for other counseling recommendations.   Return if symptoms worsen or fail to improve.  Radene Gunning, MD, [place] for [facility], [place]

## 2021-11-16 ENCOUNTER — Other Ambulatory Visit (HOSPITAL_COMMUNITY)
Admission: RE | Admit: 2021-11-16 | Discharge: 2021-11-16 | Disposition: A | Discharge status: Home / Self Care | Payer: 59 | Source: Ambulatory Visit | Attending: Obstetrics and Gynecology | Admitting: Obstetrics and Gynecology

## 2021-11-16 ENCOUNTER — Ambulatory Visit (INDEPENDENT_AMBULATORY_CARE_PROVIDER_SITE_OTHER): Payer: 59 | Admitting: Obstetrics and Gynecology

## 2021-11-16 ENCOUNTER — Encounter: Payer: Self-pay | Admitting: Obstetrics and Gynecology

## 2021-11-16 VITALS — BP 132/79 | HR 83 | Ht 61.0 in | Wt 145.0 lb

## 2021-11-16 DIAGNOSIS — N9089 Other specified noninflammatory disorders of vulva and perineum: Secondary | ICD-10-CM | POA: Diagnosis not present

## 2021-11-16 DIAGNOSIS — N951 Menopausal and female climacteric states: Secondary | ICD-10-CM | POA: Diagnosis not present

## 2021-11-16 MED ORDER — TRIAMCINOLONE ACETONIDE 0.025 % EX OINT: 1.0000 | TOPICAL_OINTMENT | Freq: Two times a day (BID) | CUTANEOUS | Status: DC

## 2021-11-17 LAB — CERVICOVAGINAL ANCILLARY ONLY
Bacterial Vaginitis (gardnerella): NEGATIVE
Candida Glabrata: NEGATIVE
Candida Vaginitis: NEGATIVE
Comment: NEGATIVE
Comment: NEGATIVE
Comment: NEGATIVE

## 2021-12-25 ENCOUNTER — Ambulatory Visit
Admission: EM | Admit: 2021-12-25 | Discharge: 2021-12-25 | Disposition: A | Discharge status: Home / Self Care | Payer: 59 | Attending: Family Medicine | Admitting: Family Medicine

## 2021-12-25 DIAGNOSIS — R110 Nausea: Secondary | ICD-10-CM | POA: Insufficient documentation

## 2021-12-25 DIAGNOSIS — Z20822 Contact with and (suspected) exposure to covid-19: Secondary | ICD-10-CM | POA: Insufficient documentation

## 2021-12-25 DIAGNOSIS — N10 Acute tubulo-interstitial nephritis: Secondary | ICD-10-CM | POA: Insufficient documentation

## 2021-12-25 LAB — POCT URINALYSIS DIP (MANUAL ENTRY)
Bilirubin, UA: NEGATIVE
Glucose, UA: NEGATIVE mg/dL
Ketones, POC UA: NEGATIVE mg/dL
Nitrite, UA: POSITIVE — AB
Protein Ur, POC: 100 mg/dL — AB
Spec Grav, UA: 1.02
Urobilinogen, UA: 0.2 U/dL
pH, UA: 5.5

## 2021-12-25 LAB — RESP PANEL BY RT-PCR (FLU A&B, COVID) ARPGX2
Influenza A by PCR: NEGATIVE
Influenza B by PCR: NEGATIVE
SARS Coronavirus 2 by RT PCR: NEGATIVE

## 2021-12-25 MED ORDER — LEVOFLOXACIN 750 MG PO TABS: ORAL_TABLET | ORAL | 0 refills | Status: DC

## 2021-12-25 NOTE — ED Triage Notes (Signed)
Pt presents with c/o body aches, abdominal pain, diarrhea, and chills that began yesterday morning.

## 2021-12-25 NOTE — Discharge Instructions (Signed)
Increase fluid intake.  May take Tylenol as needed for pain, fever, etc.  If COVID19 test is positive, begin Paxlovid (then stop taking atorvastatin, trazodone, and alprazolam).  If your COVID-19 test is positive, isolate yourself for five days from today. At the end of five days you may end isolation if your symptoms have cleared or improved, and you have not had a fever for 24 hours. At this time you should wear a mask for five more days when you are around others.   If symptoms become significantly worse during the night or over the weekend, proceed to the local emergency room.

## 2021-12-25 NOTE — ED Provider Notes (Signed)
[facility]    CSN: [id number] Arrival date & time: 12/25/21  1238      History   Chief Complaint Chief Complaint  Patient presents with   Diarrhea   Abdominal Pain    HPI Tina Meyer is a 61 y.o. female.   Yesterday patient developed loose stools, "bladder pain," nausea without vomiting, myalgias, chills, headache, and right back pain.  She denies respiratory symptoms.  The history is provided by the patient.    Past Medical History:  Diagnosis Date   Depression    Hyperlipidemia    Hypertension     Patient Active Problem List   Diagnosis Date Noted   IFG (impaired fasting glucose) 12/14/2019   CKD (chronic kidney disease) stage 2, GFR 60-89 ml/min 12/14/2019   Mild aortic sclerosis 01/30/2016   Heart murmur 08/07/2011   DERMATITIS, ATOPIC 12/19/2010   Hyperlipidemia 05/27/2008   ANXIETY DEPRESSION 05/27/2008   HYPERTENSION, BENIGN 05/27/2008   INSOMNIA 05/27/2008    Past Surgical History:  Procedure Laterality Date   ABDOMINAL HYSTERECTOMY  04/16/2002   TOTAL ABDOMINAL HYSTERECTOMY  2004    OB History     Gravida  0   Para  0   Term  0   Preterm  0   AB  0   Living  0      SAB  0   IAB  0   Ectopic  0   Multiple  0   Live Births  0            Home Medications    Prior to Admission medications   Medication Sig Start Date End Date Taking? Authorizing Provider  levofloxacin (LEVAQUIN) 750 MG tablet Take one tab PO once daily for five days 12/25/21  Yes Joh Rao, Ishmael Holter, MD  albuterol (VENTOLIN HFA) 108 (90 Base) MCG/ACT inhaler Inhale 2 puffs into the lungs every 6 (six) hours as needed for wheezing. 11/30/20   Hali Marry, MD  ALPRAZolam Duanne Moron) 0.5 MG tablet Take 1 tablet (0.5 mg total) by mouth at bedtime as needed for anxiety. Try not to take every night. 11/01/21   Hali Marry, MD  atorvastatin (LIPITOR) 40 MG tablet Take 1 tablet (40 mg total) by mouth daily. 11/30/20   Hali Marry, MD  benzonatate (TESSALON) 100 MG capsule Take 1 capsule (100 mg total) by mouth 2 (two) times daily as needed for cough. Patient not taking: Reported on 12/25/2021 11/01/21   Hali Marry, MD  candesartan-hydrochlorothiazide (ATACAND HCT) 16-12.5 MG tablet TAKE 1 TABLET BY MOUTH EVERY DAY 11/07/21   Hali Marry, MD  estradiol (ESTRACE) 0.1 MG/GM vaginal cream Place 1 g vaginally 3 (three) times a week. 11/30/20   Hali Marry, MD  fluticasone (FLONASE) 50 MCG/ACT nasal spray SPRAY 2 SPRAYS INTO EACH NOSTRIL EVERY DAY 11/30/20   Hali Marry, MD  fluvoxaMINE (LUVOX) 100 MG tablet TAKE 1 TABLET BY MOUTH EVERY DAY 08/07/21   Hali Marry, MD  ipratropium (ATROVENT) 0.06 % nasal spray Place 2 sprays into both nostrils 4 (four) times daily. 07/11/21   Hali Marry, MD  traZODone (DESYREL) 50 MG tablet Take 0.5-1 tablets (25-50 mg total) by mouth daily. 07/07/21   Hali Marry, MD  triamcinolone (KENALOG) 0.025 % ointment Apply 1 Application topically 2 (two) times daily. Twice a day for 2 weeks, the nightly for 2 weeks then as needed. 11/16/21   Radene Gunning, MD  Family History Family History  Problem Relation Age of Onset   Asthma Mother    Breast cancer Maternal Aunt    Heart disease Maternal Aunt     Social History Social History   Tobacco Use   Smoking status: Never   Smokeless tobacco: Never  Vaping Use   Vaping Use: Never used  Substance Use Topics   Alcohol use: No    Alcohol/week: 0.0 standard drinks of alcohol   Drug use: No     Allergies   Lisinopril, Aspirin, Losartan, and Rosuvastatin   Review of Systems Review of Systems  Constitutional:  Positive for activity change, appetite change, chills and fatigue.  HENT: Negative.    Eyes: Negative.   Respiratory: Negative.    Gastrointestinal:  Positive for abdominal pain and nausea. Negative for abdominal distention and vomiting.  Genitourinary:   Positive for flank pain. Negative for dysuria, frequency, hematuria, pelvic pain and vaginal discharge.  Musculoskeletal:  Positive for myalgias.  Skin: Negative.   Neurological:  Positive for headaches.     Physical Exam Triage Vital Signs ED Triage Vitals  Enc Vitals Group     BP 12/25/21 1335 134/82     Pulse Rate 12/25/21 1335 87     Resp --      Temp 12/25/21 1335 99.7 F (37.6 C)     Temp Source 12/25/21 1335 Oral     SpO2 12/25/21 1335 96 %     Weight --      Height --      Head Circumference --      Peak Flow --      Pain Score 12/25/21 1334 9     Pain Loc --      Pain Edu? --      Excl. in [place]? --    No data found.  Updated Vital Signs BP 134/82 (BP Location: Right Arm)   Pulse 87   Temp 99.7 F (37.6 C) (Oral)   SpO2 96%   Visual Acuity Right Eye Distance:   Left Eye Distance:   Bilateral Distance:    Right Eye Near:   Left Eye Near:    Bilateral Near:     Physical Exam Nursing notes and Vital Signs reviewed. Appearance:  Patient appears stated age, and in no acute distress.    Eyes:  Pupils are equal, round, and reactive to light and accomodation.  Extraocular movement is intact.  Conjunctivae are not inflamed   Pharynx:  Normal; moist mucous membranes  Neck:  Supple.  No adenopathy Lungs:  Clear to auscultation.  Breath sounds are equal.  Moving air well. Heart:  Regular rate and rhythm without murmurs, rubs, or gallops.  Abdomen:   Mild tenderness over bladder without masses or hepatosplenomegaly.  Bowel sounds are present.  Right flank tenderness is present. Extremities:  No edema.  Skin:  No rash present.     UC Treatments / Results  Labs (all labs ordered are listed, but only abnormal results are displayed) Labs Reviewed  POCT URINALYSIS DIP (MANUAL ENTRY) - Abnormal; Notable for the following components:      Result Value   Clarity, UA cloudy (*)    Blood, UA large (*)    Protein Ur, POC =100 (*)    Nitrite, UA Positive (*)     Leukocytes, UA Small (1+) (*)    All other components within normal limits  URINE CULTURE  RESP PANEL BY RT-PCR (FLU A&B, COVID) [id number]    EKG  Radiology No results found.  Procedures Procedures (including critical care time)  Medications Ordered in UC Medications - No data to display  Initial Impression / Assessment and Plan / UC Course  I have reviewed the triage vital signs and the nursing notes.  Pertinent labs & imaging results that were available during my care of the patient were reviewed by me and considered in my medical decision making (see chart for details).    Urine culture pending.  Begin Levaquin '750mg'$ . ?COVID.  COVID19 PCR pending.  Rx for Paxlovid 330/100 to hold. Followup with Family Doctor if not improved in about 4 days.  Final Clinical Impressions(s) / UC Diagnoses   Final diagnoses:  Acute pyelonephritis     Discharge Instructions      Increase fluid intake.  May take Tylenol as needed for pain, fever, etc.  If COVID19 test is positive, begin Paxlovid (then stop taking atorvastatin, trazodone, and alprazolam).  If your COVID-19 test is positive, isolate yourself for five days from today. At the end of five days you may end isolation if your symptoms have cleared or improved, and you have not had a fever for 24 hours. At this time you should wear a mask for five more days when you are around others.   If symptoms become significantly worse during the night or over the weekend, proceed to the local emergency room.         ED Prescriptions     Medication Sig Dispense Auth. Provider   levofloxacin (LEVAQUIN) 750 MG tablet Take one tab PO once daily for five days 5 tablet Kandra Nicolas, MD         Kandra Nicolas, MD 12/27/21 2149

## 2021-12-26 ENCOUNTER — Telehealth: Payer: Self-pay

## 2021-12-26 NOTE — Telephone Encounter (Signed)
Tried to call pt to check on status since UC visit. No answer and mailbox is full.

## 2021-12-27 LAB — URINE CULTURE: Culture: >=100000 — AB

## 2022-01-07 ENCOUNTER — Other Ambulatory Visit: Payer: Self-pay

## 2022-01-07 ENCOUNTER — Ambulatory Visit
Admission: EM | Admit: 2022-01-07 | Discharge: 2022-01-07 | Disposition: A | Discharge status: Home / Self Care | Payer: 59 | Attending: Emergency Medicine | Admitting: Emergency Medicine

## 2022-01-07 DIAGNOSIS — N390 Urinary tract infection, site not specified: Secondary | ICD-10-CM | POA: Diagnosis present

## 2022-01-07 LAB — POCT URINALYSIS DIP (MANUAL ENTRY)
Bilirubin, UA: NEGATIVE
Glucose, UA: NEGATIVE mg/dL
Ketones, POC UA: NEGATIVE mg/dL
Nitrite, UA: NEGATIVE
Protein Ur, POC: NEGATIVE mg/dL
Spec Grav, UA: 1.02 (ref 1.010–1.025)
Urobilinogen, UA: 0.2 E.U./dL
pH, UA: 5.5 (ref 5.0–8.0)

## 2022-01-07 MED ORDER — PHENAZOPYRIDINE HCL 200 MG PO TABS: 200.0000 mg | ORAL_TABLET | Freq: Three times a day (TID) | ORAL | 0 refills | Status: DC | PRN

## 2022-01-07 MED ORDER — CEPHALEXIN 500 MG PO CAPS: 500.0000 mg | ORAL_CAPSULE | Freq: Four times a day (QID) | ORAL | 0 refills | Status: AC

## 2022-01-07 NOTE — Discharge Instructions (Addendum)
Finish the Keflex, even if you feel better.  The Pyridium will help with your symptoms.  Continue drinking plenty of extra fluids.  We will contact you if we need to change your antibiotics based on your urine culture.  Restart your vaginal cream.  If your irritation/itching persists, then we may consider BV and/or yeast vaginitis.

## 2022-01-07 NOTE — ED Triage Notes (Signed)
Pt presents to Urgent Care with c/o dysuria, urinary frequency, and lower back pain x 3-4 days. Reports being dx w/  UTI approx 2 weeks ago; completed antibiotic and was free of s/s for only 3-4 days.

## 2022-01-07 NOTE — ED Provider Notes (Signed)
HPI  SUBJECTIVE:  Tina Meyer is a 61 y.o. female who presents with 3 to 4 days of dysuria, urgency, frequency, low midline pelvic pain that radiates to her back, worse on the right side.  No hematuria, vomiting, fevers, body aches.  No vaginal odor, bleeding, rash or discharge.  She reports mild vaginal itching, but has not used her vaginal cream in a while.  She states that she has not been sexually active in "years".  No antipyretic in the past 6 hours.  She tried increasing her fluids without improvement in her symptoms.  Symptoms are worse with urination.  She states that she does not feel as bad as she did when she presented with pyelonephritis last time.  Patient was seen here 9/11, found to have pyelonephritis, started on 5 days of Levaquin.  Urine culture grew out E. coli that was pansensitive.  Patient states that she finished all of the Levaquin and felt better for about 3 days, then her symptoms returned.  She has a past medical history of UTI, pyelonephritis, atrophic vaginitis, hypertension, hypercholesterolemia.  PCP: [facility]  Past Medical History:  Diagnosis Date   Depression    Hyperlipidemia    Hypertension     Past Surgical History:  Procedure Laterality Date   ABDOMINAL HYSTERECTOMY  04/16/2002   TOTAL ABDOMINAL HYSTERECTOMY  2004    Family History  Problem Relation Age of Onset   Asthma Mother    Cerebral aneurysm Father    Breast cancer Maternal Aunt    Heart disease Maternal Aunt     Social History   Tobacco Use   Smoking status: Never   Smokeless tobacco: Never  Vaping Use   Vaping Use: Never used  Substance Use Topics   Alcohol use: No    Alcohol/week: 0.0 standard drinks of alcohol   Drug use: No    No current facility-administered medications for this encounter.  Current Outpatient Medications:    cephALEXin (KEFLEX) 500 MG capsule, Take 1 capsule (500 mg total) by mouth 4 (four) times daily for 10 days., Disp: 40  capsule, Rfl: 0   phenazopyridine (PYRIDIUM) 200 MG tablet, Take 1 tablet (200 mg total) by mouth 3 (three) times daily as needed for pain., Disp: 6 tablet, Rfl: 0   albuterol (VENTOLIN HFA) 108 (90 Base) MCG/ACT inhaler, Inhale 2 puffs into the lungs every 6 (six) hours as needed for wheezing., Disp: 8 g, Rfl: 2   ALPRAZolam (XANAX) 0.5 MG tablet, Take 1 tablet (0.5 mg total) by mouth at bedtime as needed for anxiety. Try not to take every night., Disp: 30 tablet, Rfl: 1   atorvastatin (LIPITOR) 40 MG tablet, Take 1 tablet (40 mg total) by mouth daily., Disp: 30 tablet, Rfl: 11   candesartan-hydrochlorothiazide (ATACAND HCT) 16-12.5 MG tablet, TAKE 1 TABLET BY MOUTH EVERY DAY, Disp: 90 tablet, Rfl: 0   estradiol (ESTRACE) 0.1 MG/GM vaginal cream, Place 1 g vaginally 3 (three) times a week., Disp: 42.5 g, Rfl: 11   fluticasone (FLONASE) 50 MCG/ACT nasal spray, SPRAY 2 SPRAYS INTO EACH NOSTRIL EVERY DAY, Disp: 16 g, Rfl: 3   fluvoxaMINE (LUVOX) 100 MG tablet, TAKE 1 TABLET BY MOUTH EVERY DAY, Disp: 90 tablet, Rfl: 1   ipratropium (ATROVENT) 0.06 % nasal spray, Place 2 sprays into both nostrils 4 (four) times daily., Disp: 15 mL, Rfl: 2   traZODone (DESYREL) 50 MG tablet, Take 0.5-1 tablets (25-50 mg total) by mouth daily., Disp: 90 tablet, Rfl: 3  triamcinolone (KENALOG) 0.025 % ointment, Apply 1 Application topically 2 (two) times daily. Twice a day for 2 weeks, the nightly for 2 weeks then as needed., Disp: 80 g, Rfl: 02  Allergies  Allergen Reactions   Ace Inhibitors Anaphylaxis   Lisinopril Anaphylaxis    REACTION: anaphalactic   Aspirin Nausea Only    REACTION: nausea   Losartan Other (See Comments)    Hair Loss   Rosuvastatin     REACTION: myalgias     ROS  As noted in HPI.   Physical Exam  BP 108/72 (BP Location: Right Arm)   Pulse 86   Temp 98.8 F (37.1 C) (Oral)   Resp 20   Ht '5\' 2"'$  (1.575 m)   Wt 65.8 kg   SpO2 94%   BMI 26.52 kg/m   Constitutional: Well  developed, well nourished, no acute distress Eyes:  EOMI, conjunctiva normal bilaterally HENT: Normocephalic, atraumatic,mucus membranes moist Respiratory: Normal inspiratory effort Cardiovascular: Normal rate GI: nondistended.  Soft.  Positive suprapubic, right flank tenderness. Back: Very mild right CVAT skin: No rash, skin intact Musculoskeletal: no deformities Neurologic: Alert & oriented x 3, no focal neuro deficits Psychiatric: Speech and behavior appropriate   ED Course   Medications - No data to display  Orders Placed This Encounter  Procedures   Urine Culture    Standing Status:   Standing    Number of Occurrences:   1    Order Specific Question:   Indication    Answer:   Dysuria   POCT urinalysis dipstick    Standing Status:   Standing    Number of Occurrences:   1    Results for orders placed or performed during the hospital encounter of 01/07/22 (from the past 24 hour(s))  POCT urinalysis dipstick     Status: Abnormal   Collection Time: 01/07/22 10:50 AM  Result Value Ref Range   Color, UA yellow yellow   Clarity, UA clear clear   Glucose, UA negative negative mg/dL   Bilirubin, UA negative negative   Ketones, POC UA negative negative mg/dL   Spec Grav, UA 1.020 1.010 - 1.025   Blood, UA trace-intact (A) negative   pH, UA 5.5 5.0 - 8.0   Protein Ur, POC negative negative mg/dL   Urobilinogen, UA 0.2 0.2 or 1.0 E.U./dL   Nitrite, UA Negative Negative   Leukocytes, UA Trace (A) Negative   No results found.  ED Clinical Impression  1. Complicated urinary tract infection      ED Assessment/Plan     Creatinine clearance from labs done in June 58 mL/min.  Do not need to renally dose Pyridium or Keflex.  Previous records, labs reviewed.  As noted in HPI.  Concern for ascending/complicated urinary tract infection with the flank and mild CVAT.  However, her vitals are normal, she does not appear toxic, I do not think that she has pyelonephritis just  yet. will send home with Keflex for 10 days, Pyridium.  Urine culture sent to confirm antibiotic choice.  Follow-up with PCP as needed.  She also reports some vaginal itching, but no discharge.  I suspect this is from the atrophic vaginitis as she has not been using her cream recently.  She will follow-up with her primary care provider if her symptoms persist after restarting the Estrace.   Discussed labs,MDM, treatment plan, and plan for follow-up with patient. patient agrees with plan.   Meds ordered this encounter  Medications   phenazopyridine (PYRIDIUM) 200  MG tablet    Sig: Take 1 tablet (200 mg total) by mouth 3 (three) times daily as needed for pain.    Dispense:  6 tablet    Refill:  0   cephALEXin (KEFLEX) 500 MG capsule    Sig: Take 1 capsule (500 mg total) by mouth 4 (four) times daily for 10 days.    Dispense:  40 capsule    Refill:  0      *This clinic note was created using [employer]. Therefore, there may be occasional mistakes despite careful proofreading.  ?    Melynda Ripple, MD 01/08/22 [phone]

## 2022-01-08 ENCOUNTER — Telehealth: Payer: Self-pay | Admitting: Emergency Medicine

## 2022-01-08 LAB — URINE CULTURE: Culture: <10000 — AB

## 2022-01-08 NOTE — Telephone Encounter (Signed)
Attempted to contact patient, unable to leave a message due to mailbox being full. 

## 2022-01-21 ENCOUNTER — Other Ambulatory Visit: Payer: Self-pay | Admitting: Family Medicine

## 2022-01-21 DIAGNOSIS — E7849 Other hyperlipidemia: Secondary | ICD-10-CM

## 2022-02-03 ENCOUNTER — Other Ambulatory Visit: Payer: Self-pay | Admitting: Family Medicine

## 2022-02-04 ENCOUNTER — Other Ambulatory Visit: Payer: Self-pay | Admitting: Family Medicine

## 2022-02-04 DIAGNOSIS — I10 Essential (primary) hypertension: Secondary | ICD-10-CM

## 2022-04-19 ENCOUNTER — Other Ambulatory Visit: Payer: Self-pay | Admitting: Family Medicine

## 2022-04-19 DIAGNOSIS — I10 Essential (primary) hypertension: Secondary | ICD-10-CM

## 2022-04-19 DIAGNOSIS — F341 Dysthymic disorder: Secondary | ICD-10-CM

## 2022-05-09 ENCOUNTER — Encounter: Payer: Self-pay | Admitting: Family Medicine

## 2022-05-09 ENCOUNTER — Ambulatory Visit (INDEPENDENT_AMBULATORY_CARE_PROVIDER_SITE_OTHER): Payer: Managed Care, Other (non HMO) | Admitting: Family Medicine

## 2022-05-09 ENCOUNTER — Ambulatory Visit: Payer: 59 | Admitting: Family Medicine

## 2022-05-09 VITALS — BP 138/74 | HR 75 | Ht 62.0 in | Wt 151.0 lb

## 2022-05-09 DIAGNOSIS — Z0000 Encounter for general adult medical examination without abnormal findings: Secondary | ICD-10-CM

## 2022-05-09 DIAGNOSIS — N952 Postmenopausal atrophic vaginitis: Secondary | ICD-10-CM | POA: Diagnosis not present

## 2022-05-09 DIAGNOSIS — Z1231 Encounter for screening mammogram for malignant neoplasm of breast: Secondary | ICD-10-CM | POA: Diagnosis not present

## 2022-05-09 MED ORDER — ESTRADIOL 0.1 MG/GM VA CREA: 1.0000 g | TOPICAL_CREAM | VAGINAL | 11 refills | Status: DC

## 2022-05-09 NOTE — Progress Notes (Signed)
Complete physical exam  Patient: Tina Meyer   DOB: 12-30-1960   62 y.o. Female  MRN: [id number]  Subjective:    Chief Complaint  Patient presents with   Annual Exam    Tina BARRACLOUGH is a 62 y.o. female who presents today for a complete physical exam. She reports consuming a general diet. The patient does not participate in regular exercise at present. She generally feels well.  She does not have additional problems to discuss today.   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   Most recent fall risk assessment:    05/09/2022    8:58 AM  Fall Risk   Falls in the past year? 0  Number falls in past yr: 0  Injury with Fall? 0  Risk for fall due to : No Fall Risks  Follow up Falls evaluation completed     Most recent depression screenings:    05/04/2021    8:58 AM 11/30/2020    8:58 AM  PHQ 2/9 Scores  PHQ - 2 Score 0 0        Patient Care Team: Hali Marry, MD as PCP - General Vena Rua, MD as Referring Physician (Obstetrics and Gynecology) Aleene Davidson, MD as Referring Physician (Obstetrics and Gynecology)   Outpatient Medications Prior to Visit  Medication Sig   albuterol (VENTOLIN HFA) 108 (90 Base) MCG/ACT inhaler Inhale 2 puffs into the lungs every 6 (six) hours as needed for wheezing.   ALPRAZolam (XANAX) 0.5 MG tablet TAKE 1 TABLET (0.5 MG TOTAL) BY MOUTH AT BEDTIME AS NEEDED FOR ANXIETY. TRY NOT TO TAKE EVERY NIGHT.   atorvastatin (LIPITOR) 40 MG tablet TAKE 1 TABLET BY MOUTH EVERY DAY   candesartan-hydrochlorothiazide (ATACAND HCT) 16-12.5 MG tablet TAKE 1 TABLET BY MOUTH EVERY DAY   fluticasone (FLONASE) 50 MCG/ACT nasal spray SPRAY 2 SPRAYS INTO EACH NOSTRIL EVERY DAY   fluvoxaMINE (LUVOX) 100 MG tablet TAKE 1 TABLET BY MOUTH EVERY DAY   ipratropium (ATROVENT) 0.06 % nasal spray Place 2 sprays into both nostrils 4 (four) times daily.   phenazopyridine (PYRIDIUM) 200 MG  tablet Take 1 tablet (200 mg total) by mouth 3 (three) times daily as needed for pain.   traZODone (DESYREL) 50 MG tablet Take 0.5-1 tablets (25-50 mg total) by mouth daily.   triamcinolone (KENALOG) 0.025 % ointment Apply 1 Application topically 2 (two) times daily. Twice a day for 2 weeks, the nightly for 2 weeks then as needed.   [DISCONTINUED] estradiol (ESTRACE) 0.1 MG/GM vaginal cream Place 1 g vaginally 3 (three) times a week.   No facility-administered medications prior to visit.    ROS        Objective:     BP 138/74   Pulse 75   Ht '5\' 2"'$  (1.575 m)   Wt 151 lb (68.5 kg)   SpO2 96%   BMI 27.62 kg/m    Physical Exam Vitals and nursing note reviewed.  Constitutional:      Appearance: Normal appearance. She is well-developed.  HENT:     Head: Normocephalic and atraumatic.     Right Ear: Tympanic membrane, ear canal and external ear normal.     Left Ear: Tympanic membrane, ear canal and external ear normal.     Nose: Nose normal.     Mouth/Throat:     Mouth: Mucous membranes are moist.     Pharynx: Oropharynx is clear. No oropharyngeal  exudate or posterior oropharyngeal erythema.  Eyes:     Conjunctiva/sclera: Conjunctivae normal.     Pupils: Pupils are equal, round, and reactive to light.  Neck:     Thyroid: No thyromegaly.  Cardiovascular:     Rate and Rhythm: Normal rate and regular rhythm.     Heart sounds: Normal heart sounds.  Pulmonary:     Effort: Pulmonary effort is normal.     Breath sounds: Normal breath sounds. No wheezing.  Abdominal:     General: Bowel sounds are normal.     Palpations: Abdomen is soft.  Musculoskeletal:     Cervical back: Neck supple.  Lymphadenopathy:     Cervical: No cervical adenopathy.  Skin:    General: Skin is warm and dry.     Coloration: Skin is not pale.  Neurological:     Mental Status: She is alert and oriented to person, place, and time.  Psychiatric:        Behavior: Behavior normal.      No results  found for any visits on 05/09/22.     Assessment & Plan:    Routine Health Maintenance and Physical Exam  Immunization History  Administered Date(s) Administered   Influenza Whole 01/16/2010   Influenza,inj,Quad PF,6+ Mos 02/08/2014, 01/30/2016, 01/21/2019, 02/28/2020   Influenza-Unspecified 01/14/2013, 02/07/2017, 01/14/2018   Moderna Sars-Covid-2 Vaccination 11/29/2019, 01/05/2020   Td 10/10/2012   Tdap 03/01/2022    Health Maintenance  Topic Date Due   Fecal DNA (Cologuard)  Never done   COVID-19 Vaccine (3 - 2023-24 season) 05/25/2022 (Originally 12/15/2021)   INFLUENZA VACCINE  07/15/2022 (Originally 11/14/2021)   Zoster Vaccines- Shingrix (1 of 2) 08/08/2022 (Originally 07/30/2010)   MAMMOGRAM  06/01/2022   DTaP/Tdap/Td (3 - Td or Tdap) 03/01/2032   Hepatitis C Screening  Completed   HIV Screening  Completed   Pneumococcal Vaccine 77-3 Years old  Aged Out   HPV VACCINES  Aged Out    Discussed health benefits of physical activity, and encouraged her to engage in regular exercise appropriate for her age and condition.  Problem List Items Addressed This Visit   None Visit Diagnoses     Well adult exam    -  Primary   Relevant Orders   Lipid Panel w/reflex Direct LDL   COMPLETE METABOLIC PANEL WITH GFR   CBC   Vaginal atrophy       Relevant Medications   estradiol (ESTRACE) 0.1 MG/GM vaginal cream   Screening mammogram, encounter for       Relevant Orders   MM 3D SCREEN BREAST BILATERAL       Keep up a regular exercise program and make sure you are eating a healthy diet Try to eat 4 servings of dairy a day, or if you are lactose intolerant take a calcium with vitamin D daily.  Declined vaccines.    She does have the Cologuard test at home but just has not completed it.  Encouraged her to do so. Urged her to schedule yearly mammogram she did not get one done in 2023.  Return in about 6 months (around 11/07/2022) for Hypertension.     Beatrice Lecher,  MD

## 2022-06-20 ENCOUNTER — Ambulatory Visit
Admission: EM | Admit: 2022-06-20 | Discharge: 2022-06-20 | Disposition: A | Discharge status: Home / Self Care | Payer: Managed Care, Other (non HMO) | Attending: Family Medicine | Admitting: Family Medicine

## 2022-06-20 DIAGNOSIS — J22 Unspecified acute lower respiratory infection: Secondary | ICD-10-CM | POA: Diagnosis not present

## 2022-06-20 DIAGNOSIS — J0101 Acute recurrent maxillary sinusitis: Secondary | ICD-10-CM

## 2022-06-20 MED ORDER — DM-GUAIFENESIN ER 30-600 MG PO TB12: 1.0000 | ORAL_TABLET | Freq: Two times a day (BID) | ORAL | 0 refills | Status: DC

## 2022-06-20 MED ORDER — BENZONATATE 200 MG PO CAPS: 200.0000 mg | ORAL_CAPSULE | Freq: Two times a day (BID) | ORAL | 0 refills | Status: DC | PRN

## 2022-06-20 MED ORDER — AMOXICILLIN-POT CLAVULANATE 875-125 MG PO TABS: 1.0000 | ORAL_TABLET | Freq: Two times a day (BID) | ORAL | 0 refills | Status: DC

## 2022-06-20 MED ORDER — PREDNISONE 20 MG PO TABS: 40.0000 mg | ORAL_TABLET | Freq: Every day | ORAL | 0 refills | Status: DC

## 2022-06-20 NOTE — ED Provider Notes (Signed)
[name]    CSN: [id number] Arrival date & time: 06/20/22  0801      History   Chief Complaint Chief Complaint  Patient presents with   Nasal Congestion    HPI Tina Meyer is a 62 y.o. female.   HPI  Patient states that she has been sick for about 6 weeks.  She thought that it was just allergies and has been taking some over-the-counter medication.  Over the last few days she has gotten more pressure and pain in her face, headache, coughing and wheezing.  No fever or chills.  She is around children a lot.  Non-smoker.   Past Medical History:  Diagnosis Date   Depression    Hyperlipidemia    Hypertension     Patient Active Problem List   Diagnosis Date Noted   IFG (impaired fasting glucose) 12/14/2019   CKD (chronic kidney disease) stage 2, GFR 60-89 ml/min 12/14/2019   Mild aortic sclerosis 01/30/2016   Heart murmur 08/07/2011   DERMATITIS, ATOPIC 12/19/2010   Hyperlipidemia 05/27/2008   ANXIETY DEPRESSION 05/27/2008   HYPERTENSION, BENIGN 05/27/2008   INSOMNIA 05/27/2008    Past Surgical History:  Procedure Laterality Date   ABDOMINAL HYSTERECTOMY  04/16/2002   TOTAL ABDOMINAL HYSTERECTOMY  2004    OB History     Gravida  0   Para  0   Term  0   Preterm  0   AB  0   Living  0      SAB  0   IAB  0   Ectopic  0   Multiple  0   Live Births  0            Home Medications    Prior to Admission medications   Medication Sig Start Date End Date Taking? Authorizing Provider  amoxicillin-clavulanate (AUGMENTIN) 875-125 MG tablet Take 1 tablet by mouth every 12 (twelve) hours. 06/20/22  Yes Raylene Everts, MD  benzonatate (TESSALON) 200 MG capsule Take 1 capsule (200 mg total) by mouth 2 (two) times daily as needed for cough. 06/20/22  Yes Raylene Everts, MD  dextromethorphan-guaiFENesin [facility] DM) 30-600 MG 12hr tablet Take 1 tablet by mouth 2 (two) times daily. 06/20/22  Yes Raylene Everts, MD  predniSONE  (DELTASONE) 20 MG tablet Take 2 tablets (40 mg total) by mouth daily with breakfast. 06/20/22  Yes Raylene Everts, MD  albuterol (VENTOLIN HFA) 108 (90 Base) MCG/ACT inhaler Inhale 2 puffs into the lungs every 6 (six) hours as needed for wheezing. 11/30/20   Hali Marry, MD  ALPRAZolam Duanne Moron) 0.5 MG tablet TAKE 1 TABLET (0.5 MG TOTAL) BY MOUTH AT BEDTIME AS NEEDED FOR ANXIETY. TRY NOT TO TAKE EVERY NIGHT. 04/23/22   Hali Marry, MD  atorvastatin (LIPITOR) 40 MG tablet TAKE 1 TABLET BY MOUTH EVERY DAY 01/22/22   Hali Marry, MD  candesartan-hydrochlorothiazide (ATACAND HCT) 16-12.5 MG tablet TAKE 1 TABLET BY MOUTH EVERY DAY 04/20/22   Hali Marry, MD  estradiol (ESTRACE) 0.1 MG/GM vaginal cream Place 1 g vaginally 3 (three) times a week. 05/09/22   Hali Marry, MD  fluticasone (FLONASE) 50 MCG/ACT nasal spray SPRAY 2 SPRAYS INTO EACH NOSTRIL EVERY DAY 11/30/20   Hali Marry, MD  fluvoxaMINE (LUVOX) 100 MG tablet TAKE 1 TABLET BY MOUTH EVERY DAY 02/05/22   Hali Marry, MD  ipratropium (ATROVENT) 0.06 % nasal spray Place 2 sprays into both nostrils 4 (four)  times daily. 07/11/21   Hali Marry, MD  traZODone (DESYREL) 50 MG tablet Take 0.5-1 tablets (25-50 mg total) by mouth daily. 07/07/21   Hali Marry, MD  triamcinolone (KENALOG) 0.025 % ointment Apply 1 Application topically 2 (two) times daily. Twice a day for 2 weeks, the nightly for 2 weeks then as needed. 11/16/21   Radene Gunning, MD    Family History Family History  Problem Relation Age of Onset   Asthma Mother    Cerebral aneurysm Father    Breast cancer Maternal Aunt    Heart disease Maternal Aunt     Social History Social History   Tobacco Use   Smoking status: Never   Smokeless tobacco: Never  Vaping Use   Vaping Use: Never used  Substance Use Topics   Alcohol use: No    Alcohol/week: 0.0 standard drinks of alcohol   Drug use: No      Allergies   Ace inhibitors, Lisinopril, Aspirin, Losartan, and Rosuvastatin   Review of Systems Review of Systems See HPI  Physical Exam Triage Vital Signs ED Triage Vitals  Enc Vitals Group     BP 06/20/22 0812 131/77     Pulse Rate 06/20/22 0812 96     Resp 06/20/22 0812 17     Temp 06/20/22 0812 98.4 F (36.9 C)     Temp Source 06/20/22 0812 Oral     SpO2 06/20/22 0812 96 %     Weight --      Height --      Head Circumference --      Peak Flow --      Pain Score 06/20/22 0814 2     Pain Loc --      Pain Edu? --      Excl. in [place]? --    No data found.  Updated Vital Signs BP 131/77 (BP Location: Right Arm)   Pulse 96   Temp 98.4 F (36.9 C) (Oral)   Resp 17   SpO2 96%       Physical Exam Constitutional:      General: She is not in acute distress.    Appearance: She is well-developed. She is ill-appearing.  HENT:     Head: Normocephalic and atraumatic.     Right Ear: Tympanic membrane normal.     Left Ear: Tympanic membrane normal.     Nose: Congestion and rhinorrhea present.     Mouth/Throat:     Pharynx: Posterior oropharyngeal erythema present.     Comments: Facial sinuses are tender.  Posterior pharynx erythematous Eyes:     Conjunctiva/sclera: Conjunctivae normal.     Pupils: Pupils are equal, round, and reactive to light.  Cardiovascular:     Rate and Rhythm: Normal rate and regular rhythm.     Heart sounds: Normal heart sounds.  Pulmonary:     Effort: Pulmonary effort is normal. No respiratory distress.     Breath sounds: Rhonchi present.  Abdominal:     General: There is no distension.     Palpations: Abdomen is soft.  Musculoskeletal:        General: Normal range of motion.     Cervical back: Normal range of motion.  Lymphadenopathy:     Cervical: No cervical adenopathy.  Skin:    General: Skin is warm and dry.  Neurological:     Mental Status: She is alert.      UC Treatments / Results  Labs (all labs ordered are listed,  but only abnormal results are displayed) Labs Reviewed - No data to display  EKG   Radiology No results found.  Procedures Procedures (including critical care time)  Medications Ordered in UC Medications - No data to display  Initial Impression / Assessment and Plan / UC Course  I have reviewed the triage vital signs and the nursing notes.  Pertinent labs & imaging results that were available during my care of the patient were reviewed by me and considered in my medical decision making (see chart for details).    Final Clinical Impressions(s) / UC Diagnoses   Final diagnoses:  LRTI (lower respiratory tract infection)  Acute recurrent maxillary sinusitis     Discharge Instructions      Make sure you are drinking lots of water Take the augmentin 2 x a day Take prednisone once a day Take mucinex 2 x a day Continue your flonase Call for problems   ED Prescriptions     Medication Sig Dispense Auth. Provider   amoxicillin-clavulanate (AUGMENTIN) 875-125 MG tablet Take 1 tablet by mouth every 12 (twelve) hours. 20 tablet Raylene Everts, MD   predniSONE (DELTASONE) 20 MG tablet Take 2 tablets (40 mg total) by mouth daily with breakfast. 10 tablet Raylene Everts, MD   dextromethorphan-guaiFENesin [facility] DM) 30-600 MG 12hr tablet Take 1 tablet by mouth 2 (two) times daily. 20 tablet Raylene Everts, MD   benzonatate (TESSALON) 200 MG capsule Take 1 capsule (200 mg total) by mouth 2 (two) times daily as needed for cough. 20 capsule Raylene Everts, MD      PDMP not reviewed this encounter.   Raylene Everts, MD 06/20/22 [phone]

## 2022-06-20 NOTE — ED Triage Notes (Signed)
Pt c/o cough and nasal congestion x 1.5 months. Worsening in last couple days. Some facial pain and wheezing. Taking OTC nasal decongestant prn.

## 2022-06-20 NOTE — Discharge Instructions (Signed)
Make sure you are drinking lots of water Take the augmentin 2 x a day Take prednisone once a day Take mucinex 2 x a day Continue your flonase Call for problems

## 2022-06-27 ENCOUNTER — Telehealth: Payer: Self-pay | Admitting: General Practice

## 2022-06-27 NOTE — Transitions of Care (Post Inpatient/ED Visit) (Signed)
   06/27/2022  Name: INARI SHIN MRN: [id number] DOB: 1960/06/04  Today's TOC FU Call Status: Today's TOC FU Call Status:: Unsuccessul Call (1st Attempt) Unsuccessful Call (1st Attempt) Date: 06/27/22  Attempted to reach the patient regarding the most recent Inpatient/ED visit.  Follow Up Plan: Additional outreach attempts will be made to reach the patient to complete the Transitions of Care (Post Inpatient/ED visit) call.   Signature [name], RN BSN

## 2022-06-28 ENCOUNTER — Telehealth (INDEPENDENT_AMBULATORY_CARE_PROVIDER_SITE_OTHER): Payer: 59 | Admitting: Family Medicine

## 2022-06-28 ENCOUNTER — Telehealth: Payer: Self-pay | Admitting: *Deleted

## 2022-06-28 ENCOUNTER — Encounter: Payer: Self-pay | Admitting: Family Medicine

## 2022-06-28 DIAGNOSIS — R062 Wheezing: Secondary | ICD-10-CM | POA: Diagnosis not present

## 2022-06-28 DIAGNOSIS — J019 Acute sinusitis, unspecified: Secondary | ICD-10-CM | POA: Diagnosis not present

## 2022-06-28 DIAGNOSIS — B9689 Other specified bacterial agents as the cause of diseases classified elsewhere: Secondary | ICD-10-CM | POA: Diagnosis not present

## 2022-06-28 MED ORDER — ALBUTEROL SULFATE (2.5 MG/3ML) 0.083% IN NEBU: 2.5000 mg | INHALATION_SOLUTION | Freq: Four times a day (QID) | RESPIRATORY_TRACT | 1 refills | Status: DC | PRN

## 2022-06-28 MED ORDER — DOXYCYCLINE HYCLATE 100 MG PO TABS: 100.0000 mg | ORAL_TABLET | Freq: Two times a day (BID) | ORAL | 0 refills | Status: AC

## 2022-06-28 MED ORDER — FULL KIT NEBULIZER SET MISC: 0 refills | Status: DC

## 2022-06-28 NOTE — Assessment & Plan Note (Addendum)
-   stop augmentin due to side effects of untolerable diarrhea  - start doxycycline with residual facial pain in the frontal and maxillary sinuses - doxy should help the inflammation in her lungs as well  - recommended taking abx with pre/probiotics

## 2022-06-28 NOTE — Progress Notes (Signed)
Established patient visit   Patient: Tina Meyer   DOB: [birth year]   62 y.o. Female  MRN: [id number] Visit Date: 06/28/2022  Today's healthcare provider: Owens Loffler, DO   Chief Complaint  Patient presents with   Sinusitis    Went to er tue night and was put on augmentin and was given a breathing treatment and still having symptoms    SUBJECTIVE    Chief Complaint  Patient presents with   Sinusitis    Went to er tue night and was put on augmentin and was given a breathing treatment and still having symptoms   HPI HPI     Sinusitis    Additional comments: Went to er tue night and was put on augmentin and was given a breathing treatment and still having symptoms      Last edited by Tarri Glenn, [place] on 06/28/2022  1:50 PM.      I connected with  Trula Slade on 06/28/22 by a video and audio enabled telemedicine application and verified that I am speaking with the correct person using two identifiers.  Patient Location: Home  Provider Location: Home Office  I discussed the limitations of evaluation and management by telemedicine. The patient expressed understanding and agreed to proceed.  Pt presents via telehealth with concerns of congestion and wheezing. She was seen in the ED four days ago and was given a breathing treatment in addition to augmentin, an inhaler and prednisone. She has been having trouble taking the augmentin and has had some diarrhea associated with tihs. She was negative for covid and flu at the ED two days ago. Did have a CXR done in the ED and it was negative for pna. She is still experiencing wheezing.   Review of Systems  Constitutional:  Negative for activity change, fatigue and fever.  HENT:  Positive for congestion and sinus pain.   Respiratory:  Positive for cough. Negative for shortness of breath.   Cardiovascular:  Negative for chest pain.  Gastrointestinal:  Negative for abdominal pain.  Genitourinary:  Negative for  difficulty urinating.       Current Meds  Medication Sig   albuterol (PROVENTIL) (2.5 MG/3ML) 0.083% nebulizer solution Take 3 mLs (2.5 mg total) by nebulization every 6 (six) hours as needed for wheezing or shortness of breath.   albuterol (VENTOLIN HFA) 108 (90 Base) MCG/ACT inhaler Inhale 2 puffs into the lungs every 6 (six) hours as needed for wheezing.   ALPRAZolam (XANAX) 0.5 MG tablet TAKE 1 TABLET (0.5 MG TOTAL) BY MOUTH AT BEDTIME AS NEEDED FOR ANXIETY. TRY NOT TO TAKE EVERY NIGHT.   amoxicillin-clavulanate (AUGMENTIN) 875-125 MG tablet Take 1 tablet by mouth every 12 (twelve) hours.   atorvastatin (LIPITOR) 40 MG tablet TAKE 1 TABLET BY MOUTH EVERY DAY   benzonatate (TESSALON) 200 MG capsule Take 1 capsule (200 mg total) by mouth 2 (two) times daily as needed for cough.   candesartan-hydrochlorothiazide (ATACAND HCT) 16-12.5 MG tablet TAKE 1 TABLET BY MOUTH EVERY DAY   dextromethorphan-guaiFENesin (MUCINEX DM) 30-600 MG 12hr tablet Take 1 tablet by mouth 2 (two) times daily.   doxycycline (VIBRA-TABS) 100 MG tablet Take 1 tablet (100 mg total) by mouth 2 (two) times daily for 7 days.   estradiol (ESTRACE) 0.1 MG/GM vaginal cream Place 1 g vaginally 3 (three) times a week.   fluticasone (FLONASE) 50 MCG/ACT nasal spray SPRAY 2 SPRAYS INTO EACH NOSTRIL EVERY DAY   fluvoxaMINE (LUVOX) 100 MG  tablet TAKE 1 TABLET BY MOUTH EVERY DAY   ipratropium (ATROVENT) 0.06 % nasal spray Place 2 sprays into both nostrils 4 (four) times daily.   predniSONE (DELTASONE) 20 MG tablet Take 2 tablets (40 mg total) by mouth daily with breakfast.   Respiratory Therapy Supplies (FULL KIT NEBULIZER SET) MISC Nebulizer machine of patient's choice, please include tubes and hoses as needed.  Use as directed.   traZODone (DESYREL) 50 MG tablet Take 0.5-1 tablets (25-50 mg total) by mouth daily.   triamcinolone (KENALOG) 0.025 % ointment Apply 1 Application topically 2 (two) times daily. Twice a day for 2 weeks,  the nightly for 2 weeks then as needed.    OBJECTIVE      Physical Exam Vitals reviewed.  Constitutional:      Appearance: She is well-developed.  HENT:     Head: Normocephalic and atraumatic.  Eyes:     Conjunctiva/sclera: Conjunctivae normal.  Cardiovascular:     Rate and Rhythm: Normal rate.  Pulmonary:     Effort: Pulmonary effort is normal.     Comments: Audible wheezing when coughing on exam Skin:    General: Skin is dry.     Coloration: Skin is not pale.  Neurological:     Mental Status: She is alert and oriented to person, place, and time.  Psychiatric:        Behavior: Behavior normal.        ASSESSMENT & PLAN    Problem List Items Addressed This Visit       Respiratory   Acute bacterial sinusitis - Primary    - stop augmentin due to side effects of untolerable diarrhea  - start doxycycline with residual facial pain in the frontal and maxillary sinuses - doxy should help the inflammation in her lungs as well  - recommended taking abx with pre/probiotics       Relevant Medications   doxycycline (VIBRA-TABS) 100 MG tablet     Other   Wheezing    - have sent in prescription for nebulizer machine as well as treatment  - continue prednisone and start doxycycline this will also help decrease inflammation in the lungs  - if no improvement in a few days have asked pt to return to clinic for repeat cxr to investigate for pna      Relevant Medications   Respiratory Therapy Supplies (FULL KIT NEBULIZER SET) MISC   albuterol (PROVENTIL) (2.5 MG/3ML) 0.083% nebulizer solution    Return if symptoms worsen or fail to improve.      Meds ordered this encounter  Medications   doxycycline (VIBRA-TABS) 100 MG tablet    Sig: Take 1 tablet (100 mg total) by mouth 2 (two) times daily for 7 days.    Dispense:  14 tablet    Refill:  0   Respiratory Therapy Supplies (FULL KIT NEBULIZER SET) MISC    Sig: Nebulizer machine of patient's choice, please include tubes  and hoses as needed.  Use as directed.    Dispense:  1 each    Refill:  0   albuterol (PROVENTIL) (2.5 MG/3ML) 0.083% nebulizer solution    Sig: Take 3 mLs (2.5 mg total) by nebulization every 6 (six) hours as needed for wheezing or shortness of breath.    Dispense:  150 mL    Refill:  1    No orders of the defined types were placed in this encounter.    Owens Loffler, DO  [facility] (phone) [phone] (fax)  [place]

## 2022-06-28 NOTE — Telephone Encounter (Signed)
Pt called and LVM stating that she had been seen in the UC and the ED for URI. She reports that the 1st time she as treated with Augmentin which made her feel worse and caused her to seek care at the ED to which she was treated with Delysm, Prednisone, and given an inhaler.   She stated that she doesn't do well with OTC cough medication and wanted to know if Dr. Madilyn Fireman would consider writing another cough medication,ABX, inhaler and possibly a nebulizer for her.   I called the patient back to inform her that she would need to schedule an appointment to discuss this with her but her VM was full.

## 2022-06-28 NOTE — Assessment & Plan Note (Signed)
-   have sent in prescription for nebulizer machine as well as treatment  - continue prednisone and start doxycycline this will also help decrease inflammation in the lungs  - if no improvement in a few days have asked pt to return to clinic for repeat cxr to investigate for pna

## 2022-06-28 NOTE — Telephone Encounter (Signed)
Pt called back. I transferred her to the schedulers to make an appointment

## 2022-06-29 NOTE — Transitions of Care (Post Inpatient/ED Visit) (Signed)
   06/29/2022  Name: Tina Meyer MRN: [id number] DOB: [birth year]  Today's TOC FU Call Status: Today's TOC FU Call Status:: Successful TOC FU Call Competed Unsuccessful Call (1st Attempt) Date: 06/27/22 [facility] FU Call Complete Date: 06/28/22  Transition Care Management Follow-up Telephone Call Date of Discharge: 06/26/22 Discharge Facility: Other ([place]) Name of Other (Non-[facility]) Discharge Facility: [facility] Type of Discharge: Emergency Department Reason for ED Visit: Other: (cough)  Patient had OV with provider on 06/28/22.    SIGNATURE: [name] RN BSN

## 2022-07-17 ENCOUNTER — Other Ambulatory Visit: Payer: Self-pay | Admitting: Family Medicine

## 2022-07-25 ENCOUNTER — Other Ambulatory Visit: Payer: Self-pay | Admitting: Family Medicine

## 2022-07-25 DIAGNOSIS — I10 Essential (primary) hypertension: Secondary | ICD-10-CM

## 2022-08-01 ENCOUNTER — Other Ambulatory Visit: Payer: Self-pay | Admitting: Family Medicine

## 2022-08-16 ENCOUNTER — Ambulatory Visit
Admission: EM | Admit: 2022-08-16 | Discharge: 2022-08-16 | Disposition: A | Discharge status: Home / Self Care | Payer: 59 | Attending: Family Medicine | Admitting: Family Medicine

## 2022-08-16 ENCOUNTER — Encounter: Payer: Self-pay | Admitting: Emergency Medicine

## 2022-08-16 DIAGNOSIS — J020 Streptococcal pharyngitis: Secondary | ICD-10-CM

## 2022-08-16 DIAGNOSIS — J101 Influenza due to other identified influenza virus with other respiratory manifestations: Secondary | ICD-10-CM | POA: Diagnosis not present

## 2022-08-16 LAB — POCT INFLUENZA A/B
Influenza A, POC: NEGATIVE
Influenza B, POC: POSITIVE — AB

## 2022-08-16 LAB — POC SARS CORONAVIRUS 2 AG -  ED: SARS Coronavirus 2 Ag: NEGATIVE

## 2022-08-16 LAB — POCT RAPID STREP A (OFFICE): Rapid Strep A Screen: POSITIVE — AB

## 2022-08-16 MED ORDER — DEXAMETHASONE 6 MG PO TABS: 10.0000 mg | ORAL_TABLET | Freq: Once | ORAL | Status: DC

## 2022-08-16 MED ORDER — OSELTAMIVIR PHOSPHATE 75 MG PO CAPS: 75.0000 mg | ORAL_CAPSULE | Freq: Two times a day (BID) | ORAL | 0 refills | Status: DC

## 2022-08-16 MED ORDER — ONDANSETRON HCL 8 MG PO TABS: 8.0000 mg | ORAL_TABLET | Freq: Three times a day (TID) | ORAL | 0 refills | Status: AC | PRN

## 2022-08-16 MED ORDER — DEXAMETHASONE 4 MG PO TABS
8.0000 mg | ORAL_TABLET | Freq: Every day | ORAL | Status: DC
  Administered 2022-08-16: 8 mg via ORAL

## 2022-08-16 MED ORDER — ACETAMINOPHEN 500 MG PO TABS
1000.0000 mg | ORAL_TABLET | Freq: Four times a day (QID) | ORAL | Status: DC | PRN
  Administered 2022-08-16: 1000 mg via ORAL

## 2022-08-16 MED ORDER — FLUCONAZOLE 150 MG PO TABS: 150.0000 mg | ORAL_TABLET | Freq: Every day | ORAL | 0 refills | Status: DC

## 2022-08-16 MED ORDER — PENICILLIN V POTASSIUM 500 MG PO TABS: 500.0000 mg | ORAL_TABLET | Freq: Two times a day (BID) | ORAL | 0 refills | Status: AC

## 2022-08-16 NOTE — ED Triage Notes (Signed)
Patient c/o fever, bodyaches, bodychills, sore throat x 1 day.  Increased fatigue.  Patient has taken Tylenol over 6-8 hours ago.

## 2022-08-16 NOTE — ED Provider Notes (Signed)
Tina Meyer    CSN: [id number] Arrival date & time: 08/16/22  1229      History   Chief Complaint Chief Complaint  Patient presents with   Fever    HPI Tina Meyer is a 62 y.o. female.   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  She is exposed to a lot of people.  She also has 3 foster children under the age of 30.  They frequently have illness.  Currently she is here for fever, chills, body aches headaches, and sore throat since yesterday.  Very fatigued.  She is taking Tylenol for pain and fever.  Temperature on arrival is 102.1 Past Medical History:  Diagnosis Date   Depression    Hyperlipidemia    Hypertension     Patient Active Problem List   Diagnosis Date Noted   Acute bacterial sinusitis 06/28/2022   Wheezing 06/28/2022   IFG (impaired fasting glucose) 12/14/2019   CKD (chronic kidney disease) stage 2, GFR 60-89 ml/min 12/14/2019   Mild aortic sclerosis 01/30/2016   Heart murmur 08/07/2011   DERMATITIS, ATOPIC 12/19/2010   Hyperlipidemia 05/27/2008   ANXIETY DEPRESSION 05/27/2008   HYPERTENSION, BENIGN 05/27/2008   INSOMNIA 05/27/2008    Past Surgical History:  Procedure Laterality Date   ABDOMINAL HYSTERECTOMY  04/16/2002   TOTAL ABDOMINAL HYSTERECTOMY  2004    OB History     Gravida  0   Para  0   Term  0   Preterm  0   AB  0   Living  0      SAB  0   IAB  0   Ectopic  0   Multiple  0   Live Births  0            Home Medications    Prior to Admission medications   Medication Sig Start Date End Date Taking? Authorizing Provider  albuterol (PROVENTIL) (2.5 MG/3ML) 0.083% nebulizer solution Take 3 mLs (2.5 mg total) by nebulization every 6 (six) hours as needed for wheezing or shortness of breath. 06/28/22  Yes Tamera Punt, Erika S, DO  albuterol (VENTOLIN HFA) 108 (90 Base) MCG/ACT inhaler Inhale 2 puffs into the lungs every 6 (six) hours as needed for wheezing. 11/30/20  Yes Agapito Games, MD   ALPRAZolam Prudy Feeler) 0.5 MG tablet TAKE 1 TABLET (0.5 MG TOTAL) BY MOUTH AT BEDTIME AS NEEDED FOR ANXIETY. TRY NOT TO TAKE EVERY NIGHT. 04/23/22  Yes Agapito Games, MD  atorvastatin (LIPITOR) 40 MG tablet TAKE 1 TABLET BY MOUTH EVERY DAY 01/22/22  Yes Agapito Games, MD  candesartan-hydrochlorothiazide (ATACAND HCT) 16-12.5 MG tablet TAKE 1 TABLET BY MOUTH EVERY DAY 07/25/22  Yes Agapito Games, MD  estradiol (ESTRACE) 0.1 MG/GM vaginal cream Place 1 g vaginally 3 (three) times a week. 05/09/22  Yes Agapito Games, MD  fluconazole (DIFLUCAN) 150 MG tablet Take 1 tablet (150 mg total) by mouth daily. Repeat in 1 week if needed 08/16/22  Yes Eustace Moore, MD  fluticasone [facility]) 50 MCG/ACT nasal spray SPRAY 2 SPRAYS INTO EACH NOSTRIL EVERY DAY 11/30/20  Yes Agapito Games, MD  fluvoxaMINE (LUVOX) 100 MG tablet TAKE 1 TABLET BY MOUTH EVERY DAY 08/01/22  Yes Agapito Games, MD  ipratropium (ATROVENT) 0.06 % nasal spray Place 2 sprays into both nostrils 4 (four) times daily. 07/11/21  Yes Agapito Games, MD  ondansetron (ZOFRAN) 8 MG tablet Take 1 tablet (8 mg total) by mouth  every 8 (eight) hours as needed. 08/16/22  Yes Eustace Moore, MD  oseltamivir (TAMIFLU) 75 MG capsule Take 1 capsule (75 mg total) by mouth every 12 (twelve) hours. 08/16/22  Yes Eustace Moore, MD  penicillin v potassium (VEETID) 500 MG tablet Take 1 tablet (500 mg total) by mouth 2 (two) times daily for 10 days. 08/16/22 08/26/22 Yes Eustace Moore, MD  Respiratory Therapy Supplies (FULL KIT NEBULIZER SET) MISC Nebulizer machine of patient's choice, please include tubes and hoses as needed.  Use as directed. 06/28/22  Yes Charlton Amor, DO  traZODone (DESYREL) 50 MG tablet TAKE 0.5-1 TABLETS (25-50 MG TOTAL) BY MOUTH DAILY. 07/17/22  Yes Agapito Games, MD  triamcinolone (KENALOG) 0.025 % ointment Apply 1 Application topically 2 (two) times daily. Twice a day for 2 weeks, the  nightly for 2 weeks then as needed. 11/16/21  Yes Milas Hock, MD    Family History Family History  Problem Relation Age of Onset   Asthma Mother    Cerebral aneurysm Father    Breast cancer Maternal Aunt    Heart disease Maternal Aunt     Social History Social History   Tobacco Use   Smoking status: Never   Smokeless tobacco: Never  Vaping Use   Vaping Use: Never used  Substance Use Topics   Alcohol use: No    Alcohol/week: 0.0 standard drinks of alcohol   Drug use: No     Allergies   Ace inhibitors, Lisinopril, Aspirin, Augmentin [amoxicillin-pot clavulanate], Losartan, and Rosuvastatin   Review of Systems Review of Systems See HPI  Physical Exam Triage Vital Signs ED Triage Vitals  Enc Vitals Group     BP 08/16/22 1246 (!) 136/91     Pulse Rate 08/16/22 1246 (!) 126     Resp 08/16/22 1246 18     Temp 08/16/22 1246 (!) 102.1 F (38.9 C)     Temp Source 08/16/22 1246 Oral     SpO2 08/16/22 1246 95 %     Weight 08/16/22 1248 150 lb (68 kg)     Height 08/16/22 1248 5\' 2"  (1.575 m)     Head Circumference --      Peak Flow --      Pain Score 08/16/22 1248 7     Pain Loc --      Pain Edu? --      Excl. in GC? --    No data found.  Updated Vital Signs BP (!) 136/91 (BP Location: Right Arm)   Pulse (!) 126   Temp (!) 102.1 F (38.9 C) (Oral)   Resp 18   Ht 5\' 2"  (1.575 m)   Wt 68 kg   SpO2 95%   BMI 27.44 kg/m        Physical Exam Constitutional:      General: She is not in acute distress.    Appearance: She is well-developed. She is ill-appearing.  HENT:     Head: Normocephalic and atraumatic.     Mouth/Throat:     Mouth: Mucous membranes are moist.     Pharynx: Posterior oropharyngeal erythema present.  Eyes:     Conjunctiva/sclera: Conjunctivae normal.     Pupils: Pupils are equal, round, and reactive to light.  Cardiovascular:     Rate and Rhythm: Normal rate and regular rhythm.     Heart sounds: Normal heart sounds.  Pulmonary:      Effort: Pulmonary effort is normal. No respiratory distress.  Breath sounds: Normal breath sounds.  Abdominal:     General: There is no distension.     Palpations: Abdomen is soft.  Musculoskeletal:        General: Normal range of motion.     Cervical back: Normal range of motion.  Lymphadenopathy:     Cervical: Cervical adenopathy present.  Skin:    General: Skin is warm and dry.  Neurological:     Mental Status: She is alert.      UC Treatments / Results  Labs (all labs ordered are listed, but only abnormal results are displayed) Labs Reviewed  POCT RAPID STREP A (OFFICE) - Abnormal; Notable for the following components:      Result Value   Rapid Strep A Screen Positive (*)    All other components within normal limits  POCT INFLUENZA A/B - Abnormal; Notable for the following components:   Influenza B, POC Positive (*)    All other components within normal limits  POC SARS CORONAVIRUS 2 AG -  ED    EKG   Radiology No results found.  Procedures Procedures (including critical Meyer time)  Medications Ordered in UC Medications  acetaminophen (TYLENOL) tablet 1,000 mg (1,000 mg Oral Given 08/16/22 1331)  dexamethasone (DECADRON) tablet 8 mg (8 mg Oral Given 08/16/22 1343)    Initial Impression / Assessment and Plan / UC Course  I have reviewed the triage vital signs and the nursing notes.  Pertinent labs & imaging results that were available during my Meyer of the patient were reviewed by me and considered in my medical decision making (see chart for details).     Final Clinical Impressions(s) / UC Diagnoses   Final diagnoses:  Streptococcal sore throat  Influenza B     Discharge Instructions      Take penicillin 2 times a day for 10 full days Take the tamiflu 2 x a day for 5 days Drink lots of water Take Tylenol or ibuprofen for pain and fever May use sore throat spray or lozenge for pain I have prescribed Zofran if needed for nausea I have  prescribed Diflucan in case needed I have written you a note to be off until Monday.  Call if unable to return at that time    ED Prescriptions     Medication Sig Dispense Auth. Provider   penicillin v potassium (VEETID) 500 MG tablet Take 1 tablet (500 mg total) by mouth 2 (two) times daily for 10 days. 20 tablet Eustace Moore, MD   oseltamivir (TAMIFLU) 75 MG capsule Take 1 capsule (75 mg total) by mouth every 12 (twelve) hours. 10 capsule Eustace Moore, MD   fluconazole (DIFLUCAN) 150 MG tablet Take 1 tablet (150 mg total) by mouth daily. Repeat in 1 week if needed 2 tablet Eustace Moore, MD   ondansetron (ZOFRAN) 8 MG tablet Take 1 tablet (8 mg total) by mouth every 8 (eight) hours as needed. 20 tablet Eustace Moore, MD      PDMP not reviewed this encounter.   Eustace Moore, MD 08/16/22 [id number]

## 2022-08-16 NOTE — Discharge Instructions (Addendum)
Take penicillin 2 times a day for 10 full days Take the tamiflu 2 x a day for 5 days Drink lots of water Take Tylenol or ibuprofen for pain and fever May use sore throat spray or lozenge for pain I have prescribed Zofran if needed for nausea I have prescribed Diflucan in case needed I have written you a note to be off until Monday.  Call if unable to return at that time

## 2022-08-17 ENCOUNTER — Telehealth: Payer: Self-pay | Admitting: Family Medicine

## 2022-08-17 ENCOUNTER — Telehealth: Payer: Self-pay

## 2022-08-17 MED ORDER — PREDNISONE 20 MG PO TABS: ORAL_TABLET | ORAL | 0 refills | Status: DC

## 2022-08-17 NOTE — Telephone Encounter (Signed)
Prednisone 60 mg daily x 5 days sent to patient's pharmacy for sore throat pain secondary to strep pharyngitis diagnosis yesterday, Thursday, 08/16/2022.

## 2022-08-17 NOTE — Telephone Encounter (Signed)
Patient called clinic regarding yesterdays UC visit. Patient states she tested positive for flu and strep, she wants to know if a steroid can be sent for her. Pt reports throat pain is worse today. Consulted with provider. Ave Filter, NP sent her prednisone. Patient notified. Voiced understanding.

## 2022-10-25 ENCOUNTER — Other Ambulatory Visit: Payer: Self-pay | Admitting: Family Medicine

## 2022-10-25 DIAGNOSIS — E7849 Other hyperlipidemia: Secondary | ICD-10-CM

## 2022-10-25 DIAGNOSIS — I10 Essential (primary) hypertension: Secondary | ICD-10-CM

## 2022-10-25 DIAGNOSIS — F341 Dysthymic disorder: Secondary | ICD-10-CM

## 2022-10-26 ENCOUNTER — Ambulatory Visit: Payer: BLUE CROSS/BLUE SHIELD | Admitting: Family Medicine

## 2022-10-26 NOTE — Telephone Encounter (Signed)
Last OV: 05/09/22 Next OV: 11/07/22 Last RF: 04/23/22

## 2022-11-07 ENCOUNTER — Ambulatory Visit: Payer: 59 | Admitting: Family Medicine

## 2022-11-14 ENCOUNTER — Ambulatory Visit
Admission: EM | Admit: 2022-11-14 | Discharge: 2022-11-14 | Disposition: A | Discharge status: Home / Self Care | Payer: 59 | Attending: Family Medicine | Admitting: Family Medicine

## 2022-11-14 ENCOUNTER — Encounter: Payer: Self-pay | Admitting: Emergency Medicine

## 2022-11-14 DIAGNOSIS — J3489 Other specified disorders of nose and nasal sinuses: Secondary | ICD-10-CM | POA: Diagnosis not present

## 2022-11-14 DIAGNOSIS — R059 Cough, unspecified: Secondary | ICD-10-CM | POA: Diagnosis not present

## 2022-11-14 DIAGNOSIS — J0100 Acute maxillary sinusitis, unspecified: Secondary | ICD-10-CM | POA: Diagnosis not present

## 2022-11-14 MED ORDER — BENZONATATE 200 MG PO CAPS: 200.0000 mg | ORAL_CAPSULE | Freq: Three times a day (TID) | ORAL | 0 refills | Status: AC | PRN

## 2022-11-14 MED ORDER — PREDNISONE 20 MG PO TABS: ORAL_TABLET | ORAL | 0 refills | Status: DC

## 2022-11-14 MED ORDER — FLUCONAZOLE 200 MG PO TABS: ORAL_TABLET | ORAL | 0 refills | Status: DC

## 2022-11-14 MED ORDER — DOXYCYCLINE HYCLATE 100 MG PO CAPS: 100.0000 mg | ORAL_CAPSULE | Freq: Two times a day (BID) | ORAL | 0 refills | Status: AC

## 2022-11-14 NOTE — ED Provider Notes (Signed)
[facility]    CSN: [id number] Arrival date & time: 11/14/22  1723      History   Chief Complaint Chief Complaint  Patient presents with   Cough    HPI [name] is a 62 y.o. female.   HPI 62 year old female presents with cough, sore throat, right ear pain, body aches, and fatigue for 6 days. PMH significant for HTN, acute bacterial sinusitis, and mild aortic sclerosis  Past Medical History:  Diagnosis Date   Depression    Hyperlipidemia    Hypertension     Patient Active Problem List   Diagnosis Date Noted   Acute bacterial sinusitis 06/28/2022   Wheezing 06/28/2022   IFG (impaired fasting glucose) 12/14/2019   CKD (chronic kidney disease) stage 2, GFR 60-89 ml/min 12/14/2019   Mild aortic sclerosis 01/30/2016   Heart murmur 08/07/2011   DERMATITIS, ATOPIC 12/19/2010   Hyperlipidemia 05/27/2008   ANXIETY DEPRESSION 05/27/2008   HYPERTENSION, BENIGN 05/27/2008   INSOMNIA 05/27/2008    Past Surgical History:  Procedure Laterality Date   ABDOMINAL HYSTERECTOMY  04/16/2002   TOTAL ABDOMINAL HYSTERECTOMY  2004    OB History     Gravida  0   Para  0   Term  0   Preterm  0   AB  0   Living  0      SAB  0   IAB  0   Ectopic  0   Multiple  0   Live Births  0            Home Medications    Prior to Admission medications   Medication Sig Start Date End Date Taking? Authorizing Provider  albuterol (PROVENTIL) (2.5 MG/3ML) 0.083% nebulizer solution Take 3 mLs (2.5 mg total) by nebulization every 6 (six) hours as needed for wheezing or shortness of breath. 06/28/22  Yes Tamera Punt, Erika S, DO  albuterol (VENTOLIN HFA) 108 (90 Base) MCG/ACT inhaler Inhale 2 puffs into the lungs every 6 (six) hours as needed for wheezing. 11/30/20  Yes Agapito Games, MD  ALPRAZolam Prudy Feeler) 0.5 MG tablet TAKE 1 TABLET (0.5 MG TOTAL) BY MOUTH AT BEDTIME AS NEEDED FOR ANXIETY. TRY NOT TO TAKE EVERY NIGHT. 10/29/22  Yes Agapito Games,  MD  atorvastatin (LIPITOR) 40 MG tablet TAKE 1 TABLET BY MOUTH EVERY DAY 10/29/22  Yes Agapito Games, MD  benzonatate (TESSALON) 200 MG capsule Take 1 capsule (200 mg total) by mouth 3 (three) times daily as needed for up to 7 days. 11/14/22 11/21/22 Yes Trevor Iha, FNP  candesartan-hydrochlorothiazide (ATACAND HCT) 16-12.5 MG tablet TAKE 1 TABLET BY MOUTH EVERY DAY 10/29/22  Yes Agapito Games, MD  doxycycline (VIBRAMYCIN) 100 MG capsule Take 1 capsule (100 mg total) by mouth 2 (two) times daily for 10 days. 11/14/22 11/24/22 Yes Trevor Iha, FNP  estradiol (ESTRACE) 0.1 MG/GM vaginal cream Place 1 g vaginally 3 (three) times a week. 05/09/22  Yes Agapito Games, MD  fluconazole (DIFLUCAN) 200 MG tablet Take 1 tab p.o. now, may repeat 1 tab p.o. in 3 days if symptoms are not resolved. 11/14/22  Yes Trevor Iha, FNP  fluticasone (FLONASE) 50 MCG/ACT nasal spray SPRAY 2 SPRAYS INTO EACH NOSTRIL EVERY DAY 11/30/20  Yes Agapito Games, MD  fluvoxaMINE (LUVOX) 100 MG tablet TAKE 1 TABLET BY MOUTH EVERY DAY 08/01/22  Yes Agapito Games, MD  ipratropium (ATROVENT) 0.06 % nasal spray Place 2 sprays into both nostrils 4 (four) times  daily. 07/11/21  Yes Agapito Games, MD  ondansetron (ZOFRAN) 8 MG tablet Take 1 tablet (8 mg total) by mouth every 8 (eight) hours as needed. 08/16/22  Yes Eustace Moore, MD  oseltamivir (TAMIFLU) 75 MG capsule Take 1 capsule (75 mg total) by mouth every 12 (twelve) hours. 08/16/22  Yes Eustace Moore, MD  predniSONE (DELTASONE) 20 MG tablet Take 3 tabs PO daily x 5 days. 11/14/22  Yes Trevor Iha, FNP  Respiratory Therapy Supplies (FULL KIT NEBULIZER SET) MISC Nebulizer machine of patient's choice, please include tubes and hoses as needed.  Use as directed. 06/28/22  Yes Charlton Amor, DO  traZODone (DESYREL) 50 MG tablet TAKE 0.5-1 TABLETS (25-50 MG TOTAL) BY MOUTH DAILY. 07/17/22  Yes Agapito Games, MD  triamcinolone  (KENALOG) 0.025 % ointment Apply 1 Application topically 2 (two) times daily. Twice a day for 2 weeks, the nightly for 2 weeks then as needed. 11/16/21  Yes Milas Hock, MD    Family History Family History  Problem Relation Age of Onset   Asthma Mother    Cerebral aneurysm Father    Breast cancer Maternal Aunt    Heart disease Maternal Aunt     Social History Social History   Tobacco Use   Smoking status: Never   Smokeless tobacco: Never  Vaping Use   Vaping status: Never Used  Substance Use Topics   Alcohol use: No    Alcohol/week: 0.0 standard drinks of alcohol   Drug use: No     Allergies   Ace inhibitors, Lisinopril, Aspirin, Augmentin [amoxicillin-pot clavulanate], Losartan, and Rosuvastatin   Review of Systems Review of Systems  HENT:  Positive for sore throat.   Respiratory:  Positive for cough.   All other systems reviewed and are negative.    Physical Exam Triage Vital Signs ED Triage Vitals  Encounter Vitals Group     BP      Systolic BP Percentile      Diastolic BP Percentile      Pulse      Resp      Temp      Temp src      SpO2      Weight      Height      Head Circumference      Peak Flow      Pain Score      Pain Loc      Pain Education      Exclude from Growth Chart    No data found.  Updated Vital Signs BP 130/85 (BP Location: Right Arm)   Pulse 85   Temp 98.2 F (36.8 C) (Oral)   Resp 18   Ht 5\' 2"  (1.575 m)   Wt 149 lb (67.6 kg)   SpO2 93%   BMI 27.25 kg/m    Physical Exam Vitals and nursing note reviewed.  Constitutional:      Appearance: Normal appearance. She is obese. She is ill-appearing.  HENT:     Head: Normocephalic and atraumatic.     Right Ear: Tympanic membrane and external ear normal.     Left Ear: Tympanic membrane and external ear normal.     Ears:     Comments: Moderate eustachian tube dysfunction noted bilaterally    Nose: Nose normal.     Mouth/Throat:     Mouth: Mucous membranes are moist.      Pharynx: Oropharynx is clear.  Eyes:     Extraocular Movements: Extraocular  movements intact.     Conjunctiva/sclera: Conjunctivae normal.     Pupils: Pupils are equal, round, and reactive to light.  Cardiovascular:     Rate and Rhythm: Normal rate and regular rhythm.     Pulses: Normal pulses.     Heart sounds: Normal heart sounds.  Pulmonary:     Effort: Pulmonary effort is normal.     Breath sounds: Normal breath sounds. No wheezing, rhonchi or rales.     Comments: Infrequent nonproductive cough noted on exam Musculoskeletal:        General: Normal range of motion.     Cervical back: Normal range of motion and neck supple.  Skin:    General: Skin is warm and dry.  Neurological:     General: No focal deficit present.     Mental Status: She is alert and oriented to person, place, and time.  Psychiatric:        Mood and Affect: Mood normal.        Behavior: Behavior normal.        Thought Content: Thought content normal.      UC Treatments / Results  Labs (all labs ordered are listed, but only abnormal results are displayed) Labs Reviewed - No data to display  EKG   Radiology No results found.  Procedures Procedures (including critical care time)  Medications Ordered in UC Medications - No data to display  Initial Impression / Assessment and Plan / UC Course  I have reviewed the triage vital signs and the nursing notes.  Pertinent labs & imaging results that were available during my care of the patient were reviewed by me and considered in my medical decision making (see chart for details).     MDM: 1.  Acute maxillary sinusitis, recurrence not specified-Rx'd doxycycline 100 mg capsule twice daily x 10 days; 2.  Sinus pressure-or prednisone 60 mg daily x 5 days; 3.  Cough, unspecified type-Rx Tessalon Perles 200 mg 3 times daily, as needed.  Empiric Diflucan 200 mg tablet: Take 1 tablet now roommate may repeat 1 tablet in 3 days if symptoms or not completely  resolved,prescribed per patient request for vaginal candidiasis when taking antibiotics. Advised patient to take medications as directed with food to completion.  Advised patient to take Prednisone with first dose of Amoxicillin for the next 10 days.  Advised may use Tessalon Perles daily or as needed for cough.  Advised patient may use Diflucan for vaginal candidiasis if necessary.  Encouraged increased daily water intake to 64 ounces per day while taking these medications.  Advised if symptoms worsen and/or unresolved please follow-up with PCP or here for further evaluation.  Patient discharged home, hemodynamically stable. Final Clinical Impressions(s) / UC Diagnoses   Final diagnoses:  Acute maxillary sinusitis, recurrence not specified  Cough, unspecified type  Sinus pressure     Discharge Instructions      Advised patient to take medications as directed with food to completion.  Advised patient to take Prednisone with first dose of Amoxicillin for the next 10 days.  Advised may use Tessalon Perles daily or as needed for cough.  Advised patient may use Diflucan for vaginal candidiasis if necessary.  Encouraged increased daily water intake to 64 ounces per day while taking these medications.  Advised if symptoms worsen and/or unresolved please follow-up with PCP or here for further evaluation.     ED Prescriptions     Medication Sig Dispense Auth. Provider   benzonatate (TESSALON) 200 MG  capsule Take 1 capsule (200 mg total) by mouth 3 (three) times daily as needed for up to 7 days. 40 capsule Trevor Iha, FNP   doxycycline (VIBRAMYCIN) 100 MG capsule Take 1 capsule (100 mg total) by mouth 2 (two) times daily for 10 days. 20 capsule Trevor Iha, FNP   fluconazole (DIFLUCAN) 200 MG tablet Take 1 tab p.o. now, may repeat 1 tab p.o. in 3 days if symptoms are not resolved. 7 tablet Trevor Iha, FNP   predniSONE (DELTASONE) 20 MG tablet Take 3 tabs PO daily x 5 days. 15 tablet Trevor Iha, FNP      PDMP not reviewed this encounter.   Trevor Iha, FNP 11/14/22 [phone]

## 2022-11-14 NOTE — Discharge Instructions (Addendum)
Advised patient to take medications as directed with food to completion.  Advised patient to take Prednisone with first dose of Amoxicillin for the next 10 days.  Advised may use Tessalon Perles daily or as needed for cough.  Advised patient may use Diflucan for vaginal candidiasis if necessary.  Encouraged increased daily water intake to 64 ounces per day while taking these medications.  Advised if symptoms worsen and/or unresolved please follow-up with PCP or here for further evaluation.

## 2022-11-14 NOTE — ED Triage Notes (Signed)
Patient c/o body aches, right sided sore throat, right ear pain, fatigue and congestion x 6 days.  Patient has taken Tylenol, Advil and OTC Nasal Decongestant.

## 2022-12-12 ENCOUNTER — Encounter: Payer: Self-pay | Admitting: Family Medicine

## 2022-12-12 ENCOUNTER — Ambulatory Visit (INDEPENDENT_AMBULATORY_CARE_PROVIDER_SITE_OTHER): Payer: 59 | Admitting: Family Medicine

## 2022-12-12 ENCOUNTER — Ambulatory Visit (INDEPENDENT_AMBULATORY_CARE_PROVIDER_SITE_OTHER): Payer: 59

## 2022-12-12 VITALS — BP 132/71 | HR 75 | Ht 62.0 in | Wt 151.0 lb

## 2022-12-12 DIAGNOSIS — Z1231 Encounter for screening mammogram for malignant neoplasm of breast: Secondary | ICD-10-CM | POA: Diagnosis not present

## 2022-12-12 DIAGNOSIS — R7301 Impaired fasting glucose: Secondary | ICD-10-CM

## 2022-12-12 DIAGNOSIS — N182 Chronic kidney disease, stage 2 (mild): Secondary | ICD-10-CM

## 2022-12-12 DIAGNOSIS — R062 Wheezing: Secondary | ICD-10-CM | POA: Diagnosis not present

## 2022-12-12 DIAGNOSIS — I10 Essential (primary) hypertension: Secondary | ICD-10-CM

## 2022-12-12 DIAGNOSIS — E7849 Other hyperlipidemia: Secondary | ICD-10-CM

## 2022-12-12 DIAGNOSIS — Z1211 Encounter for screening for malignant neoplasm of colon: Secondary | ICD-10-CM

## 2022-12-12 MED ORDER — AZELASTINE HCL 0.1 % NA SOLN: 1.0000 | Freq: Two times a day (BID) | NASAL | 12 refills | Status: DC

## 2022-12-12 MED ORDER — ALBUTEROL SULFATE HFA 108 (90 BASE) MCG/ACT IN AERS
2.0000 | INHALATION_SPRAY | Freq: Four times a day (QID) | RESPIRATORY_TRACT | 2 refills | Status: DC | PRN
Start: 2022-12-12 — End: 2023-05-15

## 2022-12-12 NOTE — Assessment & Plan Note (Signed)
Due to check renal function.

## 2022-12-12 NOTE — Assessment & Plan Note (Signed)
Check A1C today

## 2022-12-12 NOTE — Assessment & Plan Note (Signed)
Due to check lipids.  

## 2022-12-12 NOTE — Assessment & Plan Note (Signed)
At goal. F/u in 6 months. Due for labs.

## 2022-12-12 NOTE — Progress Notes (Signed)
   Established Patient Office Visit  Subjective   Patient ID: Tina Meyer, female    DOB: 1960/06/18  Age: 62 y.o. MRN: [id number]  Chief Complaint  Patient presents with   Hypertension    HPI  Hypertension- Pt denies chest pain, SOB, dizziness, or heart palpitations.  Taking meds as directed w/o problems.  Denies medication side effects. Tracking Bp at work . Usually under 130. Lat BP at work was 114/72.    Impaired fasting glucose-no increased thirst or urination. No symptoms consistent with hypoglycemia.       ROS    Objective:     BP 132/71   Pulse 75   Ht 5\' 2"  (1.575 m)   Wt 151 lb (68.5 kg)   SpO2 97%   BMI 27.62 kg/m    Physical Exam Vitals and nursing note reviewed.  Constitutional:      Appearance: Normal appearance.  HENT:     Head: Normocephalic and atraumatic.  Eyes:     Conjunctiva/sclera: Conjunctivae normal.  Cardiovascular:     Rate and Rhythm: Normal rate and regular rhythm.     Heart sounds: Murmur heard.     Comments: 2/6 SEM Pulmonary:     Effort: Pulmonary effort is normal.     Breath sounds: Normal breath sounds.  Skin:    General: Skin is warm and dry.  Neurological:     Mental Status: She is alert.  Psychiatric:        Mood and Affect: Mood normal.      No results found for any visits on 12/12/22.    The 10-year ASCVD risk score (Arnett DK, et al., 2019) is: 5.8%    Assessment & Plan:   Problem List Items Addressed This Visit       Cardiovascular and Mediastinum   HYPERTENSION, BENIGN - Primary    At goal. F/u in 6 months. Due for labs.       Relevant Orders   CMP14+EGFR   Lipid Panel With LDL/HDL Ratio   HgB A1c     Endocrine   IFG (impaired fasting glucose)    Check A1C today.       Relevant Orders   CMP14+EGFR   Lipid Panel With LDL/HDL Ratio   HgB A1c     Genitourinary   CKD (chronic kidney disease) stage 2, GFR 60-89 ml/min    Due to check renal function.       Relevant Orders    CMP14+EGFR   Lipid Panel With LDL/HDL Ratio   HgB A1c     Other   Wheezing   Relevant Medications   albuterol (VENTOLIN HFA) 108 (90 Base) MCG/ACT inhaler   Hyperlipidemia    Due to check lipids.       Other Visit Diagnoses     Screening mammogram for breast cancer       Relevant Orders   MM 3D SCREENING MAMMOGRAM BILATERAL BREAST   Screen for colon cancer       Relevant Orders   Ambulatory referral to Gastroenterology       Return in about 6 months (around 06/14/2023) for Hypertension.    Nani Gasser, MD

## 2022-12-13 LAB — CMP14+EGFR
ALT: 27 IU/L (ref 0–32)
AST: 26 IU/L (ref 0–40)
Albumin: 4.6 g/dL (ref 3.9–4.9)
Alkaline Phosphatase: 85 IU/L (ref 44–121)
BUN/Creatinine Ratio: 16 (ref 12–28)
BUN: 18 mg/dL (ref 8–27)
Bilirubin Total: 0.4 mg/dL (ref 0.0–1.2)
CO2: 25 mmol/L (ref 20–29)
Calcium: 9.6 mg/dL (ref 8.7–10.3)
Chloride: 103 mmol/L (ref 96–106)
Creatinine, Ser: 1.13 mg/dL — ABNORMAL HIGH (ref 0.57–1.00)
Globulin, Total: 2.3 g/dL (ref 1.5–4.5)
Glucose: 92 mg/dL (ref 70–99)
Potassium: 4.3 mmol/L (ref 3.5–5.2)
Sodium: 144 mmol/L (ref 134–144)
Total Protein: 6.9 g/dL (ref 6.0–8.5)
eGFR: 55 mL/min/{1.73_m2} — ABNORMAL LOW (ref >59)

## 2022-12-13 LAB — HEMOGLOBIN A1C
Est. average glucose Bld gHb Est-mCnc: 120 mg/dL
Hgb A1c MFr Bld: 5.8 % — ABNORMAL HIGH (ref 4.8–5.6)

## 2022-12-13 LAB — LIPID PANEL WITH LDL/HDL RATIO
Cholesterol, Total: 221 mg/dL — ABNORMAL HIGH (ref 100–199)
HDL: 51 mg/dL (ref >39)
LDL Chol Calc (NIH): 130 mg/dL — ABNORMAL HIGH (ref 0–99)
LDL/HDL Ratio: 2.5 ratio (ref 0.0–3.2)
Triglycerides: 227 mg/dL — ABNORMAL HIGH (ref 0–149)
VLDL Cholesterol Cal: 40 mg/dL (ref 5–40)

## 2022-12-14 NOTE — Progress Notes (Signed)
Hi Tina Meyer, kidney function is up just slightly at 1.1 your baseline is usually 0.9-1.0.  I do and keep an eye on this and plan to recheck again in 6 months instead of going out further.  The rest of the metabolic panel looks great.  Your triglycerides are up compared to last year and your LDL is up to 130.  Are you still taking your Lipitor?  If not please restart if you need refills let us know.  If you already are taking it regularly then I may need to look at adjusting your dose so please let us know.  A1c is 5.8 in the prediabetes range just continue to work on healthy diet and regular exercise.

## 2022-12-14 NOTE — Progress Notes (Signed)
Please call patient. Normal mammogram.  Repeat in 1 year.  

## 2023-01-20 ENCOUNTER — Other Ambulatory Visit: Payer: Self-pay

## 2023-01-20 ENCOUNTER — Ambulatory Visit
Admission: EM | Admit: 2023-01-20 | Discharge: 2023-01-20 | Disposition: A | Discharge status: Home / Self Care | Payer: Managed Care, Other (non HMO) | Attending: Internal Medicine | Admitting: Internal Medicine

## 2023-01-20 ENCOUNTER — Other Ambulatory Visit: Payer: Self-pay | Admitting: Family Medicine

## 2023-01-20 DIAGNOSIS — I10 Essential (primary) hypertension: Secondary | ICD-10-CM

## 2023-01-20 DIAGNOSIS — R350 Frequency of micturition: Secondary | ICD-10-CM | POA: Diagnosis not present

## 2023-01-20 DIAGNOSIS — N182 Chronic kidney disease, stage 2 (mild): Secondary | ICD-10-CM | POA: Diagnosis not present

## 2023-01-20 DIAGNOSIS — N3001 Acute cystitis with hematuria: Secondary | ICD-10-CM | POA: Insufficient documentation

## 2023-01-20 DIAGNOSIS — I129 Hypertensive chronic kidney disease with stage 1 through stage 4 chronic kidney disease, or unspecified chronic kidney disease: Secondary | ICD-10-CM | POA: Diagnosis not present

## 2023-01-20 DIAGNOSIS — M545 Low back pain, unspecified: Secondary | ICD-10-CM | POA: Diagnosis present

## 2023-01-20 DIAGNOSIS — E785 Hyperlipidemia, unspecified: Secondary | ICD-10-CM | POA: Insufficient documentation

## 2023-01-20 DIAGNOSIS — F341 Dysthymic disorder: Secondary | ICD-10-CM

## 2023-01-20 DIAGNOSIS — R300 Dysuria: Secondary | ICD-10-CM | POA: Diagnosis present

## 2023-01-20 LAB — POCT URINALYSIS DIP (MANUAL ENTRY)
Bilirubin, UA: NEGATIVE
Glucose, UA: NEGATIVE mg/dL
Ketones, POC UA: NEGATIVE mg/dL
Nitrite, UA: NEGATIVE
Protein Ur, POC: 30 mg/dL — AB
Spec Grav, UA: 1.02 (ref 1.010–1.025)
Urobilinogen, UA: 0.2 U/dL
pH, UA: 7 (ref 5.0–8.0)

## 2023-01-20 MED ORDER — SULFAMETHOXAZOLE-TRIMETHOPRIM 800-160 MG PO TABS: 1.0000 | ORAL_TABLET | Freq: Two times a day (BID) | ORAL | 0 refills | Status: AC

## 2023-01-20 MED ORDER — FLUCONAZOLE 150 MG PO TABS: ORAL_TABLET | ORAL | 0 refills | Status: DC

## 2023-01-20 NOTE — ED Notes (Signed)
Rounding on patient. No needs at this time.

## 2023-01-20 NOTE — ED Notes (Signed)
Patient given cup of water, awaiting urine sample.

## 2023-01-20 NOTE — ED Provider Notes (Signed)
[facility]    CSN: [id number] Arrival date & time: 01/20/23  4098      History   Chief Complaint Chief Complaint  Patient presents with   Back Pain   Urinary Frequency   Abdominal Pain    HPI Tina Meyer is a 62 y.o. female.   HPI Pleasant 62 year old female presents with lower back pain and discomfort with urination for 1 week.  PMH significant for CKD (stage II), HTN, and HLD.  Past Medical History:  Diagnosis Date   Depression    Hyperlipidemia    Hypertension     Patient Active Problem List   Diagnosis Date Noted   Wheezing 06/28/2022   IFG (impaired fasting glucose) 12/14/2019   CKD (chronic kidney disease) stage 2, GFR 60-89 ml/min 12/14/2019   Mild aortic sclerosis 01/30/2016   Heart murmur 08/07/2011   DERMATITIS, ATOPIC 12/19/2010   Hyperlipidemia 05/27/2008   ANXIETY DEPRESSION 05/27/2008   HYPERTENSION, BENIGN 05/27/2008   INSOMNIA 05/27/2008    Past Surgical History:  Procedure Laterality Date   ABDOMINAL HYSTERECTOMY  04/16/2002   TOTAL ABDOMINAL HYSTERECTOMY  2004    OB History     Gravida  0   Para  0   Term  0   Preterm  0   AB  0   Living  0      SAB  0   IAB  0   Ectopic  0   Multiple  0   Live Births  0            Home Medications    Prior to Admission medications   Medication Sig Start Date End Date Taking? Authorizing Provider  fluconazole (DIFLUCAN) 150 MG tablet Take 1 tab p.o. for vaginal candidiasis, may repeat 1 tab p.o. in 3 days if symptoms are not resolved. 01/20/23  Yes Trevor Iha, FNP  sulfamethoxazole-trimethoprim (BACTRIM DS) 800-160 MG tablet Take 1 tablet by mouth 2 (two) times daily for 7 days. 01/20/23 01/27/23 Yes Trevor Iha, FNP  albuterol (PROVENTIL) (2.5 MG/3ML) 0.083% nebulizer solution Take 3 mLs (2.5 mg total) by nebulization every 6 (six) hours as needed for wheezing or shortness of breath. 06/28/22   Charlton Amor, DO  albuterol (VENTOLIN HFA) 108 (90 Base)  MCG/ACT inhaler Inhale 2 puffs into the lungs every 6 (six) hours as needed for wheezing. 12/12/22   Agapito Games, MD  ALPRAZolam Prudy Feeler) 0.5 MG tablet TAKE 1 TABLET (0.5 MG TOTAL) BY MOUTH AT BEDTIME AS NEEDED FOR ANXIETY. TRY NOT TO TAKE EVERY NIGHT. 10/29/22   Agapito Games, MD  atorvastatin (LIPITOR) 40 MG tablet TAKE 1 TABLET BY MOUTH EVERY DAY 10/29/22   Agapito Games, MD  azelastine (ASTELIN) 0.1 % nasal spray Place 1-2 sprays into both nostrils 2 (two) times daily. Use in each nostril as directed 12/12/22   Agapito Games, MD  candesartan-hydrochlorothiazide (ATACAND HCT) 16-12.5 MG tablet TAKE 1 TABLET BY MOUTH EVERY DAY 10/29/22   Agapito Games, MD  estradiol (ESTRACE) 0.1 MG/GM vaginal cream Place 1 g vaginally 3 (three) times a week. 05/09/22   Agapito Games, MD  fluticasone (FLONASE) 50 MCG/ACT nasal spray SPRAY 2 SPRAYS INTO EACH NOSTRIL EVERY DAY 11/30/20   Agapito Games, MD  fluvoxaMINE (LUVOX) 100 MG tablet TAKE 1 TABLET BY MOUTH EVERY DAY 08/01/22   Agapito Games, MD  ondansetron (ZOFRAN) 8 MG tablet Take 1 tablet (8 mg total) by mouth every 8 (eight)  hours as needed. 08/16/22   Eustace Moore, MD  traZODone (DESYREL) 50 MG tablet TAKE 0.5-1 TABLETS (25-50 MG TOTAL) BY MOUTH DAILY. 07/17/22   Agapito Games, MD    Family History Family History  Problem Relation Age of Onset   Asthma Mother    Cerebral aneurysm Father    Breast cancer Maternal Aunt    Heart disease Maternal Aunt     Social History Social History   Tobacco Use   Smoking status: Never   Smokeless tobacco: Never  Vaping Use   Vaping status: Never Used  Substance Use Topics   Alcohol use: No    Alcohol/week: 0.0 standard drinks of alcohol   Drug use: No     Allergies   Ace inhibitors, Lisinopril, Aspirin, Augmentin [amoxicillin-pot clavulanate], Losartan, and Rosuvastatin   Review of Systems Review of Systems  Genitourinary:   Positive for frequency.  Musculoskeletal:  Positive for back pain.  All other systems reviewed and are negative.    Physical Exam Triage Vital Signs ED Triage Vitals  Encounter Vitals Group     BP      Systolic BP Percentile      Diastolic BP Percentile      Pulse      Resp      Temp      Temp src      SpO2      Weight      Height      Head Circumference      Peak Flow      Pain Score      Pain Loc      Pain Education      Exclude from Growth Chart    No data found.  Updated Vital Signs BP 110/72 (BP Location: Left Arm)   Pulse 79   Temp (!) 97.4 F (36.3 C) (Oral)   Resp 16   SpO2 98%    Physical Exam Vitals and nursing note reviewed.  Constitutional:      Appearance: Normal appearance. She is normal weight. She is not ill-appearing.  HENT:     Head: Normocephalic and atraumatic.     Mouth/Throat:     Mouth: Mucous membranes are moist.     Pharynx: Oropharynx is clear.  Eyes:     Extraocular Movements: Extraocular movements intact.     Conjunctiva/sclera: Conjunctivae normal.     Pupils: Pupils are equal, round, and reactive to light.  Cardiovascular:     Rate and Rhythm: Normal rate and regular rhythm.     Pulses: Normal pulses.     Heart sounds: Normal heart sounds. No murmur heard. Pulmonary:     Effort: Pulmonary effort is normal.     Breath sounds: Normal breath sounds. No wheezing, rhonchi or rales.  Abdominal:     Tenderness: There is no right CVA tenderness or left CVA tenderness.  Musculoskeletal:     Cervical back: Normal range of motion and neck supple.  Skin:    General: Skin is warm and dry.  Neurological:     General: No focal deficit present.     Mental Status: She is alert and oriented to person, place, and time. Mental status is at baseline.      UC Treatments / Results  Labs (all labs ordered are listed, but only abnormal results are displayed) Labs Reviewed  POCT URINALYSIS DIP (MANUAL ENTRY) - Abnormal; Notable for the  following components:      Result Value  Clarity, UA cloudy (*)    Blood, UA small (*)    Protein Ur, POC =30 (*)    Leukocytes, UA Large (3+) (*)    All other components within normal limits  URINE CULTURE    EKG   Radiology No results found.  Procedures Procedures (including critical care time)  Medications Ordered in UC Medications - No data to display  Initial Impression / Assessment and Plan / UC Course  I have reviewed the triage vital signs and the nursing notes.  Pertinent labs & imaging results that were available during my care of the patient were reviewed by me and considered in my medical decision making (see chart for details).     MDM: 1.  Acute cystitis with hematuria-Rx'd Bactrim DS 800/160 mg tablet: Take 1 tablet twice daily x 7 days; 2.  Urine frequency-UA revealed above, urine culture ordered, Rx'd Bactrim DS 800/160 mg tablet: Take 1 tablet twice daily x 7 days. Advised patient to take medication as directed with food to completion.  Encouraged to increase daily water intake to 64 ounces per day while taking this medication.  Advised we will follow-up with urine culture results once received.  Advised if symptoms worsen and/or unresolved please follow-up with PCP or here for further evaluation.  Patient discharged home, hemodynamically stable. Final Clinical Impressions(s) / UC Diagnoses   Final diagnoses:  Urinary frequency  Acute cystitis with hematuria     Discharge Instructions      Advised patient to take medication as directed with food to completion.  Encouraged to increase daily water intake to 64 ounces per day while taking this medication.  Advised we will follow-up with urine culture results once received.  Advised if symptoms worsen and/or unresolved please follow-up with PCP or here for further evaluation.     ED Prescriptions     Medication Sig Dispense Auth. Provider   sulfamethoxazole-trimethoprim (BACTRIM DS) 800-160 MG tablet  Take 1 tablet by mouth 2 (two) times daily for 7 days. 14 tablet Trevor Iha, FNP   fluconazole (DIFLUCAN) 150 MG tablet Take 1 tab p.o. for vaginal candidiasis, may repeat 1 tab p.o. in 3 days if symptoms are not resolved. 4 tablet Trevor Iha, FNP      PDMP not reviewed this encounter.   Trevor Iha, FNP 01/20/23 1044

## 2023-01-20 NOTE — ED Triage Notes (Signed)
Patient reports of lower back pain and discomfort with urination since last Sunday.   Home interventions: Azo (last taken this morning)

## 2023-01-20 NOTE — Discharge Instructions (Addendum)
Advised patient to take medication as directed with food to completion.  Encouraged to increase daily water intake to 64 ounces per day while taking this medication.  Advised we will follow-up with urine culture results once received.  Advised if symptoms worsen and/or unresolved please follow-up with PCP or here for further evaluation.

## 2023-01-23 LAB — URINE CULTURE: Culture: >=100000 — AB

## 2023-01-29 ENCOUNTER — Other Ambulatory Visit: Payer: Self-pay | Admitting: Family Medicine

## 2023-01-29 DIAGNOSIS — E7849 Other hyperlipidemia: Secondary | ICD-10-CM

## 2023-01-29 LAB — HM COLONOSCOPY

## 2023-01-31 ENCOUNTER — Encounter: Payer: Self-pay | Admitting: Family Medicine

## 2023-02-17 ENCOUNTER — Encounter: Payer: Self-pay | Admitting: Emergency Medicine

## 2023-02-17 ENCOUNTER — Ambulatory Visit
Admission: EM | Admit: 2023-02-17 | Discharge: 2023-02-17 | Disposition: A | Discharge status: Home / Self Care | Payer: Managed Care, Other (non HMO) | Attending: Family Medicine | Admitting: Family Medicine

## 2023-02-17 ENCOUNTER — Other Ambulatory Visit: Payer: Self-pay

## 2023-02-17 ENCOUNTER — Telehealth: Payer: Self-pay | Admitting: Emergency Medicine

## 2023-02-17 ENCOUNTER — Ambulatory Visit: Payer: Managed Care, Other (non HMO)

## 2023-02-17 DIAGNOSIS — R918 Other nonspecific abnormal finding of lung field: Secondary | ICD-10-CM

## 2023-02-17 DIAGNOSIS — R059 Cough, unspecified: Secondary | ICD-10-CM

## 2023-02-17 DIAGNOSIS — J40 Bronchitis, not specified as acute or chronic: Secondary | ICD-10-CM | POA: Diagnosis not present

## 2023-02-17 MED ORDER — BENZONATATE 200 MG PO CAPS: 200.0000 mg | ORAL_CAPSULE | Freq: Three times a day (TID) | ORAL | 0 refills | Status: AC | PRN

## 2023-02-17 MED ORDER — DOXYCYCLINE HYCLATE 100 MG PO CAPS: 100.0000 mg | ORAL_CAPSULE | Freq: Two times a day (BID) | ORAL | 0 refills | Status: AC

## 2023-02-17 MED ORDER — PREDNISONE 10 MG (21) PO TBPK: ORAL_TABLET | Freq: Every day | ORAL | 0 refills | Status: DC

## 2023-02-17 MED ORDER — PROMETHAZINE-DM 6.25-15 MG/5ML PO SYRP: 5.0000 mL | ORAL_SOLUTION | Freq: Two times a day (BID) | ORAL | 0 refills | Status: DC | PRN

## 2023-02-17 NOTE — ED Triage Notes (Signed)
Pt reports persistent cough for a week and fatigue.

## 2023-02-17 NOTE — Discharge Instructions (Addendum)
Advised patient to take medication as directed with food to completion.  Advised patient to take prednisone with first dose of doxycycline until complete.  Advised may take Tessalon capsules daily or as needed for cough.  Advised may use Promethazine DM at night for cough prior to sleep due to sedate of effects.  Encouraged to increase daily water intake to 64 ounces per day while taking these medications.  Advised we will follow-up with chest x-ray results once received.  Advised if symptoms worsen and/or unresolved please follow-up with PCP or here for further evaluation.

## 2023-02-17 NOTE — Telephone Encounter (Signed)
Advised patient of CXR results. No pneumonia but does show some bronchitis. Advised patient to start medication as directed. Patient is agreeable.

## 2023-02-17 NOTE — ED Provider Notes (Signed)
[facility]    CSN: [id number] Arrival date & time: 02/17/23  5427      History   Chief Complaint Chief Complaint  Patient presents with   Cough    HPI Tina Meyer is a 62 y.o. female.   HPI pleasant 62 year old female presents with cough and fatigue for 1 week.  Patient is concerned with either bronchitis or pneumonia PMH significant for mild aortic sclerosis, HTN, and HLD.  Patient request work note for Thursday, 02/14/2023.  Past Medical History:  Diagnosis Date   Depression    Hyperlipidemia    Hypertension     Patient Active Problem List   Diagnosis Date Noted   Wheezing 06/28/2022   IFG (impaired fasting glucose) 12/14/2019   CKD (chronic kidney disease) stage 2, GFR 60-89 ml/min 12/14/2019   Mild aortic sclerosis 01/30/2016   Heart murmur 08/07/2011   DERMATITIS, ATOPIC 12/19/2010   Hyperlipidemia 05/27/2008   ANXIETY DEPRESSION 05/27/2008   HYPERTENSION, BENIGN 05/27/2008   INSOMNIA 05/27/2008    Past Surgical History:  Procedure Laterality Date   ABDOMINAL HYSTERECTOMY  04/16/2002   TOTAL ABDOMINAL HYSTERECTOMY  2004    OB History     Gravida  0   Para  0   Term  0   Preterm  0   AB  0   Living  0      SAB  0   IAB  0   Ectopic  0   Multiple  0   Live Births  0            Home Medications    Prior to Admission medications   Medication Sig Start Date End Date Taking? Authorizing Provider  benzonatate (TESSALON) 200 MG capsule Take 1 capsule (200 mg total) by mouth 3 (three) times daily as needed for up to 7 days. 02/17/23 02/24/23 Yes Trevor Iha, FNP  doxycycline (VIBRAMYCIN) 100 MG capsule Take 1 capsule (100 mg total) by mouth 2 (two) times daily for 7 days. 02/17/23 02/24/23 Yes Trevor Iha, FNP  predniSONE (STERAPRED UNI-PAK 21 TAB) 10 MG (21) TBPK tablet Take by mouth daily. Take 6 tabs by mouth daily  for 2 days, then 5 tabs for 2 days, then 4 tabs for 2 days, then 3 tabs for 2 days, 2 tabs  for 2 days, then 1 tab by mouth daily for 2 days 02/17/23  Yes Trevor Iha, FNP  promethazine-dextromethorphan (PROMETHAZINE-DM) 6.25-15 MG/5ML syrup Take 5 mLs by mouth 2 (two) times daily as needed for cough. 02/17/23  Yes Trevor Iha, FNP  albuterol (PROVENTIL) (2.5 MG/3ML) 0.083% nebulizer solution Take 3 mLs (2.5 mg total) by nebulization every 6 (six) hours as needed for wheezing or shortness of breath. 06/28/22   Charlton Amor, DO  albuterol (VENTOLIN HFA) 108 (90 Base) MCG/ACT inhaler Inhale 2 puffs into the lungs every 6 (six) hours as needed for wheezing. 12/12/22   Agapito Games, MD  ALPRAZolam Prudy Feeler) 0.5 MG tablet TAKE 1 TABLET (0.5 MG TOTAL) BY MOUTH AT BEDTIME AS NEEDED FOR ANXIETY. TRY NOT TO TAKE EVERY NIGHT. 01/21/23   Agapito Games, MD  atorvastatin (LIPITOR) 40 MG tablet TAKE 1 TABLET BY MOUTH EVERY DAY 01/30/23   Agapito Games, MD  azelastine (ASTELIN) 0.1 % nasal spray Place 1-2 sprays into both nostrils 2 (two) times daily. Use in each nostril as directed 12/12/22   Agapito Games, MD  candesartan-hydrochlorothiazide (ATACAND HCT) 16-12.5 MG tablet TAKE 1 TABLET BY  MOUTH EVERY DAY 01/21/23   Agapito Games, MD  estradiol (ESTRACE) 0.1 MG/GM vaginal cream Place 1 g vaginally 3 (three) times a week. 05/09/22   Agapito Games, MD  fluconazole (DIFLUCAN) 150 MG tablet Take 1 tab p.o. for vaginal candidiasis, may repeat 1 tab p.o. in 3 days if symptoms are not resolved. 01/20/23   Trevor Iha, FNP  fluticasone (FLONASE) 50 MCG/ACT nasal spray SPRAY 2 SPRAYS INTO EACH NOSTRIL EVERY DAY 11/30/20   Agapito Games, MD  fluvoxaMINE (LUVOX) 100 MG tablet TAKE 1 TABLET BY MOUTH EVERY DAY 01/21/23   Agapito Games, MD  ondansetron (ZOFRAN) 8 MG tablet Take 1 tablet (8 mg total) by mouth every 8 (eight) hours as needed. 08/16/22   Eustace Moore, MD  traZODone (DESYREL) 50 MG tablet TAKE 0.5-1 TABLETS (25-50 MG TOTAL) BY MOUTH DAILY.  07/17/22   Agapito Games, MD    Family History Family History  Problem Relation Age of Onset   Asthma Mother    Cerebral aneurysm Father    Breast cancer Maternal Aunt    Heart disease Maternal Aunt     Social History Social History   Tobacco Use   Smoking status: Never   Smokeless tobacco: Never  Vaping Use   Vaping status: Never Used  Substance Use Topics   Alcohol use: No    Alcohol/week: 0.0 standard drinks of alcohol   Drug use: No     Allergies   Ace inhibitors, Lisinopril, Aspirin, Augmentin [amoxicillin-pot clavulanate], Losartan, and Rosuvastatin   Review of Systems Review of Systems  Constitutional:  Positive for fatigue.  Respiratory:  Positive for cough.   All other systems reviewed and are negative.    Physical Exam Triage Vital Signs ED Triage Vitals  Encounter Vitals Group     BP 02/17/23 0840 109/75     Systolic BP Percentile --      Diastolic BP Percentile --      Pulse Rate 02/17/23 0840 91     Resp 02/17/23 0840 16     Temp 02/17/23 0840 98 F (36.7 C)     Temp Source 02/17/23 0840 Oral     SpO2 02/17/23 0840 98 %     Weight --      Height --      Head Circumference --      Peak Flow --      Pain Score 02/17/23 0842 0     Pain Loc --      Pain Education --      Exclude from Growth Chart --    No data found.  Updated Vital Signs BP 109/75 (BP Location: Right Arm)   Pulse 91   Temp 98 F (36.7 C) (Oral)   Resp 16   SpO2 98%      Physical Exam Vitals and nursing note reviewed.  Constitutional:      Appearance: Normal appearance. She is ill-appearing.  HENT:     Head: Normocephalic and atraumatic.     Right Ear: Tympanic membrane, ear canal and external ear normal.     Left Ear: Tympanic membrane, ear canal and external ear normal.     Nose: Nose normal.     Mouth/Throat:     Mouth: Mucous membranes are moist.     Pharynx: Oropharynx is clear.  Eyes:     Extraocular Movements: Extraocular movements intact.      Conjunctiva/sclera: Conjunctivae normal.     Pupils: Pupils are equal,  round, and reactive to light.  Cardiovascular:     Rate and Rhythm: Normal rate and regular rhythm.     Pulses: Normal pulses.     Heart sounds: Normal heart sounds.  Pulmonary:     Effort: Pulmonary effort is normal.     Breath sounds: Rhonchi present. No wheezing or rales.     Comments: Diffuse scattered rhonchi throughout, infrequent nonproductive cough on exam diminished breath sounds over bases Musculoskeletal:        General: Normal range of motion.     Cervical back: Normal range of motion and neck supple.  Skin:    General: Skin is warm and dry.  Neurological:     General: No focal deficit present.     Mental Status: She is alert and oriented to person, place, and time. Mental status is at baseline.  Psychiatric:        Mood and Affect: Mood normal.        Behavior: Behavior normal.      UC Treatments / Results  Labs (all labs ordered are listed, but only abnormal results are displayed) Labs Reviewed - No data to display  EKG   Radiology DG Chest 2 View  Result Date: 02/17/2023 CLINICAL DATA:  Cough x1 week EXAM: CHEST - 2 VIEW COMPARISON:  07/10/2019 FINDINGS: Prominent bibasilar interstitial markings as before. No new infiltrate. Heart size and mediastinal contours are within normal limits. Aortic Atherosclerosis (ICD10-170.0). No effusion. Visualized bones unremarkable. IMPRESSION: 1. No acute findings. 2. Chronic bibasilar interstitial prominence. Electronically Signed   By: Corlis Leak M.D.   On: 02/17/2023 09:41    Procedures Procedures (including critical care time)  Medications Ordered in UC Medications - No data to display  Initial Impression / Assessment and Plan / UC Course  I have reviewed the triage vital signs and the nursing notes.  Pertinent labs & imaging results that were available during my care of the patient were reviewed by me and considered in my medical decision making  (see chart for details).     MDM: 1.  Cough, unspecified type-chest x-ray results revealed above, Rx'd doxycycline 100 mg capsule: Take 1 capsule twice daily x 7 days, Rx'd Tessalon 200 mg capsules: Take 1 capsule 3 times daily, as needed for cough, Rx'd Promethazine DM 6.25-15 Mg/5 mL syrup: Take 5 mL twice daily, as needed for cough; 2.  Bronchitis-Rx Sterapred Unipak (tapering from 60 mg to 10 mg over 10 days). Advised patient to take medication as directed with food to completion.  Advised patient to take prednisone with first dose of doxycycline until complete.  Advised may take Tessalon capsules daily or as needed for cough.  Advised may use Promethazine DM at night for cough prior to sleep due to sedate of effects.  Encouraged to increase daily water intake to 64 ounces per day while taking these medications.  Advised we will follow-up with chest x-ray results once patient discharged home, hemodynamically stable.  Work note provided to patient prior to discharge per request. Final Clinical Impressions(s) / UC Diagnoses   Final diagnoses:  Cough, unspecified type  Bronchitis     Discharge Instructions      Advised patient to take medication as directed with food to completion.  Advised patient to take prednisone with first dose of doxycycline until complete.  Advised may take Tessalon capsules daily or as needed for cough.  Advised may use Promethazine DM at night for cough prior to sleep due to sedate of effects.  Encouraged to increase daily water intake to 64 ounces per day while taking these medications.  Advised we will follow-up with chest x-ray results once received.  Advised if symptoms worsen and/or unresolved please follow-up with PCP or here for further evaluation.     ED Prescriptions     Medication Sig Dispense Auth. Provider   doxycycline (VIBRAMYCIN) 100 MG capsule Take 1 capsule (100 mg total) by mouth 2 (two) times daily for 7 days. 14 capsule Trevor Iha, FNP    predniSONE (STERAPRED UNI-PAK 21 TAB) 10 MG (21) TBPK tablet Take by mouth daily. Take 6 tabs by mouth daily  for 2 days, then 5 tabs for 2 days, then 4 tabs for 2 days, then 3 tabs for 2 days, 2 tabs for 2 days, then 1 tab by mouth daily for 2 days 42 tablet Trevor Iha, FNP   promethazine-dextromethorphan (PROMETHAZINE-DM) 6.25-15 MG/5ML syrup Take 5 mLs by mouth 2 (two) times daily as needed for cough. 118 mL Trevor Iha, FNP   benzonatate (TESSALON) 200 MG capsule Take 1 capsule (200 mg total) by mouth 3 (three) times daily as needed for up to 7 days. 40 capsule Trevor Iha, FNP      PDMP not reviewed this encounter.   Trevor Iha, FNP 02/17/23 [phone]

## 2023-03-24 ENCOUNTER — Other Ambulatory Visit: Payer: Self-pay | Admitting: Family Medicine

## 2023-03-24 DIAGNOSIS — F341 Dysthymic disorder: Secondary | ICD-10-CM

## 2023-04-01 ENCOUNTER — Other Ambulatory Visit: Payer: Self-pay | Admitting: Family Medicine

## 2023-04-01 DIAGNOSIS — F341 Dysthymic disorder: Secondary | ICD-10-CM

## 2023-04-02 MED ORDER — ALPRAZOLAM 0.5 MG PO TABS
0.5000 mg | ORAL_TABLET | Freq: Every evening | ORAL | 0 refills | Status: DC | PRN
Start: 2023-04-02 — End: 2023-04-05

## 2023-04-05 NOTE — Telephone Encounter (Signed)
Meds ordered this encounter  Medications   ALPRAZolam (XANAX) 0.5 MG tablet    Sig: TAKE 1 TABLET (0.5 MG TOTAL) BY MOUTH AT BEDTIME AS NEEDED FOR ANXIETY. TRY NOT TO TAKE EVERY NIGHT.    Dispense:  30 tablet    Refill:  0    Not to exceed 5 additional fills before 07/20/2023

## 2023-04-25 ENCOUNTER — Other Ambulatory Visit: Payer: Self-pay | Admitting: Family Medicine

## 2023-04-25 DIAGNOSIS — I10 Essential (primary) hypertension: Secondary | ICD-10-CM

## 2023-05-04 ENCOUNTER — Other Ambulatory Visit: Payer: Self-pay | Admitting: Family Medicine

## 2023-05-04 DIAGNOSIS — E7849 Other hyperlipidemia: Secondary | ICD-10-CM

## 2023-05-09 ENCOUNTER — Other Ambulatory Visit: Payer: Self-pay

## 2023-05-09 ENCOUNTER — Other Ambulatory Visit: Payer: Self-pay | Admitting: *Deleted

## 2023-05-09 DIAGNOSIS — E7849 Other hyperlipidemia: Secondary | ICD-10-CM

## 2023-05-09 MED ORDER — ATORVASTATIN CALCIUM 40 MG PO TABS
40.0000 mg | ORAL_TABLET | Freq: Every day | ORAL | 0 refills | Status: DC
Start: 2023-05-09 — End: 2023-05-09

## 2023-05-09 MED ORDER — ATORVASTATIN CALCIUM 40 MG PO TABS
40.0000 mg | ORAL_TABLET | Freq: Every day | ORAL | 0 refills | Status: DC
Start: 2023-05-09 — End: 2023-08-20

## 2023-05-14 ENCOUNTER — Other Ambulatory Visit: Payer: Self-pay | Admitting: Family Medicine

## 2023-05-15 ENCOUNTER — Ambulatory Visit (INDEPENDENT_AMBULATORY_CARE_PROVIDER_SITE_OTHER): Payer: 59 | Admitting: Family Medicine

## 2023-05-15 ENCOUNTER — Encounter: Payer: Self-pay | Admitting: Family Medicine

## 2023-05-15 VITALS — BP 128/71 | HR 89 | Ht 62.0 in | Wt 157.0 lb

## 2023-05-15 DIAGNOSIS — F341 Dysthymic disorder: Secondary | ICD-10-CM

## 2023-05-15 DIAGNOSIS — N952 Postmenopausal atrophic vaginitis: Secondary | ICD-10-CM | POA: Diagnosis not present

## 2023-05-15 DIAGNOSIS — Z0000 Encounter for general adult medical examination without abnormal findings: Secondary | ICD-10-CM | POA: Diagnosis not present

## 2023-05-15 DIAGNOSIS — R062 Wheezing: Secondary | ICD-10-CM | POA: Diagnosis not present

## 2023-05-15 LAB — POCT GLYCOSYLATED HEMOGLOBIN (HGB A1C): Hemoglobin A1C: 5.7 % — AB (ref 4.0–5.6)

## 2023-05-15 MED ORDER — ALPRAZOLAM 0.5 MG PO TABS
0.5000 mg | ORAL_TABLET | Freq: Every evening | ORAL | 0 refills | Status: DC | PRN
Start: 2023-05-15 — End: 2023-11-13

## 2023-05-15 MED ORDER — ESTRADIOL 0.1 MG/GM VA CREA
1.0000 g | TOPICAL_CREAM | VAGINAL | 11 refills | Status: AC
Start: 2023-05-15 — End: ?

## 2023-05-15 MED ORDER — AZELASTINE HCL 0.1 % NA SOLN: 1.0000 | Freq: Two times a day (BID) | NASAL | 12 refills | Status: AC

## 2023-05-15 MED ORDER — ALBUTEROL SULFATE HFA 108 (90 BASE) MCG/ACT IN AERS
2.0000 | INHALATION_SPRAY | Freq: Four times a day (QID) | RESPIRATORY_TRACT | 1 refills | Status: DC | PRN
Start: 2023-05-15 — End: 2023-06-28

## 2023-05-15 NOTE — Progress Notes (Addendum)
Complete physical exam  Patient: Tina Meyer   DOB: [birth year]   62 y.o. Female  MRN: [id number]  Subjective:    Chief Complaint  Patient presents with   Annual Exam    Tina Meyer is a 63 y.o. female who presents today for a complete physical exam. She reports consuming a general diet.  Working out for 30 min 3 days per week.   She generally feels well. She reports sleeping well. She does not have additional problems to discuss today.   Brought form to be completed.    Most recent fall risk assessment:    05/15/2023    9:06 AM  Fall Risk   Falls in the past year? 0  Number falls in past yr: 0  Injury with Fall? 0  Risk for fall due to : No Fall Risks  Follow up Falls evaluation completed     Most recent depression screenings:    05/15/2023    9:06 AM 12/12/2022    8:23 AM  PHQ 2/9 Scores  PHQ - 2 Score 0 0  PHQ- 9 Score 0 0        Patient Care Team: Agapito Games, MD as PCP - General Bonnye Fava, MD as Referring Physician (Obstetrics and Gynecology) Harl Bowie, MD as Referring Physician (Obstetrics and Gynecology)   Outpatient Medications Prior to Visit  Medication Sig   albuterol (PROVENTIL) (2.5 MG/3ML) 0.083% nebulizer solution Take 3 mLs (2.5 mg total) by nebulization every 6 (six) hours as needed for wheezing or shortness of breath.   atorvastatin (LIPITOR) 40 MG tablet Take 1 tablet (40 mg total) by mouth daily.   candesartan-hydrochlorothiazide (ATACAND HCT) 16-12.5 MG tablet TAKE 1 TABLET BY MOUTH EVERY DAY   fluconazole (DIFLUCAN) 150 MG tablet Take 1 tab p.o. for vaginal candidiasis, may repeat 1 tab p.o. in 3 days if symptoms are not resolved.   fluticasone (FLONASE) 50 MCG/ACT nasal spray SPRAY 2 SPRAYS INTO EACH NOSTRIL EVERY DAY   fluvoxaMINE (LUVOX) 100 MG tablet TAKE 1 TABLET BY MOUTH EVERY DAY   ondansetron (ZOFRAN) 8 MG tablet Take 1 tablet (8 mg total) by mouth every 8 (eight) hours as needed.   traZODone  (DESYREL) 50 MG tablet TAKE 0.5-1 TABLETS (25-50 MG TOTAL) BY MOUTH DAILY.   [DISCONTINUED] albuterol (VENTOLIN HFA) 108 (90 Base) MCG/ACT inhaler Inhale 2 puffs into the lungs every 6 (six) hours as needed for wheezing.   [DISCONTINUED] ALPRAZolam (XANAX) 0.5 MG tablet TAKE 1 TABLET (0.5 MG TOTAL) BY MOUTH AT BEDTIME AS NEEDED FOR ANXIETY. TRY NOT TO TAKE EVERY NIGHT.   [DISCONTINUED] azelastine (ASTELIN) 0.1 % nasal spray Place 1-2 sprays into both nostrils 2 (two) times daily. Use in each nostril as directed   [DISCONTINUED] estradiol (ESTRACE) 0.1 MG/GM vaginal cream Place 1 g vaginally 3 (three) times a week.   [DISCONTINUED] predniSONE (STERAPRED UNI-PAK 21 TAB) 10 MG (21) TBPK tablet Take by mouth daily. Take 6 tabs by mouth daily  for 2 days, then 5 tabs for 2 days, then 4 tabs for 2 days, then 3 tabs for 2 days, 2 tabs for 2 days, then 1 tab by mouth daily for 2 days   [DISCONTINUED] promethazine-dextromethorphan (PROMETHAZINE-DM) 6.25-15 MG/5ML syrup Take 5 mLs by mouth 2 (two) times daily as needed for cough.   No facility-administered medications prior to visit.    ROS        Objective:     BP 128/71   Pulse  89   Ht 5\' 2"  (1.575 m)   Wt 157 lb (71.2 kg)   SpO2 98%   BMI 28.72 kg/m     Physical Exam Constitutional:      Appearance: Normal appearance.  HENT:     Head: Normocephalic and atraumatic.     Right Ear: Tympanic membrane, ear canal and external ear normal.     Left Ear: Tympanic membrane, ear canal and external ear normal.     Nose: Nose normal.     Mouth/Throat:     Pharynx: Oropharynx is clear.  Eyes:     Extraocular Movements: Extraocular movements intact.     Conjunctiva/sclera: Conjunctivae normal.     Pupils: Pupils are equal, round, and reactive to light.  Neck:     Thyroid: No thyromegaly.  Cardiovascular:     Rate and Rhythm: Normal rate and regular rhythm.     Heart sounds: Murmur heard.  Pulmonary:     Effort: Pulmonary effort is normal.      Breath sounds: Normal breath sounds.  Abdominal:     General: Bowel sounds are normal.     Palpations: Abdomen is soft.     Tenderness: There is no abdominal tenderness.  Musculoskeletal:        General: No swelling.     Cervical back: Neck supple.  Skin:    General: Skin is warm and dry.  Neurological:     Mental Status: She is oriented to person, place, and time.  Psychiatric:        Mood and Affect: Mood normal.        Behavior: Behavior normal.      Results for orders placed or performed in visit on 05/15/23  POCT HgB A1C  Result Value Ref Range   Hemoglobin A1C 5.7 (A) 4.0 - 5.6 %   HbA1c POC (<> result, manual entry)     HbA1c, POC (prediabetic range)     HbA1c, POC (controlled diabetic range)          Assessment & Plan:    Routine Health Maintenance and Physical Exam  Immunization History  Administered Date(s) Administered   Influenza Whole 01/16/2010   Influenza,inj,Quad PF,6+ Mos 02/08/2014, 01/30/2016, 01/21/2019, 02/28/2020   Influenza-Unspecified 01/14/2013, 02/07/2017, 01/14/2018   Moderna Sars-Covid-2 Vaccination 11/29/2019, 01/05/2020   Td 10/10/2012   Tdap 03/01/2022    Health Maintenance  Topic Date Due   Pneumococcal Vaccine 24-63 Years old (1 of 2 - PCV) Never done   Zoster Vaccines- Shingrix (1 of 2) Never done   COVID-19 Vaccine (3 - 2024-25 season) 12/16/2022   INFLUENZA VACCINE  07/15/2023 (Originally 11/15/2022)   MAMMOGRAM  12/11/2024   Colonoscopy  01/28/2026   DTaP/Tdap/Td (3 - Td or Tdap) 03/01/2032   Hepatitis C Screening  Completed   HIV Screening  Completed   HPV VACCINES  Aged Out    Discussed health benefits of physical activity, and encouraged her to engage in regular exercise appropriate for her age and condition.  Problem List Items Addressed This Visit       Other   Wheezing   Relevant Medications   albuterol (VENTOLIN HFA) 108 (90 Base) MCG/ACT inhaler   ANXIETY DEPRESSION   Relevant Medications   ALPRAZolam  (XANAX) 0.5 MG tablet   Other Visit Diagnoses       Wellness examination    -  Primary   Relevant Orders   POCT HgB A1C (Completed)     Vaginal atrophy  Relevant Medications   estradiol (ESTRACE) 0.1 MG/GM vaginal cream       Keep up a regular exercise program and make sure you are eating a healthy diet Try to eat 4 servings of dairy a day, or if you are lactose intolerant take a calcium with vitamin D daily.  Encouraged to consider Prevnar 20.  Declined shingles vaccines.   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    Due for A1C.    Needs refills.    Return in about 6 months (around 11/12/2023) for Pre-diabetes.     Nani Gasser, MD

## 2023-06-28 ENCOUNTER — Other Ambulatory Visit: Payer: Self-pay

## 2023-06-28 ENCOUNTER — Ambulatory Visit
Admission: EM | Admit: 2023-06-28 | Discharge: 2023-06-28 | Disposition: A | Discharge status: Home / Self Care | Attending: Family Medicine | Admitting: Family Medicine

## 2023-06-28 DIAGNOSIS — J0100 Acute maxillary sinusitis, unspecified: Secondary | ICD-10-CM | POA: Diagnosis not present

## 2023-06-28 DIAGNOSIS — J209 Acute bronchitis, unspecified: Secondary | ICD-10-CM | POA: Diagnosis not present

## 2023-06-28 DIAGNOSIS — R062 Wheezing: Secondary | ICD-10-CM | POA: Diagnosis not present

## 2023-06-28 MED ORDER — ALBUTEROL SULFATE HFA 108 (90 BASE) MCG/ACT IN AERS: 2.0000 | INHALATION_SPRAY | Freq: Four times a day (QID) | RESPIRATORY_TRACT | 1 refills | Status: AC | PRN

## 2023-06-28 MED ORDER — DOXYCYCLINE HYCLATE 100 MG PO CAPS
100.0000 mg | ORAL_CAPSULE | Freq: Two times a day (BID) | ORAL | 0 refills | Status: DC
Start: 2023-06-28 — End: 2023-11-13

## 2023-06-28 MED ORDER — PREDNISONE 20 MG PO TABS
20.0000 mg | ORAL_TABLET | Freq: Every day | ORAL | 0 refills | Status: AC
Start: 2023-06-28 — End: 2023-07-03

## 2023-06-28 MED ORDER — PROMETHAZINE-DM 6.25-15 MG/5ML PO SYRP
5.0000 mL | ORAL_SOLUTION | Freq: Three times a day (TID) | ORAL | 0 refills | Status: DC | PRN
Start: 2023-06-28 — End: 2023-11-13

## 2023-06-28 NOTE — Discharge Instructions (Signed)
 Take all medication as prescribed.  Hydrate well with fluids.  Return if symptoms worsen or do not improve.

## 2023-06-28 NOTE — ED Provider Notes (Signed)
 [facility]    CSN: [id number] Arrival date & time: 06/28/23  0940      History   Chief Complaint Chief Complaint  Patient presents with   Nasal Congestion    HPI Tina Meyer is a 63 y.o. female.   HPI Patient here today for evaluation of 3 weeks of cough and over the last several days cough has become productive and she is now having sinus pressure and sinus headache.  She has not had fever.  She reports increased use over the last few days of her albuterol inhaler due to shortness of breath and episodes of wheezing.  Patient has no history of asthma or COPD.  Patient endorses that she typically wheezes if she gets a significant for respiratory illness.  Dors is a decrease in appetite but no other viral symptoms present.  Past Medical History:  Diagnosis Date   Depression    Hyperlipidemia    Hypertension     Patient Active Problem List   Diagnosis Date Noted   Wheezing 06/28/2022   IFG (impaired fasting glucose) 12/14/2019   CKD (chronic kidney disease) stage 2, GFR 60-89 ml/min 12/14/2019   Mild aortic sclerosis 01/30/2016   Heart murmur 08/07/2011   DERMATITIS, ATOPIC 12/19/2010   Hyperlipidemia 05/27/2008   ANXIETY DEPRESSION 05/27/2008   HYPERTENSION, BENIGN 05/27/2008   INSOMNIA 05/27/2008    Past Surgical History:  Procedure Laterality Date   ABDOMINAL HYSTERECTOMY  04/16/2002   TOTAL ABDOMINAL HYSTERECTOMY  2004    OB History     Gravida  0   Para  0   Term  0   Preterm  0   AB  0   Living  0      SAB  0   IAB  0   Ectopic  0   Multiple  0   Live Births  0            Home Medications    Prior to Admission medications   Medication Sig Start Date End Date Taking? Authorizing Provider  albuterol (PROVENTIL) (2.5 MG/3ML) 0.083% nebulizer solution Take 3 mLs (2.5 mg total) by nebulization every 6 (six) hours as needed for wheezing or shortness of breath. 06/28/23  Yes Bing Neighbors, NP  doxycycline  (VIBRAMYCIN) 100 MG capsule Take 1 capsule (100 mg total) by mouth 2 (two) times daily. 06/28/23  Yes Bing Neighbors, NP  predniSONE (DELTASONE) 20 MG tablet Take 1 tablet (20 mg total) by mouth daily with breakfast for 5 days. 06/28/23 07/03/23 Yes Bing Neighbors, NP  promethazine-dextromethorphan (PROMETHAZINE-DM) 6.25-15 MG/5ML syrup Take 5 mLs by mouth 3 (three) times daily as needed for cough (congestion). 06/28/23  Yes Bing Neighbors, NP  albuterol (VENTOLIN HFA) 108 (90 Base) MCG/ACT inhaler Inhale 2 puffs into the lungs every 6 (six) hours as needed for wheezing. 06/28/23   Bing Neighbors, NP  ALPRAZolam Prudy Feeler) 0.5 MG tablet Take 1 tablet (0.5 mg total) by mouth at bedtime as needed for anxiety. Try not to take every night. 05/15/23   Agapito Games, MD  atorvastatin (LIPITOR) 40 MG tablet Take 1 tablet (40 mg total) by mouth daily. 05/09/23   Agapito Games, MD  azelastine (ASTELIN) 0.1 % nasal spray Place 1-2 sprays into both nostrils 2 (two) times daily. Use in each nostril as directed 05/15/23   Agapito Games, MD  candesartan-hydrochlorothiazide (ATACAND HCT) 16-12.5 MG tablet TAKE 1 TABLET BY MOUTH EVERY DAY 04/26/23  Agapito Games, MD  estradiol (ESTRACE) 0.1 MG/GM vaginal cream Place 1 g vaginally 3 (three) times a week. 05/15/23   Agapito Games, MD  fluconazole (DIFLUCAN) 150 MG tablet Take 1 tab p.o. for vaginal candidiasis, may repeat 1 tab p.o. in 3 days if symptoms are not resolved. 01/20/23   Trevor Iha, FNP  fluticasone (FLONASE) 50 MCG/ACT nasal spray SPRAY 2 SPRAYS INTO EACH NOSTRIL EVERY DAY 11/30/20   Agapito Games, MD  fluvoxaMINE (LUVOX) 100 MG tablet TAKE 1 TABLET BY MOUTH EVERY DAY 01/21/23   Agapito Games, MD  ondansetron (ZOFRAN) 8 MG tablet Take 1 tablet (8 mg total) by mouth every 8 (eight) hours as needed. 08/16/22   Eustace Moore, MD  traZODone (DESYREL) 50 MG tablet TAKE 0.5-1 TABLETS (25-50 MG TOTAL)  BY MOUTH DAILY. 05/15/23   Agapito Games, MD    Family History Family History  Problem Relation Age of Onset   Asthma Mother    Cerebral aneurysm Father    Breast cancer Maternal Aunt    Heart disease Maternal Aunt     Social History Social History   Tobacco Use   Smoking status: Never   Smokeless tobacco: Never  Vaping Use   Vaping status: Never Used  Substance Use Topics   Alcohol use: No    Alcohol/week: 0.0 standard drinks of alcohol   Drug use: No     Allergies   Ace inhibitors, Lisinopril, Aspirin, Augmentin [amoxicillin-pot clavulanate], Losartan, and Rosuvastatin   Review of Systems Review of Systems Pertinent negatives listed in HPI  Physical Exam Triage Vital Signs ED Triage Vitals  Encounter Vitals Group     BP 06/28/23 0946 107/71     Systolic BP Percentile --      Diastolic BP Percentile --      Pulse Rate 06/28/23 0946 98     Resp 06/28/23 0946 16     Temp 06/28/23 0946 98.1 F (36.7 C)     Temp Source 06/28/23 0946 Oral     SpO2 06/28/23 0946 98 %     Weight --      Height --      Head Circumference --      Peak Flow --      Pain Score 06/28/23 0950 5     Pain Loc --      Pain Education --      Exclude from Growth Chart --    No data found.  Updated Vital Signs BP 107/71   Pulse 98   Temp 98.1 F (36.7 C) (Oral)   Resp 16   SpO2 98%   Visual Acuity Right Eye Distance:   Left Eye Distance:   Bilateral Distance:    Right Eye Near:   Left Eye Near:    Bilateral Near:     Physical Exam Vitals reviewed.  Constitutional:      Appearance: Normal appearance.  HENT:     Head: Normocephalic and atraumatic.     Right Ear: External ear normal.     Left Ear: External ear normal.     Nose: Congestion and rhinorrhea present.     Mouth/Throat:     Pharynx: No oropharyngeal exudate or posterior oropharyngeal erythema.  Eyes:     Extraocular Movements: Extraocular movements intact.     Conjunctiva/sclera: Conjunctivae normal.      Pupils: Pupils are equal, round, and reactive to light.  Cardiovascular:     Rate and Rhythm: Normal rate  and regular rhythm.  Pulmonary:     Breath sounds: Decreased breath sounds and wheezing (EXPIRATORY FINE) present.  Musculoskeletal:     Cervical back: Normal range of motion and neck supple.  Lymphadenopathy:     Cervical: No cervical adenopathy.  Skin:    General: Skin is warm and dry.  Neurological:     General: No focal deficit present.     Mental Status: She is alert.      UC Treatments / Results  Labs (all labs ordered are listed, but only abnormal results are displayed) Labs Reviewed - No data to display  EKG   Radiology No results found.  Procedures Procedures (including critical care time)  Medications Ordered in UC Medications - No data to display  Initial Impression / Assessment and Plan / UC Course  I have reviewed the triage vital signs and the nursing notes.  Pertinent labs & imaging results that were available during my care of the patient were reviewed by me and considered in my medical decision making (see chart for details).  1. Acute bronchitis, unspecified organism (Primary) - predniSONE (DELTASONE) 20 MG tablet; Take 1 tablet (20 mg total) by mouth daily with breakfast for 5 days.  Dispense: 5 tablet; Refill: 0 - promethazine-dextromethorphan (PROMETHAZINE-DM) 6.25-15 MG/5ML syrup; Take 5 mLs by mouth 3 (three) times daily as needed for cough (congestion).  Dispense: 180 mL; Refill: 0  2. Acute non-recurrent maxillary sinusitis - doxycycline (VIBRAMYCIN) 100 MG capsule; Take 1 capsule (100 mg total) by mouth 2 (two) times daily.  Dispense: 20 capsule; Refill: 0  3. Wheezing - albuterol (VENTOLIN HFA) 108 (90 Base) MCG/ACT inhaler; Inhale 2 puffs into the lungs every 6 (six) hours as needed for wheezing.  Dispense: 8 g; Refill: 1 - predniSONE (DELTASONE) 20 MG tablet; Take 1 tablet (20 mg total) by mouth daily with breakfast for 5 days.   Dispense: 5 tablet; Refill: 0 - promethazine-dextromethorphan (PROMETHAZINE-DM) 6.25-15 MG/5ML syrup; Take 5 mLs by mouth 3 (three) times daily as needed for cough (congestion).  Dispense: 180 mL; Refill: 0     Return precautions given if symptoms worsen or do not improve. Final Clinical Impressions(s) / UC Diagnoses   Final diagnoses:  Acute bronchitis, unspecified organism  Acute non-recurrent maxillary sinusitis     Discharge Instructions      Take all medication as prescribed.  Hydrate well with fluids.  Return if symptoms worsen or do not improve.     ED Prescriptions     Medication Sig Dispense Auth. Provider   albuterol (VENTOLIN HFA) 108 (90 Base) MCG/ACT inhaler Inhale 2 puffs into the lungs every 6 (six) hours as needed for wheezing. 8 g Bing Neighbors, NP   predniSONE (DELTASONE) 20 MG tablet Take 1 tablet (20 mg total) by mouth daily with breakfast for 5 days. 5 tablet Bing Neighbors, NP   promethazine-dextromethorphan (PROMETHAZINE-DM) 6.25-15 MG/5ML syrup Take 5 mLs by mouth 3 (three) times daily as needed for cough (congestion). 180 mL Bing Neighbors, NP   doxycycline (VIBRAMYCIN) 100 MG capsule Take 1 capsule (100 mg total) by mouth 2 (two) times daily. 20 capsule Bing Neighbors, NP      PDMP not reviewed this encounter.   Bing Neighbors, NP 06/28/23 1057

## 2023-06-28 NOTE — ED Triage Notes (Signed)
 X3 weeks has had cough. Has been taking cough medication and inhaler. Has c/o sneezing, chest congestion, headache since Monday. Cough is prod of green sputum.

## 2023-07-02 ENCOUNTER — Telehealth: Payer: Self-pay

## 2023-07-02 ENCOUNTER — Ambulatory Visit

## 2023-07-02 MED ORDER — BENZONATATE 200 MG PO CAPS: 200.0000 mg | ORAL_CAPSULE | Freq: Three times a day (TID) | ORAL | 0 refills | Status: DC | PRN

## 2023-07-02 NOTE — Telephone Encounter (Signed)
 Copied from CRM [id number]. Topic: Clinical - Medication Question >> Jul 02, 2023  8:40 AM Nila Nephew wrote: Reason for CRM: Patient calling in to state that she was recently seen in the urgent care and that she was provided a Dx of sinus infection and bronchitis. 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

## 2023-07-02 NOTE — Telephone Encounter (Signed)
 Please review the CRM note. Patient was seen in the ED on 06/28/23 for her symptoms.

## 2023-07-02 NOTE — Telephone Encounter (Signed)
 Meds ordered this encounter  Medications   benzonatate (TESSALON) 200 MG capsule    Sig: Take 1 capsule (200 mg total) by mouth 3 (three) times daily as needed for cough.    Dispense:  20 capsule    Refill:  0

## 2023-07-10 NOTE — Telephone Encounter (Signed)
 Contacted the patient. No answer. Left a detailed msg that Benzonatate rx has been sent to the pharmacy. Direct call back info provided.

## 2023-07-24 ENCOUNTER — Other Ambulatory Visit: Payer: Self-pay | Admitting: Family Medicine

## 2023-08-18 ENCOUNTER — Other Ambulatory Visit: Payer: Self-pay | Admitting: Family Medicine

## 2023-08-18 DIAGNOSIS — E7849 Other hyperlipidemia: Secondary | ICD-10-CM

## 2023-08-21 ENCOUNTER — Other Ambulatory Visit: Payer: Self-pay | Admitting: Family Medicine

## 2023-08-21 NOTE — Telephone Encounter (Signed)
 Copied from CRM [id number]. Topic: Clinical - Medication Refill >> Aug 21, 2023 10:21 AM Suzette B wrote: Medication:  fluvoxaMINE  (LUVOX ) 100 MG tablet  Has the patient contacted their pharmacy? Yes (Agent: If yes, when and what did the pharmacy advise?)Patient was told the provider deactivated the prescription and the patient would have to call and get another refil   This is the patient's preferred pharmacy:  [facility] [phone] - [place], [place] - [address] [place] [address] Phone: [phone] Fax: [phone]  Is this the correct pharmacy for this prescription? Yes If no, delete pharmacy and type the correct one.   Has the prescription been filled recently? Yes  Is the patient out of the medication? No taken meds for today and has 2 pills left  Has the patient been seen for an appointment in the last year OR does the patient have an upcoming appointment? Yes  Can we respond through MyChart? Yes  Agent: Please be advised that Rx refills may take up to 3 business days. We ask that you follow-up with your pharmacy.

## 2023-11-12 ENCOUNTER — Other Ambulatory Visit: Payer: Self-pay | Admitting: Family Medicine

## 2023-11-12 DIAGNOSIS — I10 Essential (primary) hypertension: Secondary | ICD-10-CM

## 2023-11-12 NOTE — Telephone Encounter (Signed)
Patient scheduled for tomorrow, thanks!

## 2023-11-13 ENCOUNTER — Ambulatory Visit (INDEPENDENT_AMBULATORY_CARE_PROVIDER_SITE_OTHER): Payer: Managed Care, Other (non HMO) | Admitting: Family Medicine

## 2023-11-13 ENCOUNTER — Encounter: Payer: Self-pay | Admitting: Family Medicine

## 2023-11-13 VITALS — BP 138/69 | HR 79 | Ht 62.0 in | Wt 154.0 lb

## 2023-11-13 DIAGNOSIS — N182 Chronic kidney disease, stage 2 (mild): Secondary | ICD-10-CM | POA: Diagnosis not present

## 2023-11-13 DIAGNOSIS — I10 Essential (primary) hypertension: Secondary | ICD-10-CM

## 2023-11-13 DIAGNOSIS — E7849 Other hyperlipidemia: Secondary | ICD-10-CM

## 2023-11-13 DIAGNOSIS — F341 Dysthymic disorder: Secondary | ICD-10-CM

## 2023-11-13 DIAGNOSIS — L989 Disorder of the skin and subcutaneous tissue, unspecified: Secondary | ICD-10-CM | POA: Diagnosis not present

## 2023-11-13 DIAGNOSIS — R7301 Impaired fasting glucose: Secondary | ICD-10-CM

## 2023-11-13 DIAGNOSIS — F5101 Primary insomnia: Secondary | ICD-10-CM

## 2023-11-13 LAB — POCT GLYCOSYLATED HEMOGLOBIN (HGB A1C): Hemoglobin A1C: 5.6 % (ref 4.0–5.6)

## 2023-11-13 LAB — POCT UA - MICROALBUMIN
Albumin/Creatinine Ratio, Urine, POC: <30
Creatinine, POC: 200 mg/dL
Microalbumin Ur, POC: 30 mg/L

## 2023-11-13 MED ORDER — FLUVOXAMINE MALEATE 100 MG PO TABS: 100.0000 mg | ORAL_TABLET | Freq: Every day | ORAL | 1 refills | Status: AC

## 2023-11-13 MED ORDER — ATORVASTATIN CALCIUM 40 MG PO TABS: 40.0000 mg | ORAL_TABLET | Freq: Every day | ORAL | 3 refills | Status: DC

## 2023-11-13 MED ORDER — ALPRAZOLAM 0.5 MG PO TABS
0.5000 mg | ORAL_TABLET | Freq: Every evening | ORAL | 0 refills | Status: DC | PRN
Start: 2023-11-13 — End: 2024-01-17

## 2023-11-13 NOTE — Assessment & Plan Note (Signed)
 Happy with current regime of fluvox.  Will refill Xanax . Continue to use sparingly.

## 2023-11-13 NOTE — Progress Notes (Signed)
 Established Patient Office Visit  Subjective  Patient ID: Tina Meyer, female    DOB: [birth year]  Age: 63 y.o. MRN: [id number]  Chief Complaint  Patient presents with   Medical Management of Chronic Issues     Pre-diabetes    HPI  Hypertension- Pt denies chest pain, SOB, dizziness, or heart palpitations.  Taking meds as directed w/o problems.  Denies medication side effects.  Pressures at home have been in the teens but even up into the low 130s occasionally especially if she is been a little bit more stressed at work.  Impaired fasting glucose-no increased thirst or urination. No symptoms consistent with hypoglycemia.  F/U CKD 3 - no changes  She has a skin lesion on right facial cheek she would like to have removed.  She would like referral to dermatology.  She does need refills on several of her medications today.    ROS    Objective:     BP 138/69   Pulse 79   Ht 5' 2 (1.575 m)   Wt 154 lb (69.9 kg)   SpO2 99%   BMI 28.17 kg/m    Physical Exam Vitals and nursing note reviewed.  Constitutional:      Appearance: Normal appearance.  HENT:     Head: Normocephalic and atraumatic.  Eyes:     Conjunctiva/sclera: Conjunctivae normal.  Cardiovascular:     Rate and Rhythm: Normal rate and regular rhythm.  Pulmonary:     Effort: Pulmonary effort is normal.     Breath sounds: Normal breath sounds.  Skin:    General: Skin is warm and dry.  Neurological:     Mental Status: She is alert.  Psychiatric:        Mood and Affect: Mood normal.      Results for orders placed or performed in visit on 11/13/23  POCT HgB A1C  Result Value Ref Range   Hemoglobin A1C 5.6 4.0 - 5.6 %   HbA1c POC (<> result, manual entry)     HbA1c, POC (prediabetic range)     HbA1c, POC (controlled diabetic range)    POCT UA - Microalbumin  Result Value Ref Range   Microalbumin Ur, POC 30 mg/L   Creatinine, POC 200 mg/dL   Albumin/Creatinine Ratio, Urine, POC <30        The 10-year ASCVD risk score (Arnett DK, et al., 2019) is: 7.7%    Assessment & Plan:   Problem List Items Addressed This Visit       Cardiovascular and Mediastinum   HYPERTENSION, BENIGN - Primary   Well controlled. Continue current regimen. Follow up in  35mo       Relevant Medications   atorvastatin  (LIPITOR) 40 MG tablet   Other Relevant Orders   CMP14+EGFR   Lipid panel   CBC     Endocrine   IFG (impaired fasting glucose)   Look good.    Lab Results  Component Value Date   HGBA1C 5.6 11/13/2023         Relevant Orders   CMP14+EGFR   Lipid panel   CBC   POCT HgB A1C (Completed)   POCT UA - Microalbumin (Completed)     Genitourinary   CKD (chronic kidney disease) stage 2, GFR 60-89 ml/min   Due for updated labs       Relevant Orders   CMP14+EGFR   Lipid panel   CBC     Other   INSOMNIA   Using trazodone  for sleep.  Hyperlipidemia   Due to recheck lipids.        Relevant Medications   atorvastatin  (LIPITOR) 40 MG tablet   ANXIETY DEPRESSION   Happy with current regime of fluvox.  Will refill Xanax . Continue to use sparingly.       Relevant Medications   ALPRAZolam  (XANAX ) 0.5 MG tablet   fluvoxaMINE  (LUVOX ) 100 MG tablet   Other Visit Diagnoses       Skin lesion       Relevant Orders   Ambulatory referral to Dermatology       Encouraged her to consider Pneumonia and shingles vaccine.    Return in about 6 months (around 05/15/2024) for Hypertension.    Dorothyann Byars, MD

## 2023-11-13 NOTE — Assessment & Plan Note (Signed)
Using trazodone for sleep.

## 2023-11-13 NOTE — Assessment & Plan Note (Signed)
 Due for updated labs.

## 2023-11-13 NOTE — Assessment & Plan Note (Signed)
Due to recheck lipids. 

## 2023-11-13 NOTE — Assessment & Plan Note (Signed)
 Look good.    Lab Results  Component Value Date   HGBA1C 5.6 11/13/2023

## 2023-11-13 NOTE — Assessment & Plan Note (Signed)
 Well controlled. Continue current regimen. Follow up in  6 mo

## 2023-11-24 ENCOUNTER — Other Ambulatory Visit: Payer: Self-pay

## 2023-11-24 ENCOUNTER — Ambulatory Visit
Admission: EM | Admit: 2023-11-24 | Discharge: 2023-11-24 | Disposition: A | Discharge status: Home / Self Care | Attending: Family Medicine | Admitting: Family Medicine

## 2023-11-24 ENCOUNTER — Encounter: Payer: Self-pay | Admitting: *Deleted

## 2023-11-24 DIAGNOSIS — R059 Cough, unspecified: Secondary | ICD-10-CM

## 2023-11-24 DIAGNOSIS — J0100 Acute maxillary sinusitis, unspecified: Secondary | ICD-10-CM | POA: Diagnosis not present

## 2023-11-24 DIAGNOSIS — R0981 Nasal congestion: Secondary | ICD-10-CM

## 2023-11-24 MED ORDER — DOXYCYCLINE HYCLATE 100 MG PO CAPS: 100.0000 mg | ORAL_CAPSULE | Freq: Two times a day (BID) | ORAL | 0 refills | Status: AC

## 2023-11-24 MED ORDER — PREDNISONE 20 MG PO TABS: ORAL_TABLET | ORAL | 0 refills | Status: DC

## 2023-11-24 MED ORDER — HYDROCODONE BIT-HOMATROP MBR 5-1.5 MG/5ML PO SOLN: 5.0000 mL | Freq: Four times a day (QID) | ORAL | 0 refills | Status: DC | PRN

## 2023-11-24 NOTE — ED Provider Notes (Signed)
 [facility]    CSN: [id number] Arrival date & time: 11/24/23  9164      History   Chief Complaint Chief Complaint  Patient presents with   Nasal Congestion   Cough    HPI Tina Meyer is a 63 y.o. female.   HPI 63 year old female presents with a runny nose, body aches, cough and sore throat for 4 days.  PMH significant for HTN, HLD, and anxiety.  Past Medical History:  Diagnosis Date   Depression    Hyperlipidemia    Hypertension     Patient Active Problem List   Diagnosis Date Noted   Wheezing 06/28/2022   IFG (impaired fasting glucose) 12/14/2019   CKD (chronic kidney disease) stage 2, GFR 60-89 ml/min 12/14/2019   Mild aortic sclerosis 01/30/2016   Heart murmur 08/07/2011   DERMATITIS, ATOPIC 12/19/2010   Hyperlipidemia 05/27/2008   ANXIETY DEPRESSION 05/27/2008   HYPERTENSION, BENIGN 05/27/2008   INSOMNIA 05/27/2008    Past Surgical History:  Procedure Laterality Date   ABDOMINAL HYSTERECTOMY  04/16/2002   TOTAL ABDOMINAL HYSTERECTOMY  2004    OB History     Gravida  0   Para  0   Term  0   Preterm  0   AB  0   Living  0      SAB  0   IAB  0   Ectopic  0   Multiple  0   Live Births  0            Home Medications    Prior to Admission medications   Medication Sig Start Date End Date Taking? Authorizing Provider  doxycycline  (VIBRAMYCIN ) 100 MG capsule Take 1 capsule (100 mg total) by mouth 2 (two) times daily for 7 days. 11/24/23 12/01/23 Yes Teddy Sharper, FNP  HYDROcodone  bit-homatropine (HYCODAN) 5-1.5 MG/5ML syrup Take 5 mLs by mouth every 6 (six) hours as needed for cough. 11/24/23  Yes Teddy Sharper, FNP  predniSONE  (DELTASONE ) 20 MG tablet Take 3 tabs PO daily x 5 days. 11/24/23  Yes Teddy Sharper, FNP  albuterol  (PROVENTIL ) (2.5 MG/3ML) 0.083% nebulizer solution Take 3 mLs (2.5 mg total) by nebulization every 6 (six) hours as needed for wheezing or shortness of breath. 06/28/23   Arloa Suzen RAMAN, NP   albuterol  (VENTOLIN  HFA) 108 (90 Base) MCG/ACT inhaler Inhale 2 puffs into the lungs every 6 (six) hours as needed for wheezing. 06/28/23   Arloa Suzen RAMAN, NP  ALPRAZolam  (XANAX ) 0.5 MG tablet Take 1 tablet (0.5 mg total) by mouth at bedtime as needed for anxiety. Try not to take every night. 11/13/23   Alvan Dorothyann BIRCH, MD  atorvastatin  (LIPITOR) 40 MG tablet Take 1 tablet (40 mg total) by mouth daily. 11/13/23   Alvan Dorothyann BIRCH, MD  azelastine  (ASTELIN ) 0.1 % nasal spray Place 1-2 sprays into both nostrils 2 (two) times daily. Use in each nostril as directed 05/15/23   Alvan Dorothyann BIRCH, MD  candesartan -hydrochlorothiazide  (ATACAND  HCT) 16-12.5 MG tablet TAKE 1 TABLET BY MOUTH EVERY DAY 11/12/23   Alvan Dorothyann BIRCH, MD  estradiol  (ESTRACE ) 0.1 MG/GM vaginal cream Place 1 g vaginally 3 (three) times a week. 05/15/23   Alvan Dorothyann BIRCH, MD  fluticasone  (FLONASE ) 50 MCG/ACT nasal spray SPRAY 2 SPRAYS INTO EACH NOSTRIL EVERY DAY 11/30/20   Alvan Dorothyann BIRCH, MD  fluvoxaMINE  (LUVOX ) 100 MG tablet Take 1 tablet (100 mg total) by mouth daily. 11/13/23   Alvan Dorothyann BIRCH, MD  ondansetron  (ZOFRAN )  8 MG tablet Take 1 tablet (8 mg total) by mouth every 8 (eight) hours as needed. 08/16/22   Maranda Jamee Jacob, MD  traZODone  (DESYREL ) 50 MG tablet TAKE 0.5-1 TABLETS (25-50 MG TOTAL) BY MOUTH DAILY. 11/12/23   Alvan Dorothyann BIRCH, MD    Family History Family History  Problem Relation Age of Onset   Asthma Mother    Cerebral aneurysm Father    Breast cancer Maternal Aunt    Heart disease Maternal Aunt     Social History Social History   Tobacco Use   Smoking status: Never   Smokeless tobacco: Never  Vaping Use   Vaping status: Never Used  Substance Use Topics   Alcohol use: No    Alcohol/week: 0.0 standard drinks of alcohol   Drug use: No     Allergies   Ace inhibitors, Lisinopril, Aspirin, Augmentin  [amoxicillin -pot clavulanate], Losartan , and  Rosuvastatin   Review of Systems Review of Systems  HENT:  Positive for congestion, ear pain, postnasal drip, rhinorrhea and sore throat.   Respiratory:  Positive for cough.   All other systems reviewed and are negative.    Physical Exam Triage Vital Signs ED Triage Vitals  Encounter Vitals Group     BP      Girls Systolic BP Percentile      Girls Diastolic BP Percentile      Boys Systolic BP Percentile      Boys Diastolic BP Percentile      Pulse      Resp      Temp      Temp src      SpO2      Weight      Height      Head Circumference      Peak Flow      Pain Score      Pain Loc      Pain Education      Exclude from Growth Chart    No data found.  Updated Vital Signs BP 120/77 (BP Location: Right Arm)   Pulse 87   Temp 99.3 F (37.4 C) (Oral)   Resp 20   Ht 5' 2 (1.575 m)   Wt 153 lb (69.4 kg)   SpO2 95%   BMI 27.98 kg/m   Visual Acuity Right Eye Distance:   Left Eye Distance:   Bilateral Distance:    Right Eye Near:   Left Eye Near:    Bilateral Near:     Physical Exam Vitals and nursing note reviewed.  Constitutional:      Appearance: Normal appearance. She is normal weight. She is ill-appearing.  HENT:     Head: Normocephalic and atraumatic.     Right Ear: Tympanic membrane and external ear normal.     Left Ear: Tympanic membrane and external ear normal.     Ears:     Comments: Significant eustachian tube dysfunction noted bilaterally    Nose:     Right Sinus: Maxillary sinus tenderness present.     Left Sinus: Maxillary sinus tenderness present.     Mouth/Throat:     Mouth: Mucous membranes are moist.     Pharynx: Oropharynx is clear.  Eyes:     Extraocular Movements: Extraocular movements intact.     Pupils: Pupils are equal, round, and reactive to light.  Cardiovascular:     Rate and Rhythm: Normal rate and regular rhythm.     Pulses: Normal pulses.     Heart sounds: Normal heart  sounds.  Pulmonary:     Effort: Pulmonary  effort is normal.     Breath sounds: Normal breath sounds. No wheezing, rhonchi or rales.     Comments: Frequent nonproductive cough on exam Musculoskeletal:        General: Normal range of motion.  Skin:    General: Skin is warm and dry.  Neurological:     General: No focal deficit present.     Mental Status: She is alert and oriented to person, place, and time. Mental status is at baseline.      UC Treatments / Results  Labs (all labs ordered are listed, but only abnormal results are displayed) Labs Reviewed - No data to display  EKG   Radiology No results found.  Procedures Procedures (including critical care time)  Medications Ordered in UC Medications - No data to display  Initial Impression / Assessment and Plan / UC Course  I have reviewed the triage vital signs and the nursing notes.  Pertinent labs & imaging results that were available during my care of the patient were reviewed by me and considered in my medical decision making (see chart for details).     MDM: 1.  Acute maxillary sinusitis, recurrence not specified-Rx'd doxycycline  100 mg capsule: Take 1 capsule twice daily x 7 days; 2.  Nasal congestion-Rx'd prednisone  20 mg tablet: Take 3 tablets p.o. daily x 5 days; 3.  Cough, unspecified type-Rx'd Hycodan 5-1.5 mg / 5 mL syrup: Take 5 mL by mouth every 6 hours as needed for cough. Advised patient to take medication as directed with food to completion.  Advised patient to take prednisone  with first dose of doxycycline  for the next 5 of 7 days.  Advised may take Hycodan cough syrup at night for cough prior to sleep due to sedative effects.  Encouraged to increase daily water intake to 64 ounces per day while taking these medications.  Advised if symptoms worsen and/or unresolved please follow-up with your PCP or here for further evaluation.  Patient discharged home, hemodynamically stable Final Clinical Impressions(s) / UC Diagnoses   Final diagnoses:  Acute  maxillary sinusitis, recurrence not specified  Nasal congestion  Cough, unspecified type     Discharge Instructions      Advised patient to take medication as directed with food to completion.  Advised patient to take prednisone  with first dose of doxycycline  for the next 5 of 7 days.  Advised may take Hycodan cough syrup at night for cough prior to sleep due to sedative effects.  Encouraged to increase daily water intake to 64 ounces per day while taking these medications.  Advised if symptoms worsen and/or unresolved please follow-up with your PCP or here for further evaluation.     ED Prescriptions     Medication Sig Dispense Auth. Provider   predniSONE  (DELTASONE ) 20 MG tablet Take 3 tabs PO daily x 5 days. 15 tablet Shanvi Moyd, FNP   doxycycline  (VIBRAMYCIN ) 100 MG capsule Take 1 capsule (100 mg total) by mouth 2 (two) times daily for 7 days. 14 capsule Giovanny Dugal, FNP   HYDROcodone  bit-homatropine (HYCODAN) 5-1.5 MG/5ML syrup Take 5 mLs by mouth every 6 (six) hours as needed for cough. 120 mL Teddy Sharper, FNP      I have reviewed the PDMP during this encounter.   Teddy Sharper, FNP 11/24/23 [id number]

## 2023-11-24 NOTE — ED Triage Notes (Signed)
 Patient states over a week of cough/congestion, sore throat has resolved.  Complains of bilateral pain behind both ears, sinus pressure.  Has taken OTC Sudafed and allergy meds with no relief

## 2023-11-24 NOTE — Discharge Instructions (Addendum)
 Advised patient to take medication as directed with food to completion.  Advised patient to take prednisone  with first dose of doxycycline  for the next 5 of 7 days.  Advised may take Hycodan cough syrup at night for cough prior to sleep due to sedative effects.  Encouraged to increase daily water intake to 64 ounces per day while taking these medications.  Advised if symptoms worsen and/or unresolved please follow-up with your PCP or here for further evaluation.

## 2023-12-05 ENCOUNTER — Other Ambulatory Visit: Payer: Self-pay | Admitting: Family Medicine

## 2023-12-05 DIAGNOSIS — I10 Essential (primary) hypertension: Secondary | ICD-10-CM

## 2024-01-14 ENCOUNTER — Other Ambulatory Visit: Payer: Self-pay | Admitting: Family Medicine

## 2024-01-14 DIAGNOSIS — F341 Dysthymic disorder: Secondary | ICD-10-CM

## 2024-01-17 NOTE — Telephone Encounter (Signed)
 Last OV and RF 11/13/2023 #30 Next OV 04/29/2024

## 2024-03-03 ENCOUNTER — Other Ambulatory Visit: Payer: Self-pay | Admitting: Family Medicine

## 2024-03-03 DIAGNOSIS — F341 Dysthymic disorder: Secondary | ICD-10-CM

## 2024-03-04 NOTE — Telephone Encounter (Signed)
 ALPRAZOLAM  Last OV: 11/13/23 Next OV: 04/29/24 Last RF: 01/17/24

## 2024-04-29 ENCOUNTER — Ambulatory Visit: Admitting: Family Medicine

## 2024-04-29 ENCOUNTER — Encounter: Payer: Self-pay | Admitting: Family Medicine

## 2024-04-29 VITALS — BP 136/69 | HR 77 | Ht 62.0 in | Wt 158.1 lb

## 2024-04-29 DIAGNOSIS — N182 Chronic kidney disease, stage 2 (mild): Secondary | ICD-10-CM

## 2024-04-29 DIAGNOSIS — F341 Dysthymic disorder: Secondary | ICD-10-CM | POA: Diagnosis not present

## 2024-04-29 DIAGNOSIS — E7849 Other hyperlipidemia: Secondary | ICD-10-CM

## 2024-04-29 DIAGNOSIS — R7301 Impaired fasting glucose: Secondary | ICD-10-CM | POA: Diagnosis not present

## 2024-04-29 DIAGNOSIS — F5101 Primary insomnia: Secondary | ICD-10-CM

## 2024-04-29 DIAGNOSIS — I10 Essential (primary) hypertension: Secondary | ICD-10-CM

## 2024-04-29 DIAGNOSIS — L989 Disorder of the skin and subcutaneous tissue, unspecified: Secondary | ICD-10-CM

## 2024-04-29 MED ORDER — ATORVASTATIN CALCIUM 40 MG PO TABS: 20.0000 mg | ORAL_TABLET | Freq: Every day | ORAL | Status: AC

## 2024-04-29 NOTE — Assessment & Plan Note (Signed)
 On trazodone  PRN.

## 2024-04-29 NOTE — Assessment & Plan Note (Signed)
 Hyperlipidemia On 40 mg cholesterol medication causing muscle pain. Splitting dose to manage side effects. Plans to switch to 20 mg. - Switch to 20 mg cholesterol medication once current supply is exhausted. - Continue dietary modifications for cholesterol management.

## 2024-04-29 NOTE — Assessment & Plan Note (Signed)
 Essential hypertension Blood pressure well-controlled with Atacand  HCT. No chest pain or shortness of breath. - Continue Atacand  HCT as prescribed. - due for labs

## 2024-04-29 NOTE — Assessment & Plan Note (Signed)
 Continue current medication regimen with fluvoxamine  and alprazolam  as needed.

## 2024-04-29 NOTE — Progress Notes (Signed)
 "  Established Patient Office Visit  Patient ID: Tina Meyer, female    DOB: 04-Jun-1960  Age: 64 y.o. MRN: [id number] PCP: Alvan Dorothyann BIRCH, MD  Chief Complaint  Patient presents with   Hypertension   Hyperlipidemia    Pt reports that she began experiencing myalgias so she began cutting her Atorvastatin  40 mg in half and feels better.    ifg   Nevus    Pt has a mole on her chest that has been there for about 1 month that she would like to be checked    Subjective:     HPI  Discussed the use of AI scribe software for clinical note transcription with the patient, who gave verbal consent to proceed.  History of Present Illness The patient presents with concerns about weight gain and dietary habits.  Weight gain and dietary habits - Recent weight gain, currently at her heaviest weight to date - Attributes weight gain to increased consumption of sweets, especially during the holidays - Temptation of sweets at work and in social settings - Actively attempting dietary modifications: reducing sweets, avoiding fast food, and eliminating sodas - Incorporating healthier snacks such as edamame and salads  Physical activity - Walks 8,000 to 10,000 steps daily at work - Considering adding resistance training with available weights and bands at home - Exploring chair yoga to improve balance and strength  Hypertension management - Takes Atacand  HCT for blood pressure control - No adverse effects from antihypertensive medication - Home blood pressure readings range from 117/72 to 130/80 - No chest pain, shortness of breath, or other symptoms related to blood pressure  Myalgias and arthralgias - Experienced significant body pain after cholesterol medication dosage increased to 40 mg - Continues to take 1/2 tab cholesterol medication daily - Occasional knee discomfort, attributed to age or arthritis  New skin lesion on chest - New skin lesion appeared approximately 1.5  months ago after a trip to [place]  - Lesion does not bleed or itch - Concerned about the sudden appearance of the lesion     ROS    Objective:     BP 136/69   Pulse 77   Ht 5' 2 (1.575 m)   Wt 158 lb 1.6 oz (71.7 kg)   SpO2 96%   BMI 28.92 kg/m    Physical Exam Vitals and nursing note reviewed.  Constitutional:      Appearance: Normal appearance.  HENT:     Head: Normocephalic and atraumatic.  Eyes:     Conjunctiva/sclera: Conjunctivae normal.  Cardiovascular:     Rate and Rhythm: Normal rate and regular rhythm.  Pulmonary:     Effort: Pulmonary effort is normal.     Breath sounds: Normal breath sounds.  Skin:    General: Skin is warm and dry.     Comments: Slightly pink raised more shiny appearing lesion on mid upper chest approximately 0.5 x 0.8 cm  Neurological:     Mental Status: She is alert.  Psychiatric:        Mood and Affect: Mood normal.      No results found for any visits on 04/29/24.    The 10-year ASCVD risk score (Arnett DK, et al., 2019) is: 7.5%    Assessment & Plan:   Problem List Items Addressed This Visit       Cardiovascular and Mediastinum   HYPERTENSION, BENIGN - Primary   Essential hypertension Blood pressure well-controlled with Atacand  HCT. No chest pain or shortness of breath. -  Continue Atacand  HCT as prescribed. - due for labs       Relevant Medications   atorvastatin  (LIPITOR) 40 MG tablet   Other Relevant Orders   CBC   Lipid Panel With LDL/HDL Ratio   CMP14+EGFR   HgB A1c     Endocrine   IFG (impaired fasting glucose)   Lab Results  Component Value Date   HGBA1C 5.6 11/13/2023   A1c looks great today continue to work on healthy food choices and regular exercise.      Relevant Orders   CBC   Lipid Panel With LDL/HDL Ratio   CMP14+EGFR   HgB A1c     Genitourinary   CKD (chronic kidney disease) stage 2, GFR 60-89 ml/min   Team to monitor renal function every 6 months.  Will get updated labs  today.      Relevant Orders   CBC   Lipid Panel With LDL/HDL Ratio   CMP14+EGFR   HgB A1c     Other   INSOMNIA   On trazodone  PRN.       Hyperlipidemia   Hyperlipidemia On 40 mg cholesterol medication causing muscle pain. Splitting dose to manage side effects. Plans to switch to 20 mg. - Switch to 20 mg cholesterol medication once current supply is exhausted. - Continue dietary modifications for cholesterol management.      Relevant Medications   atorvastatin  (LIPITOR) 40 MG tablet   Other Relevant Orders   CBC   Lipid Panel With LDL/HDL Ratio   CMP14+EGFR   HgB A1c   ANXIETY DEPRESSION   Continue current medication regimen with fluvoxamine  and alprazolam  as needed.      Other Visit Diagnoses       Skin lesion       Relevant Orders   Ambulatory referral to Dermatology       Assessment and Plan Assessment & Plan Obesity Recent weight gain due to increased sweets and inactivity. Attempting dietary and exercise changes. - Encouraged reducing sweets and fast food. - Recommended healthier snacks like edamame and salads. - Advised reducing soda, opting for water or unsweetened drinks. - Suggested monthly goals for diet and exercise. - Recommended chair yoga for balance and strength. - Advised resistance training twice weekly with home weights or bands.  Suspicious skin lesion  - suspected basal cell carcinoma of skin New skin lesion. Differential includes basal cell carcinoma and seborrheic keratosis. - Referred to dermatologist for evaluation. - Advised monitoring lesion for changes and reporting if it changes.    Return in about 6 months (around 10/27/2024) for bp/ifg.    Dorothyann Byars, MD [facility]   "

## 2024-04-29 NOTE — Assessment & Plan Note (Signed)
 Lab Results  Component Value Date   HGBA1C 5.6 11/13/2023   A1c looks great today continue to work on healthy food choices and regular exercise.

## 2024-04-29 NOTE — Assessment & Plan Note (Signed)
 Team to monitor renal function every 6 months.  Will get updated labs today.

## 2024-04-30 LAB — CBC
Hematocrit: 37.9 % (ref 34.0–46.6)
Hemoglobin: 12.4 g/dL (ref 11.1–15.9)
MCH: 26.8 pg (ref 26.6–33.0)
MCHC: 32.7 g/dL (ref 31.5–35.7)
MCV: 82 fL (ref 79–97)
Platelets: 347 x10E3/uL (ref 150–450)
RBC: 4.62 x10E6/uL (ref 3.77–5.28)
RDW: 13.4 % (ref 11.7–15.4)
WBC: 7 x10E3/uL (ref 3.4–10.8)

## 2024-04-30 LAB — CMP14+EGFR
ALT: 18 IU/L (ref 0–32)
AST: 22 IU/L (ref 0–40)
Albumin: 4.5 g/dL (ref 3.9–4.9)
Alkaline Phosphatase: 99 IU/L (ref 49–135)
BUN/Creatinine Ratio: 20 (ref 12–28)
BUN: 24 mg/dL (ref 8–27)
Bilirubin Total: 0.3 mg/dL (ref 0.0–1.2)
CO2: 22 mmol/L (ref 20–29)
Calcium: 9.6 mg/dL (ref 8.7–10.3)
Chloride: 100 mmol/L (ref 96–106)
Creatinine, Ser: 1.2 mg/dL — ABNORMAL HIGH (ref 0.57–1.00)
Globulin, Total: 2.7 g/dL (ref 1.5–4.5)
Glucose: 98 mg/dL (ref 70–99)
Potassium: 4.2 mmol/L (ref 3.5–5.2)
Sodium: 139 mmol/L (ref 134–144)
Total Protein: 7.2 g/dL (ref 6.0–8.5)
eGFR: 51 mL/min/1.73 — ABNORMAL LOW

## 2024-04-30 LAB — LIPID PANEL WITH LDL/HDL RATIO
Cholesterol, Total: 220 mg/dL — ABNORMAL HIGH (ref 100–199)
HDL: 48 mg/dL
LDL Chol Calc (NIH): 143 mg/dL — ABNORMAL HIGH (ref 0–99)
LDL/HDL Ratio: 3 ratio (ref 0.0–3.2)
Triglycerides: 159 mg/dL — ABNORMAL HIGH (ref 0–149)
VLDL Cholesterol Cal: 29 mg/dL (ref 5–40)

## 2024-04-30 LAB — HEMOGLOBIN A1C
Est. average glucose Bld gHb Est-mCnc: 123 mg/dL
Hgb A1c MFr Bld: 5.9 % — ABNORMAL HIGH (ref 4.8–5.6)

## 2024-05-01 ENCOUNTER — Ambulatory Visit: Payer: Self-pay | Admitting: Family Medicine

## 2024-05-01 NOTE — Progress Notes (Signed)
 Hi Tina Meyer, triglycerides look much better this time.  Though your LDL went up slightly.  Just continue to work on healthy Mediterranean diet and regular exercise shooting for 30 minutes 5 days/week.  Kidney function did trend upward normally your baseline is close to 1 this time it was 1.2 which is little unusual for you.  I do want to recheck that again in about 4 weeks.  Liver enzymes are normal.  A1c is 5.9 in the prediabetes range.  This did trend upward compared to July when you are down to 5.6.  Blood count is normal no sign of anemia or infection.

## 2024-05-13 ENCOUNTER — Ambulatory Visit: Admitting: Family Medicine

## 2024-06-10 ENCOUNTER — Ambulatory Visit: Admitting: Physician Assistant

## 2024-10-28 ENCOUNTER — Ambulatory Visit: Admitting: Family Medicine
